# Patient Record
Sex: Male | Born: 1975 | Race: White | Hispanic: No | Marital: Single | Smoking: Never smoker
Health system: Southern US, Community
[De-identification: ages and names within clinical notes are randomized; demographics above are authoritative.]

## PROBLEM LIST (undated history)

## (undated) DIAGNOSIS — F1010 Alcohol abuse, uncomplicated: Secondary | ICD-10-CM

## (undated) DIAGNOSIS — M726 Necrotizing fasciitis: Secondary | ICD-10-CM

## (undated) DIAGNOSIS — K7460 Unspecified cirrhosis of liver: Secondary | ICD-10-CM

---

## 2011-09-23 DEATH — deceased

## 2018-01-05 ENCOUNTER — Inpatient Hospital Stay (HOSPITAL_COMMUNITY): Payer: PRIVATE HEALTH INSURANCE | Admitting: Certified Registered Nurse Anesthetist

## 2018-01-05 ENCOUNTER — Encounter (HOSPITAL_COMMUNITY)
Admission: AD | Disposition: A | Discharge status: Home Health | Payer: Self-pay | Source: Other Acute Inpatient Hospital | Attending: Orthopedic Surgery

## 2018-01-05 ENCOUNTER — Encounter (HOSPITAL_COMMUNITY): Payer: Self-pay | Admitting: Internal Medicine

## 2018-01-05 ENCOUNTER — Inpatient Hospital Stay (HOSPITAL_COMMUNITY)
Admission: AD | Admit: 2018-01-05 | Discharge: 2018-01-25 | DRG: 853 | Disposition: A | Discharge status: Home Health | Payer: PRIVATE HEALTH INSURANCE | Source: Other Acute Inpatient Hospital | Attending: Orthopedic Surgery | Admitting: Orthopedic Surgery

## 2018-01-05 ENCOUNTER — Inpatient Hospital Stay (HOSPITAL_COMMUNITY): Payer: PRIVATE HEALTH INSURANCE

## 2018-01-05 DIAGNOSIS — R1310 Dysphagia, unspecified: Secondary | ICD-10-CM | POA: Diagnosis present

## 2018-01-05 DIAGNOSIS — E43 Unspecified severe protein-calorie malnutrition: Secondary | ICD-10-CM | POA: Diagnosis present

## 2018-01-05 DIAGNOSIS — L97524 Non-pressure chronic ulcer of other part of left foot with necrosis of bone: Secondary | ICD-10-CM | POA: Diagnosis present

## 2018-01-05 DIAGNOSIS — Z833 Family history of diabetes mellitus: Secondary | ICD-10-CM

## 2018-01-05 DIAGNOSIS — Z885 Allergy status to narcotic agent status: Secondary | ICD-10-CM | POA: Diagnosis not present

## 2018-01-05 DIAGNOSIS — A419 Sepsis, unspecified organism: Secondary | ICD-10-CM | POA: Diagnosis present

## 2018-01-05 DIAGNOSIS — D62 Acute posthemorrhagic anemia: Secondary | ICD-10-CM | POA: Diagnosis not present

## 2018-01-05 DIAGNOSIS — F1010 Alcohol abuse, uncomplicated: Secondary | ICD-10-CM | POA: Diagnosis present

## 2018-01-05 DIAGNOSIS — Z881 Allergy status to other antibiotic agents status: Secondary | ICD-10-CM | POA: Diagnosis not present

## 2018-01-05 DIAGNOSIS — M86179 Other acute osteomyelitis, unspecified ankle and foot: Secondary | ICD-10-CM | POA: Diagnosis not present

## 2018-01-05 DIAGNOSIS — M726 Necrotizing fasciitis: Secondary | ICD-10-CM

## 2018-01-05 DIAGNOSIS — Z6826 Body mass index (BMI) 26.0-26.9, adult: Secondary | ICD-10-CM

## 2018-01-05 DIAGNOSIS — L97519 Non-pressure chronic ulcer of other part of right foot with unspecified severity: Secondary | ICD-10-CM | POA: Diagnosis present

## 2018-01-05 DIAGNOSIS — G629 Polyneuropathy, unspecified: Secondary | ICD-10-CM

## 2018-01-05 DIAGNOSIS — M868X7 Other osteomyelitis, ankle and foot: Secondary | ICD-10-CM | POA: Diagnosis present

## 2018-01-05 HISTORY — PX: I & D EXTREMITY: SHX5045

## 2018-01-05 HISTORY — DX: Other acute osteomyelitis, unspecified ankle and foot: M86.179

## 2018-01-05 LAB — COMPREHENSIVE METABOLIC PANEL
ALBUMIN: 2 g/dL — AB (ref 3.5–5.0)
ALK PHOS: 64 U/L (ref 38–126)
ALT: 35 U/L (ref 17–63)
AST: 51 U/L — AB (ref 15–41)
Anion gap: 10 (ref 5–15)
BUN: 17 mg/dL (ref 6–20)
CALCIUM: 7.7 mg/dL — AB (ref 8.9–10.3)
CO2: 26 mmol/L (ref 22–32)
CREATININE: 0.89 mg/dL (ref 0.61–1.24)
Chloride: 98 mmol/L — ABNORMAL LOW (ref 101–111)
GFR calc Af Amer: >60 mL/min (ref >60)
GLUCOSE: 98 mg/dL (ref 65–99)
POTASSIUM: 4 mmol/L (ref 3.5–5.1)
Sodium: 134 mmol/L — ABNORMAL LOW (ref 135–145)
TOTAL PROTEIN: 6.6 g/dL (ref 6.5–8.1)
Total Bilirubin: 1.5 mg/dL — ABNORMAL HIGH (ref 0.3–1.2)

## 2018-01-05 LAB — GLUCOSE, CAPILLARY
GLUCOSE-CAPILLARY: 102 mg/dL — AB (ref 65–99)
Glucose-Capillary: 89 mg/dL (ref 65–99)

## 2018-01-05 LAB — CBC WITH DIFFERENTIAL/PLATELET
BASOS ABS: 0 10*3/uL (ref 0.0–0.1)
Basophils Relative: 0 %
EOS ABS: 0 10*3/uL (ref 0.0–0.7)
Eosinophils Relative: 0 %
HCT: 35 % — ABNORMAL LOW (ref 39.0–52.0)
Hemoglobin: 11.1 g/dL — ABNORMAL LOW (ref 13.0–17.0)
LYMPHS PCT: 6 %
Lymphs Abs: 0.8 10*3/uL (ref 0.7–4.0)
MCH: 32.2 pg (ref 26.0–34.0)
MCHC: 31.7 g/dL (ref 30.0–36.0)
MCV: 101.4 fL — ABNORMAL HIGH (ref 78.0–100.0)
MONO ABS: 0.8 10*3/uL (ref 0.1–1.0)
Monocytes Relative: 6 %
Neutro Abs: 11.5 10*3/uL — ABNORMAL HIGH (ref 1.7–7.7)
Neutrophils Relative %: 88 %
PLATELETS: 149 10*3/uL — AB (ref 150–400)
RBC: 3.45 MIL/uL — AB (ref 4.22–5.81)
RDW: 14 % (ref 11.5–15.5)
WBC: 13.1 10*3/uL — AB (ref 4.0–10.5)

## 2018-01-05 LAB — TYPE AND SCREEN
ABO/RH(D): A POS
Antibody Screen: NEGATIVE

## 2018-01-05 LAB — TSH: TSH: 2.479 u[IU]/mL (ref 0.350–4.500)

## 2018-01-05 LAB — ABO/RH: ABO/RH(D): A POS

## 2018-01-05 LAB — VANCOMYCIN, TROUGH: Vancomycin Tr: 13 ug/mL — ABNORMAL LOW (ref 15–20)

## 2018-01-05 LAB — VITAMIN B12: VITAMIN B 12: 275 pg/mL (ref 180–914)

## 2018-01-05 SURGERY — IRRIGATION AND DEBRIDEMENT EXTREMITY
Anesthesia: General | Laterality: Left

## 2018-01-05 MED ORDER — LIDOCAINE 2% (20 MG/ML) 5 ML SYRINGE
INTRAMUSCULAR | Status: DC | PRN
  Administered 2018-01-05: 100 mg via INTRAVENOUS

## 2018-01-05 MED ORDER — PHENYLEPHRINE HCL 10 MG/ML IJ SOLN
INTRAMUSCULAR | Status: DC | PRN
  Administered 2018-01-05 (×2): 120 ug via INTRAVENOUS
  Administered 2018-01-05 (×4): 80 ug via INTRAVENOUS
  Administered 2018-01-05: 120 ug via INTRAVENOUS
  Administered 2018-01-05 (×2): 80 ug via INTRAVENOUS

## 2018-01-05 MED ORDER — ALBUMIN HUMAN 5 % IV SOLN
INTRAVENOUS | Status: AC
  Administered 2018-01-05: 12.5 g
  Filled 2018-01-05: qty 250

## 2018-01-05 MED ORDER — SODIUM CHLORIDE 0.9 % IR SOLN
Status: DC | PRN
  Administered 2018-01-05: 9000 mL

## 2018-01-05 MED ORDER — MORPHINE SULFATE (PF) 2 MG/ML IV SOLN
2.0000 mg | INTRAVENOUS | Status: DC | PRN
  Administered 2018-01-05 – 2018-01-08 (×11): 2 mg via INTRAVENOUS
  Filled 2018-01-05 (×11): qty 1

## 2018-01-05 MED ORDER — TETANUS-DIPHTH-ACELL PERTUSSIS 5-2.5-18.5 LF-MCG/0.5 IM SUSP
0.5000 mL | Freq: Once | INTRAMUSCULAR | Status: AC
  Administered 2018-01-05: 0.5 mL via INTRAMUSCULAR
  Filled 2018-01-05: qty 0.5

## 2018-01-05 MED ORDER — ACETAMINOPHEN 325 MG PO TABS
650.0000 mg | ORAL_TABLET | Freq: Four times a day (QID) | ORAL | Status: DC | PRN
  Administered 2018-01-05: 650 mg via ORAL
  Filled 2018-01-05: qty 2

## 2018-01-05 MED ORDER — ONDANSETRON HCL 4 MG/2ML IJ SOLN: 4.0000 mg | Freq: Four times a day (QID) | INTRAMUSCULAR | Status: DC | PRN

## 2018-01-05 MED ORDER — SODIUM CHLORIDE 0.9 % IV SOLN
INTRAVENOUS | Status: AC
  Administered 2018-01-05: 07:00:00 via INTRAVENOUS

## 2018-01-05 MED ORDER — OXYCODONE HCL 5 MG PO TABS
5.0000 mg | ORAL_TABLET | ORAL | Status: DC | PRN
  Administered 2018-01-05: 5 mg via ORAL

## 2018-01-05 MED ORDER — PHENYLEPHRINE HCL 10 MG/ML IJ SOLN
INTRAVENOUS | Status: DC | PRN
  Administered 2018-01-05: 25 ug/min via INTRAVENOUS

## 2018-01-05 MED ORDER — ONDANSETRON HCL 4 MG PO TABS: 4.0000 mg | ORAL_TABLET | Freq: Four times a day (QID) | ORAL | Status: DC | PRN

## 2018-01-05 MED ORDER — HYDROCODONE-ACETAMINOPHEN 5-325 MG PO TABS
1.0000 | ORAL_TABLET | ORAL | Status: DC | PRN
  Administered 2018-01-05: 1 via ORAL
  Administered 2018-01-07: 2 via ORAL
  Filled 2018-01-05 (×2): qty 2
  Filled 2018-01-05: qty 1

## 2018-01-05 MED ORDER — ACETAMINOPHEN 325 MG PO TABS
650.0000 mg | ORAL_TABLET | Freq: Four times a day (QID) | ORAL | Status: DC | PRN
  Administered 2018-01-07: 650 mg via ORAL
  Filled 2018-01-05: qty 2

## 2018-01-05 MED ORDER — EPHEDRINE SULFATE 50 MG/ML IJ SOLN
INTRAMUSCULAR | Status: DC | PRN
  Administered 2018-01-05 (×2): 5 mg via INTRAVENOUS

## 2018-01-05 MED ORDER — PROMETHAZINE HCL 25 MG/ML IJ SOLN: 6.2500 mg | INTRAMUSCULAR | Status: DC | PRN

## 2018-01-05 MED ORDER — POVIDONE-IODINE 10 % EX SWAB: 2.0000 "application " | Freq: Once | CUTANEOUS | Status: DC

## 2018-01-05 MED ORDER — FENTANYL CITRATE (PF) 100 MCG/2ML IJ SOLN
INTRAMUSCULAR | Status: AC
  Filled 2018-01-05: qty 2

## 2018-01-05 MED ORDER — ALBUMIN HUMAN 5 % IV SOLN: 12.5000 g | Freq: Once | INTRAVENOUS | Status: DC

## 2018-01-05 MED ORDER — FENTANYL CITRATE (PF) 250 MCG/5ML IJ SOLN
INTRAMUSCULAR | Status: AC
  Filled 2018-01-05: qty 5

## 2018-01-05 MED ORDER — LACTATED RINGERS IV SOLN
INTRAVENOUS | Status: DC | PRN
  Administered 2018-01-05 (×2): via INTRAVENOUS

## 2018-01-05 MED ORDER — OXYCODONE HCL 5 MG PO TABS
ORAL_TABLET | ORAL | Status: AC
  Filled 2018-01-05: qty 1

## 2018-01-05 MED ORDER — PIPERACILLIN-TAZOBACTAM 3.375 G IVPB
3.3750 g | Freq: Three times a day (TID) | INTRAVENOUS | Status: AC
  Administered 2018-01-05 – 2018-01-24 (×60): 3.375 g via INTRAVENOUS
  Filled 2018-01-05 (×61): qty 50

## 2018-01-05 MED ORDER — FENTANYL CITRATE (PF) 250 MCG/5ML IJ SOLN
INTRAMUSCULAR | Status: DC | PRN
  Administered 2018-01-05 (×2): 25 ug via INTRAVENOUS
  Administered 2018-01-05 (×2): 50 ug via INTRAVENOUS
  Administered 2018-01-05: 100 ug via INTRAVENOUS

## 2018-01-05 MED ORDER — DIPHENHYDRAMINE HCL 12.5 MG/5ML PO ELIX
25.0000 mg | ORAL_SOLUTION | Freq: Every day | ORAL | Status: DC | PRN
  Administered 2018-01-05: 25 mg via ORAL
  Filled 2018-01-05: qty 10

## 2018-01-05 MED ORDER — GABAPENTIN 100 MG PO CAPS
100.0000 mg | ORAL_CAPSULE | Freq: Three times a day (TID) | ORAL | Status: DC
  Administered 2018-01-05 – 2018-01-06 (×3): 100 mg via ORAL
  Filled 2018-01-05 (×3): qty 1

## 2018-01-05 MED ORDER — PROPOFOL 10 MG/ML IV BOLUS
INTRAVENOUS | Status: AC
  Filled 2018-01-05: qty 20

## 2018-01-05 MED ORDER — FENTANYL CITRATE (PF) 100 MCG/2ML IJ SOLN
25.0000 ug | INTRAMUSCULAR | Status: DC | PRN
  Administered 2018-01-05 (×2): 50 ug via INTRAVENOUS

## 2018-01-05 MED ORDER — VANCOMYCIN HCL IN DEXTROSE 1-5 GM/200ML-% IV SOLN
1000.0000 mg | Freq: Three times a day (TID) | INTRAVENOUS | Status: DC
  Administered 2018-01-05 (×3): 1000 mg via INTRAVENOUS
  Filled 2018-01-05 (×4): qty 200

## 2018-01-05 MED ORDER — CYANOCOBALAMIN 1000 MCG/ML IJ SOLN
1000.0000 ug | Freq: Every day | INTRAMUSCULAR | Status: AC
  Administered 2018-01-05 – 2018-01-08 (×3): 1000 ug via INTRAMUSCULAR
  Filled 2018-01-05 (×6): qty 1

## 2018-01-05 MED ORDER — PROPOFOL 10 MG/ML IV BOLUS
INTRAVENOUS | Status: DC | PRN
  Administered 2018-01-05: 200 mg via INTRAVENOUS

## 2018-01-05 MED ORDER — MIDAZOLAM HCL 2 MG/2ML IJ SOLN
INTRAMUSCULAR | Status: AC
  Filled 2018-01-05: qty 2

## 2018-01-05 MED ORDER — ACETAMINOPHEN 650 MG RE SUPP
650.0000 mg | RECTAL | Status: DC | PRN
  Filled 2018-01-05: qty 1

## 2018-01-05 MED ORDER — ONDANSETRON HCL 4 MG/2ML IJ SOLN
INTRAMUSCULAR | Status: DC | PRN
  Administered 2018-01-05: 4 mg via INTRAVENOUS

## 2018-01-05 MED ORDER — 0.9 % SODIUM CHLORIDE (POUR BTL) OPTIME
TOPICAL | Status: DC | PRN
  Administered 2018-01-05: 1000 mL

## 2018-01-05 MED ORDER — CHLORHEXIDINE GLUCONATE 4 % EX LIQD: 60.0000 mL | Freq: Once | CUTANEOUS | Status: DC

## 2018-01-05 MED ORDER — ACETAMINOPHEN 650 MG RE SUPP: 650.0000 mg | Freq: Four times a day (QID) | RECTAL | Status: DC | PRN

## 2018-01-05 MED ORDER — MIDAZOLAM HCL 2 MG/2ML IJ SOLN
INTRAMUSCULAR | Status: DC | PRN
  Administered 2018-01-05: 2 mg via INTRAVENOUS

## 2018-01-05 MED ORDER — VANCOMYCIN HCL 10 G IV SOLR
1250.0000 mg | Freq: Three times a day (TID) | INTRAVENOUS | Status: DC
  Administered 2018-01-06 – 2018-01-10 (×13): 1250 mg via INTRAVENOUS
  Filled 2018-01-05 (×16): qty 1250

## 2018-01-05 MED ORDER — IOPAMIDOL (ISOVUE-300) INJECTION 61%
INTRAVENOUS | Status: AC
  Administered 2018-01-05: 75 mL
  Filled 2018-01-05: qty 75

## 2018-01-05 SURGICAL SUPPLY — 47 items
BNDG COHESIVE 4X5 TAN STRL (GAUZE/BANDAGES/DRESSINGS) IMPLANT
BNDG ELASTIC 6X10 VLCR STRL LF (GAUZE/BANDAGES/DRESSINGS) ×3 IMPLANT
BNDG GAUZE ELAST 4 BULKY (GAUZE/BANDAGES/DRESSINGS) IMPLANT
BRUSH SCRUB SURG 4.25 DISP (MISCELLANEOUS) IMPLANT
CANISTER WOUND CARE 500ML ATS (WOUND CARE) ×3 IMPLANT
CHLORAPREP W/TINT 26ML (MISCELLANEOUS) ×6 IMPLANT
COVER MAYO STAND STRL (DRAPES) IMPLANT
COVER SURGICAL LIGHT HANDLE (MISCELLANEOUS) ×3 IMPLANT
DRAPE ORTHO SPLIT 77X108 STRL (DRAPES) ×2
DRAPE SURG 17X23 STRL (DRAPES) ×3 IMPLANT
DRAPE SURG ORHT 6 SPLT 77X108 (DRAPES) ×1 IMPLANT
DRAPE U-SHAPE 47X51 STRL (DRAPES) ×3 IMPLANT
DRSG ADAPTIC 3X8 NADH LF (GAUZE/BANDAGES/DRESSINGS) IMPLANT
DRSG VAC ATS LRG SENSATRAC (GAUZE/BANDAGES/DRESSINGS) ×3 IMPLANT
ELECT REM PT RETURN 9FT ADLT (ELECTROSURGICAL)
ELECTRODE REM PT RTRN 9FT ADLT (ELECTROSURGICAL) IMPLANT
EVACUATOR 1/8 PVC DRAIN (DRAIN) IMPLANT
GAUZE SPONGE 4X4 12PLY STRL (GAUZE/BANDAGES/DRESSINGS) IMPLANT
GLOVE BIO SURGEON STRL SZ7.5 (GLOVE) ×12 IMPLANT
GLOVE BIOGEL PI IND STRL 7.5 (GLOVE) ×1 IMPLANT
GLOVE BIOGEL PI INDICATOR 7.5 (GLOVE) ×2
GOWN STRL REUS W/ TWL LRG LVL3 (GOWN DISPOSABLE) ×2 IMPLANT
GOWN STRL REUS W/TWL LRG LVL3 (GOWN DISPOSABLE) ×4
HANDPIECE INTERPULSE COAX TIP (DISPOSABLE)
KIT BASIN OR (CUSTOM PROCEDURE TRAY) ×3 IMPLANT
KIT ROOM TURNOVER OR (KITS) ×3 IMPLANT
MANIFOLD NEPTUNE II (INSTRUMENTS) ×3 IMPLANT
NS IRRIG 1000ML POUR BTL (IV SOLUTION) ×3 IMPLANT
PACK ORTHO EXTREMITY (CUSTOM PROCEDURE TRAY) ×3 IMPLANT
PAD ARMBOARD 7.5X6 YLW CONV (MISCELLANEOUS) ×6 IMPLANT
PADDING CAST COTTON 6X4 STRL (CAST SUPPLIES) IMPLANT
SET HNDPC FAN SPRY TIP SCT (DISPOSABLE) IMPLANT
SPONGE LAP 18X18 5 PK (GAUZE/BANDAGES/DRESSINGS) ×12 IMPLANT
SPONGE LAP 18X18 X RAY DECT (DISPOSABLE) ×9 IMPLANT
SUT ETHILON 2 0 FS 18 (SUTURE) IMPLANT
SUT ETHILON 2 0 PSLX (SUTURE) ×6 IMPLANT
SUT ETHILON 3 0 PS 1 (SUTURE) IMPLANT
SUT MON AB 2-0 CT1 36 (SUTURE) IMPLANT
SUT PDS AB 0 CT 36 (SUTURE) IMPLANT
SWAB CULTURE ESWAB REG 1ML (MISCELLANEOUS) IMPLANT
TOWEL OR 17X24 6PK STRL BLUE (TOWEL DISPOSABLE) ×3 IMPLANT
TOWEL OR 17X26 10 PK STRL BLUE (TOWEL DISPOSABLE) ×6 IMPLANT
TUBE CONNECTING 12'X1/4 (SUCTIONS) ×1
TUBE CONNECTING 12X1/4 (SUCTIONS) ×2 IMPLANT
UNDERPAD 30X30 (UNDERPADS AND DIAPERS) ×3 IMPLANT
WATER STERILE IRR 1000ML POUR (IV SOLUTION) IMPLANT
YANKAUER SUCT BULB TIP NO VENT (SUCTIONS) ×3 IMPLANT

## 2018-01-05 NOTE — Progress Notes (Signed)
seens examined In addition  42 yr old with habitual ETOH, ?neuropathy nos Dysphagia [facility] Nec Fasciitis Went for surgery today and will be going again probably on Monday 3/18  Awake alert pleasant no distres Wound va cin place s1 s 2no m/r/g abd soft nt nd no rebound no guard   P Neuropathy ? Related to habitual ETOH--will dose with high dose b12, start gabapentin 100 tid and observe He also c/o gagging sensation in the throat--if persists will order barium esophogram and evalauate for stricture-d/c Dr. Barbaraann BarthelHaddix--if stable and primarily Ortho issues, would follow as consult with ortho being primary--appreciate input  Pleas KochJai Reichen Hutzler, MD [facility] [phone]) [phone]

## 2018-01-05 NOTE — Care Management Note (Addendum)
Case Management Note  Patient Details  Name: Aaron Mcconnell MRN: [id number] Date of Birth: 10/13/1976  Subjective/Objective:                 Patient from home, transferred from another facility, lives alone, works full time and independent. Extensive I&D to LLE, with more surgery possible per patient. Has wound VAC. No DME at home. Anticipate DME and HH needs. Patient had appointment with [facility]" to establish PCP. He states they also make house calls.  [place] in [place], [place].  Patient has Rx coverage and [employer] care.   Action/Plan:  CM will continue to follow.   Expected Discharge Date:                  Expected Discharge Plan:  Home w Home Health Services  In-House Referral:     Discharge planning Services  CM Consult  Post Acute Care Choice:    Choice offered to:     DME Arranged:    DME Agency:     HH Arranged:    HH Agency:     Status of Service:  In process, will continue to follow  If discussed at Long Length of Stay Meetings, dates discussed:    Additional Comments:  Aaron SabalDebbie Hansika Leaming, RN 01/05/2018, 3:22 PM

## 2018-01-05 NOTE — H&P (Signed)
History and Physical    Aaron Mcconnell WJX:[id number] DOB: [birth year] DOA: 01/05/2018  PCP: Patient, No Pcp Per  Patient coming from: Transferred from [facility].  Chief Complaint: Bilateral foot wound.  HPI: Aaron Mcconnell is a 42 y.o. male with no significant past medical history he started developing wound on his foot over the last 3-4 weeks.  Had mild discharge but became more acute over the last 24 hours with fever and chills.  Patient started to notice bloody discharge from the foot.  Patient also has been having ulceration on the both feet.  Patient otherwise denies any chest pain shortness of breath productive cough nausea vomiting or diarrhea.  Patient had gone to [facility] ER and over the x-rays done of both feet shows features concerning for osteomyelitis of the left foot second toe and bilateral necrotizing fasciitis.  Labs also show leukocytosis.  Hemoglobin A1c was 5.5.  Blood cultures were obtained.  Patient was started empirically on vancomycin and Zosyn.  Dr. Jena Mcconnell on-call orthopedic surgeon at [facility] was consulted and patient was accepted to the hospital.  On my exam patient looks only redressed up and patient's heart rate is around 90 blood pressure is more than 100 systolic patient is to be mildly febrile.  ED Course: Patient was a direct admit.  Review of Systems: As per HPI, rest all negative.   History reviewed. No pertinent past medical history.  History reviewed. No pertinent surgical history.   reports that  has never smoked. he has never used smokeless tobacco. He reports that he drinks alcohol. He reports that he does not use drugs.  Not on File  Family History  Problem Relation Age of Onset  . Diabetes Mellitus II Mother     Prior to Admission medications   Not on File    Physical Exam: Vitals:   01/05/18 0415  BP: 120/81  Pulse: 91  Resp: 18  Temp: 100.2 F (37.9 C)  TempSrc: Oral  SpO2: 99%      Constitutional:  Moderately built and nourished. Vitals:   01/05/18 0415  BP: 120/81  Pulse: 91  Resp: 18  Temp: 100.2 F (37.9 C)  TempSrc: Oral  SpO2: 99%   Eyes: Anicteric no pallor. ENMT: No discharge from the ears eyes nose or mouth. Neck: No mass felt.  No neck rigidity. Respiratory: No rhonchi or crepitations. Cardiovascular: S1-S2 heard no murmurs appreciated. Abdomen: Soft nontender bowel sounds present. Musculoskeletal: Swelling in the left ankle area with mild erythema.  Both feet are dressed. Skin: Mild erythema of the left ankle area. Neurologic: Alert awake oriented to time place and person.  Moves all extremities. Psychiatric: Appears normal.   Labs on Admission: I have personally reviewed following labs and imaging studies  CBC: No results for input(s): WBC, NEUTROABS, HGB, HCT, MCV, PLT in the last 168 hours. Basic Metabolic Panel: No results for input(s): NA, K, CL, CO2, GLUCOSE, BUN, CREATININE, CALCIUM, MG, PHOS in the last 168 hours. GFR: CrCl cannot be calculated (No order found.). Liver Function Tests: No results for input(s): AST, ALT, ALKPHOS, BILITOT, PROT, ALBUMIN in the last 168 hours. No results for input(s): LIPASE, AMYLASE in the last 168 hours. No results for input(s): AMMONIA in the last 168 hours. Coagulation Profile: No results for input(s): INR, PROTIME in the last 168 hours. Cardiac Enzymes: No results for input(s): CKTOTAL, CKMB, CKMBINDEX, TROPONINI in the last 168 hours. BNP (last 3 results) No results for input(s): PROBNP in the  last 8760 hours. HbA1C: No results for input(s): HGBA1C in the last 72 hours. CBG: No results for input(s): GLUCAP in the last 168 hours. Lipid Profile: No results for input(s): CHOL, HDL, LDLCALC, TRIG, CHOLHDL, LDLDIRECT in the last 72 hours. Thyroid Function Tests: No results for input(s): TSH, T4TOTAL, FREET4, T3FREE, THYROIDAB in the last 72 hours. Anemia Panel: No results for input(s): VITAMINB12, FOLATE,  FERRITIN, TIBC, IRON, RETICCTPCT in the last 72 hours. Urine analysis: No results found for: COLORURINE, APPEARANCEUR, LABSPEC, PHURINE, GLUCOSEU, HGBUR, BILIRUBINUR, KETONESUR, PROTEINUR, UROBILINOGEN, NITRITE, LEUKOCYTESUR Sepsis Labs: @LABRCNTIP (procalcitonin:4,lacticidven:4) )No results found for this or any previous visit (from the past 240 hour(s)).   Radiological Exams on Admission: No results found.    Assessment/Plan Principal Problem:   Sepsis (HCC) Active Problems:   Osteomyelitis of ankle or foot, acute, unspecified laterality (HCC)    1. Sepsis with osteomyelitis of the feet with possible necrotizing fasciitis -discussed with Dr. Jena GaussHaddix on call orthopedic surgeon, who will be seeing patient in consult.  Patient is kept n.p.o. in anticipation of possible procedure.  Patient is continued on vancomycin and Zosyn IV fluids Tylenol rectally,morphine for pain relief.  [facility] and will need to get results from there.  Labs done over here are still pending.  Patient does not recall his last shot which has been ordered now.  I have extensively reviewed [facility]'s [facility].  Discussed with on-call orthopedic surgeon.   DVT prophylaxis: For now patient has wounds on both legs unable to place SCDs and no Lovenox in anticipation of surgery. Code Status: Full code. Family Communication: Patient's mother. Disposition Plan: Home. Consults called: Orthopedics. Admission status: Inpatient.   Aaron ClosArshad N Gavan Nordby MD [facility] Pager [phone].  If 7PM-7AM, please contact night-coverage [email or web address] Password [email or web address]  01/05/2018, 7:21 AM

## 2018-01-05 NOTE — Progress Notes (Signed)
[facility] has [facility] along with his mother at 0410 this morning.  Patient's mother is asking for packet that she claim the transporters had during the travel.  Packet was not provided.  Particia Nearingontacted Jill, RN at [facility] [place] - [phone].  Information will be faxed to [facility]N this morning.  Patient and his mother is concerned regarding insurance not paying for any duplicate services.  Patient had blood work, chest x ray, etc done [facility].  They do not want to duplicate these test [facility] at [facility].  Contacting on-MD.

## 2018-01-05 NOTE — H&P (View-Only) (Signed)
Orthopaedic Trauma Service (OTS) Consult   Patient ID: Aaron Mcconnell MRN: [id number] DOB/AGE: [birth year] 42 y.o.  Reason for Consult:Left leg infection Referring Physician: Dr. Midge Minium, MD [facility]  HPI: Aaron Mcconnell is an 42 y.o. male who is being seen in consultation at the request of Dr. Toniann Fail for evaluation of left leg infection.  This is a otherwise healthy male with no medical history but he has had wounds and ulcers on both of his legs for multiple weeks.  Over the last few days he has noted increased bloody discharge from the lateral side of the foot.  He developed a fever and chills.  He was brought to [facility] rocking him and x-rays show possible osteomyelitis and bilateral necrotizing fasciitis with gas.  I was consulted and requested admission to hospitalist service.  He only arrived a few hours ago.  The patient is mildly febrile.  He is not hypotensive and he is not tachycardic currently.  He is on right side has an ulcer over his third metatarsal head but without any infection findings.  History reviewed. No pertinent past medical history.  History reviewed. No pertinent surgical history.  Family History  Problem Relation Age of Onset  . Diabetes Mellitus II Mother     Social History:  reports that  has never smoked. he has never used smokeless tobacco. He reports that he drinks alcohol. He reports that he does not use drugs.  Allergies: Not on File  Medications:  No current facility-administered medications on file prior to encounter.    No current outpatient medications on file prior to encounter.    ROS: Constitutional: +Fever Vision: No changes in vision ENT: No difficulty swallowing CV: No chest pain Pulm: No SOB or wheezing GI: No nausea or vomiting GU: No urgency or inability to hold urine Skin: No poor wound healing Neurologic: No numbness or tingling Psychiatric: No depression or anxiety Heme: No bruising Allergic: No  reaction to medications or food   Exam: Blood pressure 120/81, pulse 91, temperature 100.2 F (37.9 C), temperature source Oral, resp. rate 18, SpO2 99 %. General: No acute distress Orientation: Awake alert and oriented x3 Mood and Affect: Cooperative and pleasant Gait: Not able to be assessed due to his infection Coordination and balance: Within normal limits  Left lower extremity: Reveals a full-thickness ulcer on his medial side of his calcaneus.  It tracks to bone.  Laterally there is a significant blistering and bullae that was debrided at bedside.  There is foul-smelling purulent drainage with necrotic tissue that tracks all the way to bone.  There is palpable crepitus consistent with soft tissue gas in his lower extremity that extends up to about mid calf.  He has some swelling without significant erythema on his lower leg.  He endorses sensation but it is diminished.  He has an ulcer on his second toe on the left side.  He has 2+ DP pulses.  No instability of the leg  Right lower extremity: Reveals healthy-appearing ulcer on the base of his third metatarsal head.  There does not track to bone from my view.  He has 2+ DP pulses.  Sensation is diminished.  No signs of cellulitis or infection.     Medical Decision Making: Imaging: I am unable to pull up his films from outside.  However I ordered a stat CT scan of his leg to evaluate the involvement of the soft tissue and bone.  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  He is clinical exam is concerning regarding the involvement around his calcaneus.  I will plan to perform an urgent irrigation and debridement with wound VAC placement.  I discussed with him the possibility needing a below-knee amputation in the near future as the soft tissue involvement is extensive.  He currently is a leukocytosis and febrile but he has stable heart rate and blood pressure.  Continue broad-spectrum antibiotics I will take cultures in the operating room and further evaluate care after that moment.  Risks and benefits were discussed with the patient and his mother.  We will plan to proceed with surgery this morning.   Roby LoftsKevin P. Satomi Buda, MD [facility] [phone] (phone)

## 2018-01-05 NOTE — Op Note (Signed)
OrthopaedicSurgeryOperativeNote (ZOX:[id number]:[id number]) Date of Surgery: 01/05/2018  Admit Date: 01/05/2018   Diagnoses: Pre-Op Diagnoses: Left leg necrotizing fascitis  Post-Op Diagnosis: Same  Procedures: 1. CPT 27894-Decompression and extensive debridement of left lower leg 2. CPT 97605-Wound vac placement  Surgeons: Primary: Haddix, Gillie MannersKevin P, MD   Location:[facility] OR ROOM 03   AnesthesiaGeneral   Antibiotics:Vanc and Zosyn on floor  Tourniquettime:None  EstimatedBloodLoss:26750mL   Complications:* No complications entered in OR log *  Specimens: ID Type Source Tests Collected by Time Destination  A : necrotizing Fascitis Tissue Leg, Left GRAM STAIN, AEROBIC/ANAEROBIC CULTURE (SURGICAL/DEEP WOUND) Haddix, Gillie MannersKevin P, MD 01/05/2018 1100     Implants: * No implants in log *  IndicationsforSurgery: This is a 42 year old male with no known medical history who has had chronic ulcers on bilateral lower extremities for the last few months.  Over the last 2 days he has had worsening pain and swelling on his left leg.  He developed a blister yesterday morning and is progressed significantly.  He has had drainage and a foul-smelling odor.  He went to the emergency room where they evaluated him and x-rays showed gas in the subcutaneous tissue tracking up into his lower leg he was transferred overnight for evaluation.  I evaluated and felt that he had developing necrotizing fasciitis.  I discussed with him the risks and benefits of proceeding with urgent irrigation and debridement and possible amputation.  He was adamant about refusing an amputation right away.  I discussed with him the risks and benefits of surgery and the dire situation of his infection.  The patient agreed to proceed with surgery and consent was obtained.  Operative Findings: Foul-smelling necrotic wound over the lateral heel that was debrided down to expose calcaneus.  There was dishwater pus that tracked through his  fascial planes between the deep posterior and superficial posterior as well as his lateral and anterior compartments all the way up to his knee.  There was notable necrotic muscle in his deep posterior and his lateral compartment.  This was debrided thoroughly.  Wound VAC placement.  Procedure: The patient was identified in the preoperative holding area. Consent was confirmed with the patient and their family and all questions were answered. The operative extremity was marked after confirmation with the patient. he was then brought back to the operating room by our anesthesia colleagues.  He was carefully transferred over to a regular OR table.  Here a bump was placed under his operative hip.  He was placed under general anesthetic. The operative extremity was then prepped and draped in usual sterile fashion. A preoperative timeout was performed to verify the patient, the procedure, and the extremity.  I first started out with the necrotic area around his lateral heel.  Using a scalpel excisionally debrided the skin and subcutaneous tissue debriding the entirety of the necrotic tissue and I sent this for culture.  This tracked down to the calcaneus.  There was a tract from his lateral wound to a chronic ulcer on the medial side.  I used a curette, ronguer and scissors to excisionally debride the necrotic tissue down to healthy bleeding base.  I excised the unhealthy skin around the the wound.  I then incised in a posterior lateral incision all the way up to his fibular head.  Here I encountered the soft tissue and fascial planes were disrupted by the infection.  I encountered dishwater pus in between the fascial planes.  The skin lifted off from the underlying fascia very  easily.  There was a necrotic muscle visible in the posterior deep compartment as well as the lateral compartment.  I debrided some of the peroneal musculature as well as some deep flexor musculature.  I was able to get back to a decent  muscular base with no more necrotic tissue.  I did use cautery to identify that the remaining muscle was contractile.  I cauterized all the bleeders.  I then thoroughly irrigated the wound with 9 L of normal saline.  I then changed instruments and gloves and then proceeded to place a wound VAC with retention sutures on the skin.  I connected this to 125 mmHg and wrapped his leg with an Ace wrap.  I got a good seal.  The patient was then awoken from anesthesia and taken to the PACU in stable condition.  Post Op Plan/Instructions: The patient will be nonweightbearing to the left lower extremity.  We will keep his wound VAC in place.  He will be on broad-spectrum antibiotics.  Will return to the operating room on Monday for repeat washout and debridement.  He will be on aspirin for DVT prophylaxis.  I was present and performed the entire surgery.  Truitt Merle, MD [facility]

## 2018-01-05 NOTE — Progress Notes (Signed)
Pt wound vac is full and is no longer suctioning, RN called OR and tried to get a canister but the OR stated they only had two left and could not give us one. [facility] she said she will call us back. RN called and notified MD

## 2018-01-05 NOTE — Anesthesia Preprocedure Evaluation (Addendum)
Anesthesia Evaluation  Patient identified by MRN, date of birth, ID band Patient awake  General Assessment Comment:Allergies: Erythromycin  Reviewed: Allergy & Precautions, NPO status , Patient's Chart, lab work & pertinent test results  Airway Mallampati: II  TM Distance: >3 FB Neck ROM: Full    Dental  (+) Teeth Intact, Dental Advisory Given   Pulmonary neg pulmonary ROS,    Pulmonary exam normal breath sounds clear to auscultation       Cardiovascular Exercise Tolerance: Good negative cardio ROS Normal cardiovascular exam Rhythm:Regular Rate:Normal     Neuro/Psych negative neurological ROS  negative psych ROS   GI/Hepatic negative GI ROS, Neg liver ROS,   Endo/Other  negative endocrine ROS  Renal/GU negative Renal ROS     Musculoskeletal negative musculoskeletal ROS (+) Left leg infection    Abdominal   Peds  Hematology  (+) Blood dyscrasia (Thrombocytopenia), anemia ,   Anesthesia Other Findings Day of surgery medications reviewed with the patient.  Reproductive/Obstetrics                            Anesthesia Physical Anesthesia Plan  ASA: I  Anesthesia Plan: General   Post-op Pain Management:    Induction: Intravenous  PONV Risk Score and Plan: 2 and Dexamethasone and Ondansetron  Airway Management Planned: LMA  Additional Equipment:   Intra-op Plan:   Post-operative Plan: Extubation in OR  Informed Consent: I have reviewed the patients History and Physical, chart, labs and discussed the procedure including the risks, benefits and alternatives for the proposed anesthesia with the patient or authorized representative who has indicated his/her understanding and acceptance.   Dental advisory given  Plan Discussed with: CRNA  Anesthesia Plan Comments:         Anesthesia Quick Evaluation

## 2018-01-05 NOTE — Consult Note (Signed)
Orthopaedic Trauma Service (OTS) Consult   Patient ID: Aaron Mcconnell MRN: [id number] DOB/AGE: 12/25/1975 42 y.o.  Reason for Consult:Left leg infection Referring Physician: Dr. Arshad Kakrakandy, MD [facility]  HPI: Aaron Mcconnell is an 42 y.o. male who is being seen in consultation at the request of Dr. Kakrakandy for evaluation of left leg infection.  This is a otherwise healthy male with no medical history but he has had wounds and ulcers on both of his legs for multiple weeks.  Over the last few days he has noted increased bloody discharge from the lateral side of the foot.  He developed a fever and chills.  He was brought to [facility] rocking him and x-rays show possible osteomyelitis and bilateral necrotizing fasciitis with gas.  I was consulted and requested admission to hospitalist service.  He only arrived a few hours ago.  The patient is mildly febrile.  He is not hypotensive and he is not tachycardic currently.  He is on right side has an ulcer over his third metatarsal head but without any infection findings.  History reviewed. No pertinent past medical history.  History reviewed. No pertinent surgical history.  Family History  Problem Relation Age of Onset  . Diabetes Mellitus II Mother     Social History:  reports that  has never smoked. he has never used smokeless tobacco. He reports that he drinks alcohol. He reports that he does not use drugs.  Allergies: Not on File  Medications:  No current facility-administered medications on file prior to encounter.    No current outpatient medications on file prior to encounter.    ROS: Constitutional: +Fever Vision: No changes in vision ENT: No difficulty swallowing CV: No chest pain Pulm: No SOB or wheezing GI: No nausea or vomiting GU: No urgency or inability to hold urine Skin: No poor wound healing Neurologic: No numbness or tingling Psychiatric: No depression or anxiety Heme: No bruising Allergic: No  reaction to medications or food   Exam: Blood pressure 120/81, pulse 91, temperature 100.2 F (37.9 C), temperature source Oral, resp. rate 18, SpO2 99 %. General: No acute distress Orientation: Awake alert and oriented x3 Mood and Affect: Cooperative and pleasant Gait: Not able to be assessed due to his infection Coordination and balance: Within normal limits  Left lower extremity: Reveals a full-thickness ulcer on his medial side of his calcaneus.  It tracks to bone.  Laterally there is a significant blistering and bullae that was debrided at bedside.  There is foul-smelling purulent drainage with necrotic tissue that tracks all the way to bone.  There is palpable crepitus consistent with soft tissue gas in his lower extremity that extends up to about mid calf.  He has some swelling without significant erythema on his lower leg.  He endorses sensation but it is diminished.  He has an ulcer on his second toe on the left side.  He has 2+ DP pulses.  No instability of the leg  Right lower extremity: Reveals healthy-appearing ulcer on the base of his third metatarsal head.  There does not track to bone from my view.  He has 2+ DP pulses.  Sensation is diminished.  No signs of cellulitis or infection.     Medical Decision Making: Imaging: I am unable to pull up his films from outside.  However I ordered a stat CT scan of his leg to evaluate the involvement of the soft tissue and bone.  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  He is clinical exam is concerning regarding the involvement around his calcaneus.  I will plan to perform an urgent irrigation and debridement with wound VAC placement.  I discussed with him the possibility needing a below-knee amputation in the near future as the soft tissue involvement is extensive.  He currently is a leukocytosis and febrile but he has stable heart rate and blood pressure.  Continue broad-spectrum antibiotics I will take cultures in the operating room and further evaluate care after that moment.  Risks and benefits were discussed with the patient and his mother.  We will plan to proceed with surgery this morning.   Averyana Pillars P. Zymiere Trostle, MD [facility] [phone] (phone)   

## 2018-01-05 NOTE — Progress Notes (Signed)
Pharmacy Antibiotic Note  Aaron Mcconnell is a 42 y.o. male admitted on 01/05/2018 with sepsis.  Pharmacy has been consulted for Vancomycin/Zosyn dosing. [facility] Vancomycin 1000 mg IV x 1, Zosyn 3.375g IV x 1, and Clindamycin 900 mg IV x 1. Selected labs below.   3/16: pt is now s/p OR for I&D of extensive lower extremity necrotizing fascitis, vancomycin trough is sub-therapeutic at 13 this PM, would like higher end of range in setting of nec fasc.   Plan: Inc vancomycin to 1250 mg IV q8h Re-check trough prior to 4th dose of this regimen    Weight: ~99 kg (per pt)  Temp (24hrs), Avg:98.4 F (36.9 C), Min:97.4 F (36.3 C), Max:100.2 F (37.9 C)  Recent Labs  Lab 01/05/18 0628 01/05/18 2145  WBC 13.1*  --   CREATININE 0.89  --   VANCOTROUGH  --  13*    CrCl cannot be calculated (Unknown ideal weight.).    Allergies  Allergen Reactions  . Erythromycin Itching  . Oxycodone Itching    Aaron Mcconnell, Aaron Mcconnell 01/05/2018 10:27 PM

## 2018-01-05 NOTE — Progress Notes (Signed)
MD told RN to order Hydrocodone 5-10mg  Q4 Prn

## 2018-01-05 NOTE — Anesthesia Procedure Notes (Signed)
Procedure Name: LMA Insertion Date/Time: 01/05/2018 10:32 AM Performed by: Dairl PonderJiang, Katty Fretwell, CRNA Pre-anesthesia Checklist: Patient identified, Emergency Drugs available, Suction available, Patient being monitored and Timeout performed Patient Re-evaluated:Patient Re-evaluated prior to induction Oxygen Delivery Method: Circle system utilized Preoxygenation: Pre-oxygenation with 100% oxygen Induction Type: IV induction LMA: LMA inserted LMA Size: 4.0 Number of attempts: 1 Placement Confirmation: positive ETCO2 and breath sounds checked- equal and bilateral Tube secured with: Tape Dental Injury: Teeth and Oropharynx as per pre-operative assessment

## 2018-01-05 NOTE — Progress Notes (Signed)
RN called MD about pt increased drainage in wound vac MD stated that he is not worried about it we will continue to monitor. Secretary called OR and they only have 1 wound vac left they will call [place] to try and get more. AC notified

## 2018-01-05 NOTE — Anesthesia Postprocedure Evaluation (Signed)
Anesthesia Post Note  Patient: Aaron Mcconnell  Procedure(s) Performed: IRRIGATION AND DEBRIDEMENT EXTREMITY (Left )     Patient location during evaluation: PACU Anesthesia Type: General Level of consciousness: awake and alert Pain management: pain level controlled Vital Signs Assessment: post-procedure vital signs reviewed and stable Respiratory status: spontaneous breathing, nonlabored ventilation and respiratory function stable Cardiovascular status: blood pressure returned to baseline and stable Postop Assessment: no apparent nausea or vomiting Anesthetic complications: no    Last Vitals:  Vitals:   01/05/18 1331 01/05/18 1345  BP: (!) 105/59 105/64  Pulse: 73 82  Resp: (!) 21 18  Temp: (!) 36.3 C (!) 36.3 C  SpO2: 100% 98%    Last Pain:  Vitals:   01/05/18 1345  TempSrc: Oral  PainSc:                  Cecile HearingStephen Edward Turk

## 2018-01-05 NOTE — Progress Notes (Signed)
Patient arrived to PACU on Neo to maintain blood pressures. Will continue to monitor and wean off Neo drip as able.

## 2018-01-05 NOTE — Progress Notes (Signed)
Pharmacy Antibiotic Note  Aaron Mcconnell is a 42 y.o. male admitted on 01/05/2018 with sepsis.  Pharmacy has been consulted for Vancomycin/Zosyn dosing. [facility] Vancomycin 1000 mg IV x 1, Zosyn 3.375g IV x 1, and Clindamycin 900 mg IV x 1. Selected labs below.   Plan: Vancomycin 1000 mg IV q8h starting now Zosyn 3.375G IV q8h to be infused over 4 hours starting now Trend WBC, temp, renal function  F/U infectious work-up Drug levels as indicated  [facility] Scr 0.91 WBC 18.4  Pt wt: ~99 kg (per pt)  Aaron Mcconnell, Aaron Mcconnell 01/05/2018 6:06 AM

## 2018-01-05 NOTE — Transfer of Care (Signed)
Immediate Anesthesia Transfer of Care Note  Patient: Aaron Mcconnell  Procedure(s) Performed: IRRIGATION AND DEBRIDEMENT EXTREMITY (Left )  Patient Location: PACU  Anesthesia Type:General  Level of Consciousness: awake, alert  and oriented  Airway & Oxygen Therapy: Patient Spontanous Breathing  Post-op Assessment: Report given to RN and Post -op Vital signs reviewed and stable  Post vital signs: Reviewed and stable  Last Vitals:  Vitals:   01/05/18 1152 01/05/18 1200  BP: 106/72 97/68  Pulse: 83 73  Resp: 20 18  Temp: 36.6 C   SpO2: 98% 99%    Last Pain:  Vitals:   01/05/18 1200  TempSrc:   PainSc: 8       Patients Stated Pain Goal: 0 (01/05/18 0751)  Complications: No apparent anesthesia complications

## 2018-01-05 NOTE — Progress Notes (Signed)
Pt stated that Oxycodone makes you itch does not want to take it. Will ask doctor for something else for pain.

## 2018-01-06 ENCOUNTER — Encounter (HOSPITAL_COMMUNITY): Payer: Self-pay | Admitting: Student

## 2018-01-06 LAB — BASIC METABOLIC PANEL
Anion gap: 8 (ref 5–15)
BUN: 13 mg/dL (ref 6–20)
CHLORIDE: 100 mmol/L — AB (ref 101–111)
CO2: 27 mmol/L (ref 22–32)
CREATININE: 0.77 mg/dL (ref 0.61–1.24)
Calcium: 7.4 mg/dL — ABNORMAL LOW (ref 8.9–10.3)
GFR calc non Af Amer: >60 mL/min (ref >60)
Glucose, Bld: 84 mg/dL (ref 65–99)
Potassium: 3.6 mmol/L (ref 3.5–5.1)
Sodium: 135 mmol/L (ref 135–145)

## 2018-01-06 LAB — RETICULOCYTES
RBC.: 2.66 MIL/uL — AB (ref 4.22–5.81)
RETIC CT PCT: 0.8 % (ref 0.4–3.1)
Retic Count, Absolute: 21.3 10*3/uL (ref 19.0–186.0)

## 2018-01-06 LAB — RAPID URINE DRUG SCREEN, HOSP PERFORMED
Amphetamines: NOT DETECTED
BARBITURATES: NOT DETECTED
Benzodiazepines: POSITIVE — AB
COCAINE: NOT DETECTED
OPIATES: POSITIVE — AB
TETRAHYDROCANNABINOL: NOT DETECTED

## 2018-01-06 LAB — VITAMIN B12

## 2018-01-06 LAB — GLUCOSE, CAPILLARY
GLUCOSE-CAPILLARY: 93 mg/dL (ref 65–99)
GLUCOSE-CAPILLARY: 75 mg/dL (ref 65–99)
Glucose-Capillary: 92 mg/dL (ref 65–99)

## 2018-01-06 LAB — LIPASE, BLOOD: Lipase: 30 U/L (ref 11–51)

## 2018-01-06 LAB — HIV ANTIBODY (ROUTINE TESTING W REFLEX): HIV Screen 4th Generation wRfx: NONREACTIVE

## 2018-01-06 LAB — CBC
HEMATOCRIT: 25.2 % — AB (ref 39.0–52.0)
Hemoglobin: 8 g/dL — ABNORMAL LOW (ref 13.0–17.0)
MCH: 31.9 pg (ref 26.0–34.0)
MCHC: 31.7 g/dL (ref 30.0–36.0)
MCV: 100.4 fL — AB (ref 78.0–100.0)
PLATELETS: 148 10*3/uL — AB (ref 150–400)
RBC: 2.51 MIL/uL — AB (ref 4.22–5.81)
RDW: 13.7 % (ref 11.5–15.5)
WBC: 8.3 10*3/uL (ref 4.0–10.5)

## 2018-01-06 LAB — HEPATIC FUNCTION PANEL
ALK PHOS: 42 U/L (ref 38–126)
ALT: 36 U/L (ref 17–63)
AST: 78 U/L — ABNORMAL HIGH (ref 15–41)
Albumin: 1.9 g/dL — ABNORMAL LOW (ref 3.5–5.0)
BILIRUBIN TOTAL: 1.1 mg/dL (ref 0.3–1.2)
Bilirubin, Direct: 0.6 mg/dL — ABNORMAL HIGH (ref 0.1–0.5)
Indirect Bilirubin: 0.5 mg/dL (ref 0.3–0.9)
Total Protein: 6 g/dL — ABNORMAL LOW (ref 6.5–8.1)

## 2018-01-06 LAB — FOLATE: Folate: 5.8 ng/mL — ABNORMAL LOW (ref >5.9)

## 2018-01-06 LAB — AMYLASE: Amylase: 27 U/L — ABNORMAL LOW (ref 28–100)

## 2018-01-06 LAB — IRON AND TIBC: Iron: 18 ug/dL — ABNORMAL LOW (ref 45–182)

## 2018-01-06 LAB — FERRITIN: FERRITIN: 999 ng/mL — AB (ref 24–336)

## 2018-01-06 LAB — GAMMA GT: GGT: 66 U/L — ABNORMAL HIGH (ref 7–50)

## 2018-01-06 MED ORDER — SODIUM CHLORIDE 0.9 % IV SOLN
INTRAVENOUS | Status: AC
  Administered 2018-01-06: 11:00:00 via INTRAVENOUS

## 2018-01-06 MED ORDER — GABAPENTIN 300 MG PO CAPS
300.0000 mg | ORAL_CAPSULE | Freq: Three times a day (TID) | ORAL | Status: DC
  Administered 2018-01-06 – 2018-01-08 (×4): 300 mg via ORAL
  Filled 2018-01-06 (×4): qty 1

## 2018-01-06 NOTE — Progress Notes (Signed)
Physical Therapy Note  Patient suffers from Necrotizing fasciitis with extensive debridement and wound VAC placement which impairs their ability to perform daily activities like walking, transfers, bathing, and overall functional mobility  in the home.  A walker alone will not resolve the issues with performing activities of daily living. A wheelchair will allow patient to safely perform daily activities.  The patient can self propel in the home or has a caregiver who can provide assistance.     Aaron Mcconnell, [place]  Acute Rehabilitation Services Pager [phone] Office [phone]

## 2018-01-06 NOTE — Evaluation (Signed)
Physical Therapy Evaluation Patient Details Name: Aaron Mcconnell MRN: [id number] DOB: 02/05/1976 Today's Date: 01/06/2018   History of Present Illness  Admitted with LLE extensive infection, necrotizing fasciitis; Now s/p decompression with extensive debridement and VAC placement;  has no past medical history on file.  Clinical Impression   Patient is s/p above surgery resulting in functional limitations due to the deficits listed below (see PT Problem List). Presents with generalized weakness, painful LLE with weight bearing restrictions, which effect functional mobility; Noting upper body weakness and difficulty supporting body wiehgt on RW for R stepping; He hopes to be able to sue crutches, and I am in favor of trying them -- I'm not sure how successful he will be without putting too much weight through axillae; We briefly discussed the possibility of needing wheelchair;  Patient will benefit from skilled PT to increase their independence and safety with mobility to allow discharge to the venue listed below.       Follow Up Recommendations Home health PT;Supervision - Intermittent    Equipment Recommendations  Rolling walker with 5" wheels;3in1 (PT);Wheelchair (measurements PT);Other (comment)(hospital bed)    Recommendations for Other Services OT consult(will order per protocol)     Precautions / Restrictions Precautions Precautions: Fall Restrictions Weight Bearing Restrictions: Yes LLE Weight Bearing: Non weight bearing      Mobility  Bed Mobility Overal bed mobility: Needs Assistance Bed Mobility: Supine to Sit;Sit to Supine     Supine to sit: Min guard Sit to supine: Min guard   General bed mobility comments: Minguard mostly for lines  Transfers Overall transfer level: Needs assistance Equipment used: Rolling walker (2 wheeled) Transfers: Sit to/from Stand Sit to Stand: Min assist         General transfer comment: Took extra time to problem-solve through  hand placement; MUCH better when he pushes up from the bed with both UEs  Ambulation/Gait             General Gait Details: Pre-gait with RW: noting decr upper body strength, and unable to support self completely on RW to pick up RLE to simulate stepping; when he attempts this, his L foot touches down; not sure that he has the upper body strength for crutches  Stairs            Wheelchair Mobility    Modified Rankin (Stroke Patients Only)       Balance                                             Pertinent Vitals/Pain Pain Assessment: Faces Faces Pain Scale: Hurts a little bit Pain Location: LLE/calf Pain Descriptors / Indicators: Discomfort;Grimacing;Guarding Pain Intervention(s): Monitored during session    Home Living Family/patient expects to be discharged to:: Private residence Living Arrangements: Alone(with his dog) Available Help at Discharge: Family;Friend(s);Available PRN/intermittently(Reports lots of assist will be available) Type of Home: House Home Access: Level entry(to basement level)     Home Layout: Multi-level Home Equipment: Grab bars - tub/shower;None Additional Comments: Will likely stay in his basement den where there is also a bathroom and a small fridge    Prior Function Level of Independence: Independent               Hand Dominance        Extremity/Trunk Assessment   Upper Extremity Assessment Upper Extremity Assessment: Generalized weakness;Defer to  OT evaluation    Lower Extremity Assessment Lower Extremity Assessment: RLE deficits/detail;LLE deficits/detail RLE Deficits / Details: VAC dressing in place, decr ankle dorsiflexion/platnarflexion; painful with movement LLE Deficits / Details: Noted some toe deformities; Kerlix dressing around foot and ankle       Communication   Communication: No difficulties  Cognition Arousal/Alertness: Awake/alert Behavior During Therapy: WFL for tasks  assessed/performed Overall Cognitive Status: Within Functional Limits for tasks assessed                                        General Comments      Exercises     Assessment/Plan    PT Assessment Patient needs continued PT services  PT Problem List Decreased strength;Decreased range of motion;Decreased activity tolerance;Decreased balance;Decreased mobility;Decreased knowledge of use of DME;Decreased knowledge of precautions;Pain       PT Treatment Interventions DME instruction;Gait training;Stair training;Functional mobility training;Therapeutic activities;Therapeutic exercise;Patient/family education    PT Goals (Current goals can be found in the Care Plan section)  Acute Rehab PT Goals Patient Stated Goal: hopes to be able to use crutches; wants to get back to work PT Goal Formulation: With patient Time For Goal Achievement: 01/20/18 Potential to Achieve Goals: Good    Frequency Min 5X/week   Barriers to discharge Decreased caregiver support Considering getting a hospital bed for basement; will have lots of daytime help, alone at night; flight of steps to access full kitchen    Co-evaluation               AM-PAC PT "6 Clicks" Daily Activity  Outcome Measure Difficulty turning over in bed (including adjusting bedclothes, sheets and blankets)?: None Difficulty moving from lying on back to sitting on the side of the bed? : None Difficulty sitting down on and standing up from a chair with arms (e.g., wheelchair, bedside commode, etc,.)?: A Little Help needed moving to and from a bed to chair (including a wheelchair)?: A Lot Help needed walking in hospital room?: A Lot Help needed climbing 3-5 steps with a railing? : Total 6 Click Score: 16    End of Session   Activity Tolerance: Patient tolerated treatment well Patient left: in bed;with call bell/phone within reach Nurse Communication: Mobility status PT Visit Diagnosis: Unsteadiness on feet  (R26.81);Other abnormalities of gait and mobility (R26.89);Pain;Muscle weakness (generalized) (M62.81) Pain - Right/Left: Left Pain - part of body: Leg    Time: 1425-1448 PT Time Calculation (min) (ACUTE ONLY): 23 min   Charges:   PT Evaluation $PT Eval Moderate Complexity: 1 Mod PT Treatments $Therapeutic Activity: 8-22 mins   PT G Codes:        Van Clines, PT  Acute Rehabilitation Services Pager [phone] Office [phone]   Aaron Mcconnell 01/06/2018, 5:14 PM

## 2018-01-06 NOTE — Progress Notes (Signed)
Hospitalist progress note   Aaron Mcconnell  NWG:[id number] DOB: October 07, 1976 DOA: 01/05/2018 PCP: Patient, No Pcp Per   Specialists:    Brief Narrative:  53 male Habitual eTOH, neuropathy Admit from [facility] with nec fascitis and is now s/p surgery   Assessment & Plan:   Assessment:  The encounter diagnosis was Sepsis (HCC).  Nec fasc-might need further work-up and surgery-defer to Dr. Jena Gauss  Able-likely blood loss anemia-Would recheck in am-iron studies ordered by ortho-suspect there is a dilutional compoenet as well [given fluids peri-op as hypotensive, is +2.5 liters fluid]  Neuropathy-not a diabetic-probably related to ETOH- B12 levels are wnl-will get methylamalonic acid as could still have b12 def--he has macrocytosis and some mild TCP [likely 2/2 sepsis]  We will continue 1000 mcg for 5 days to see how his neuropathy does.  Will continue gabepentinc started 100 tid and titrate to 300 tid to see if helps  Dysphagia-no c/o today-will monitor    DVT prophylaxis: lovenox  Code Status:   full   Family Communication:    None  Disposition Plan: ip   Consultants:   ortho  Procedures:   Surgery 3/16  Antimicrobials:   vanc and zosyn started 3/15   Subjective:  Awake alert in nad Eating some Pain controlled-no real difference to numbness with gabapentin Seems to be going to surgery again tomorrow  Objective: Vitals:   01/05/18 1345 01/05/18 2002 01/05/18 2334 01/06/18 0522  BP: 105/64 (!) 97/50  98/60  Pulse: 82 99 99 76  Resp: 18 16 16 16   Temp: (!) 97.4 F (36.3 C) 99.1 F (37.3 C) 98.5 F (36.9 C) 98.9 F (37.2 C)  TempSrc: Oral Oral Oral Oral  SpO2: 98% 95% 99% 92%    Intake/Output Summary (Last 24 hours) at 01/06/2018 1001 Last data filed at 01/06/2018 0900 Gross per 24 hour  Intake 3830 ml  Output 1400 ml  Net 2430 ml   There were no vitals filed for this visit.  Examination:  eomi mild pallor no ict No lan no thryoid swelling cta b s1 s 2no  m Wound not examined Neuro deferred  Data Reviewed: I have personally reviewed following labs and imaging studies  CBC: Recent Labs  Lab 01/05/18 0628 01/06/18 0518  WBC 13.1* 8.3  NEUTROABS 11.5*  --   HGB 11.1* 8.0*  HCT 35.0* 25.2*  MCV 101.4* 100.4*  PLT 149* 148*   Basic Metabolic Panel: Recent Labs  Lab 01/05/18 0628 01/06/18 0518  NA 134* 135  K 4.0 3.6  CL 98* 100*  CO2 26 27  GLUCOSE 98 84  BUN 17 13  CREATININE 0.89 0.77  CALCIUM 7.7* 7.4*   GFR: CrCl cannot be calculated (Unknown ideal weight.). Liver Function Tests: Recent Labs  Lab 01/05/18 0628  AST 51*  ALT 35  ALKPHOS 64  BILITOT 1.5*  PROT 6.6  ALBUMIN 2.0*   No results for input(s): LIPASE, AMYLASE in the last 168 hours. No results for input(s): AMMONIA in the last 168 hours. Coagulation Profile: No results for input(s): INR, PROTIME in the last 168 hours. Cardiac Enzymes: No results for input(s): CKTOTAL, CKMB, CKMBINDEX, TROPONINI in the last 168 hours. CBG: Recent Labs  Lab 01/05/18 1654 01/05/18 2044  GLUCAP 102* 89   Urine analysis: No results found for: COLORURINE, APPEARANCEUR, LABSPEC, PHURINE, GLUCOSEU, HGBUR, BILIRUBINUR, KETONESUR, PROTEINUR, UROBILINOGEN, NITRITE, LEUKOCYTESUR   Radiology Studies: Reviewed images personally in health database    Scheduled Meds: . cyanocobalamin  1,000 mcg Intramuscular  Daily  . gabapentin  100 mg Oral TID   Continuous Infusions: . piperacillin-tazobactam (ZOSYN)  IV 3.375 g (01/06/18 0538)  . vancomycin Stopped (01/06/18 0733)     LOS: 1 day    Time spent: 3925    Pleas KochJai Othar Curto, MD [facility](P) [phone]   If 7PM-7AM, please contact night-coverage [email or web address] Password [email or web address] 01/06/2018, 10:01 AM

## 2018-01-06 NOTE — Progress Notes (Signed)
Orthopaedic Trauma Service (OTS)  1 Day Post-Op Procedure(s) (LRB): IRRIGATION AND DEBRIDEMENT EXTREMITY (Left)  Subjective: Patient reports pain as mild and that wounds have only been present for relatively short period of time. Mother appears amazed at level of denial, reports left great toe wound present for a year with intermittent bleeding. Uncle privately reports suspicion that patient is a regular and heavy drinker of liquor. Patient does not make his own.  Objective: Current Vitals Blood pressure 98/60, pulse 76, temperature 98.9 F (37.2 C), temperature source Oral, resp. rate 16, SpO2 92 %. Vital signs in last 24 hours: Temp:  [97.4 F (36.3 C)-99.1 F (37.3 C)] 98.9 F (37.2 C) (03/17 0522) Pulse Rate:  [73-99] 76 (03/17 0522) Resp:  [14-25] 16 (03/17 0522) BP: (86-123)/(50-102) 98/60 (03/17 0522) SpO2:  [92 %-100 %] 92 % (03/17 0522)  Intake/Output from previous day: 03/16 0701 - 03/17 0700 In: 3580 [I.V.:1930; IV Piggyback:750] Out: 1150 [Drains:1100; Blood:50]  LABS Recent Labs    01/05/18 0628 01/06/18 0518  HGB 11.1* 8.0*   Recent Labs    01/05/18 0628 01/06/18 0518 01/06/18 1013  WBC 13.1* 8.3  --   RBC 3.45* 2.51* 2.66*  HCT 35.0* 25.2*  --   PLT 149* 148*  --    Recent Labs    01/05/18 0628 01/06/18 0518  NA 134* 135  K 4.0 3.6  CL 98* 100*  CO2 26 27  BUN 17 13  CREATININE 0.89 0.77  GLUCOSE 98 84  CALCIUM 7.7* 7.4*   No results for input(s): LABPT, INR in the last 72 hours.   Physical Exam R&LUE   Sens  Ax/R/M/U intact  Mot   Ax/ R/ PIN/ M/ AIN/ U intact  Brisk CR LE's Baseline claw toe deformities bilaterally RLE 3 x 3cm wound directly under 3rd MTT head post debridement with no remaining eschar, no    visible bone, no drainage  2+ DP pulse  New drsg applied  LLE Hematoma accumulation under vac dressing but still working to pull off fluid  No tenderness or erythema extending proximally from zone of surgery  Eschar over  great toe medial plantar ulcer; hammertoe ulceration tip of 2nd toe  Edema/ swelling moderate  2+ DP pulse  Brisk cap refill, warm to touch  Assessment/Plan: 1 Day Post-Op Procedure(s) (LRB): IRRIGATION AND DEBRIDEMENT EXTREMITY (Left) for Nec Fasc 1. Continue abx; return to OR tomorrow with Dr. Jena GaussHaddix 2. DVT proph Other (comment) ECASA   Myrene GalasMichael Essynce Munsch, MD [facility] [phone] (p)

## 2018-01-07 ENCOUNTER — Encounter (HOSPITAL_COMMUNITY): Payer: Self-pay | Admitting: Certified Registered Nurse Anesthetist

## 2018-01-07 ENCOUNTER — Encounter (HOSPITAL_COMMUNITY)
Admission: AD | Disposition: A | Discharge status: Home Health | Payer: Self-pay | Source: Other Acute Inpatient Hospital | Attending: Orthopedic Surgery

## 2018-01-07 ENCOUNTER — Inpatient Hospital Stay (HOSPITAL_COMMUNITY): Payer: PRIVATE HEALTH INSURANCE | Admitting: Certified Registered Nurse Anesthetist

## 2018-01-07 HISTORY — PX: APPLICATION OF WOUND VAC: SHX5189

## 2018-01-07 HISTORY — PX: I & D EXTREMITY: SHX5045

## 2018-01-07 LAB — CBC WITH DIFFERENTIAL/PLATELET
BASOS PCT: 1 %
Basophils Absolute: 0 10*3/uL (ref 0.0–0.1)
Eosinophils Absolute: 0.1 10*3/uL (ref 0.0–0.7)
Eosinophils Relative: 1 %
HEMATOCRIT: 23.4 % — AB (ref 39.0–52.0)
Hemoglobin: 7.7 g/dL — ABNORMAL LOW (ref 13.0–17.0)
LYMPHS ABS: 1.1 10*3/uL (ref 0.7–4.0)
Lymphocytes Relative: 18 %
MCH: 33 pg (ref 26.0–34.0)
MCHC: 32.9 g/dL (ref 30.0–36.0)
MCV: 100.4 fL — ABNORMAL HIGH (ref 78.0–100.0)
MONOS PCT: 6 %
Monocytes Absolute: 0.4 10*3/uL (ref 0.1–1.0)
NEUTROS ABS: 4.6 10*3/uL (ref 1.7–7.7)
Neutrophils Relative %: 74 %
Platelets: 175 10*3/uL (ref 150–400)
RBC: 2.33 MIL/uL — ABNORMAL LOW (ref 4.22–5.81)
RDW: 14.1 % (ref 11.5–15.5)
WBC: 6.2 10*3/uL (ref 4.0–10.5)

## 2018-01-07 LAB — SURGICAL PCR SCREEN
MRSA, PCR: NEGATIVE
Staphylococcus aureus: POSITIVE — AB

## 2018-01-07 LAB — GLUCOSE, CAPILLARY
GLUCOSE-CAPILLARY: 81 mg/dL (ref 65–99)
GLUCOSE-CAPILLARY: 82 mg/dL (ref 65–99)
GLUCOSE-CAPILLARY: 187 mg/dL — AB (ref 65–99)
Glucose-Capillary: 76 mg/dL (ref 65–99)
Glucose-Capillary: 155 mg/dL — ABNORMAL HIGH (ref 65–99)

## 2018-01-07 LAB — VITAMIN B6

## 2018-01-07 LAB — VANCOMYCIN, TROUGH
Vancomycin Tr: 52 ug/mL (ref 15–20)
Vancomycin Tr: 17 ug/mL (ref 15–20)

## 2018-01-07 SURGERY — IRRIGATION AND DEBRIDEMENT EXTREMITY
Anesthesia: General | Site: Leg Lower | Laterality: Left

## 2018-01-07 MED ORDER — LACTATED RINGERS IV SOLN
INTRAVENOUS | Status: DC
  Administered 2018-01-07 – 2018-01-11 (×2): via INTRAVENOUS

## 2018-01-07 MED ORDER — FENTANYL CITRATE (PF) 250 MCG/5ML IJ SOLN
INTRAMUSCULAR | Status: AC
  Filled 2018-01-07: qty 5

## 2018-01-07 MED ORDER — ONDANSETRON HCL 4 MG/2ML IJ SOLN
INTRAMUSCULAR | Status: DC | PRN
  Administered 2018-01-07: 4 mg via INTRAVENOUS

## 2018-01-07 MED ORDER — EPHEDRINE SULFATE-NACL 50-0.9 MG/10ML-% IV SOSY
PREFILLED_SYRINGE | INTRAVENOUS | Status: DC | PRN
  Administered 2018-01-07: 5 mg via INTRAVENOUS

## 2018-01-07 MED ORDER — MUPIROCIN 2 % EX OINT
TOPICAL_OINTMENT | CUTANEOUS | Status: AC
  Administered 2018-01-07: 10:00:00
  Filled 2018-01-07: qty 22

## 2018-01-07 MED ORDER — LACTATED RINGERS IV SOLN
INTRAVENOUS | Status: DC | PRN
  Administered 2018-01-07: 12:00:00 via INTRAVENOUS

## 2018-01-07 MED ORDER — KETOROLAC TROMETHAMINE 30 MG/ML IJ SOLN: 30.0000 mg | Freq: Once | INTRAMUSCULAR | Status: DC | PRN

## 2018-01-07 MED ORDER — MEPERIDINE HCL 50 MG/ML IJ SOLN: 6.2500 mg | INTRAMUSCULAR | Status: DC | PRN

## 2018-01-07 MED ORDER — PROMETHAZINE HCL 25 MG/ML IJ SOLN: 6.2500 mg | INTRAMUSCULAR | Status: DC | PRN

## 2018-01-07 MED ORDER — ASPIRIN 325 MG PO TABS: 325.0000 mg | ORAL_TABLET | Freq: Every day | ORAL | Status: DC

## 2018-01-07 MED ORDER — LIDOCAINE 2% (20 MG/ML) 5 ML SYRINGE
INTRAMUSCULAR | Status: DC | PRN
  Administered 2018-01-07: 60 mg via INTRAVENOUS

## 2018-01-07 MED ORDER — PROPOFOL 10 MG/ML IV BOLUS
INTRAVENOUS | Status: DC | PRN
  Administered 2018-01-07: 200 mg via INTRAVENOUS

## 2018-01-07 MED ORDER — MIDAZOLAM HCL 2 MG/2ML IJ SOLN
INTRAMUSCULAR | Status: DC | PRN
  Administered 2018-01-07: 2 mg via INTRAVENOUS

## 2018-01-07 MED ORDER — LIDOCAINE HCL (CARDIAC) 20 MG/ML IV SOLN
INTRAVENOUS | Status: AC
  Filled 2018-01-07: qty 10

## 2018-01-07 MED ORDER — MIDAZOLAM HCL 2 MG/2ML IJ SOLN
INTRAMUSCULAR | Status: AC
  Filled 2018-01-07: qty 2

## 2018-01-07 MED ORDER — 0.9 % SODIUM CHLORIDE (POUR BTL) OPTIME
TOPICAL | Status: DC | PRN
  Administered 2018-01-07: 6000 mL

## 2018-01-07 MED ORDER — FENTANYL CITRATE (PF) 250 MCG/5ML IJ SOLN
INTRAMUSCULAR | Status: DC | PRN
  Administered 2018-01-07: 50 ug via INTRAVENOUS
  Administered 2018-01-07: 25 ug via INTRAVENOUS
  Administered 2018-01-07: 50 ug via INTRAVENOUS
  Administered 2018-01-07 (×5): 25 ug via INTRAVENOUS

## 2018-01-07 MED ORDER — ONDANSETRON HCL 4 MG/2ML IJ SOLN
INTRAMUSCULAR | Status: AC
  Filled 2018-01-07: qty 2

## 2018-01-07 MED ORDER — DEXAMETHASONE SODIUM PHOSPHATE 10 MG/ML IJ SOLN
INTRAMUSCULAR | Status: AC
  Filled 2018-01-07: qty 1

## 2018-01-07 MED ORDER — EPHEDRINE 5 MG/ML INJ
INTRAVENOUS | Status: AC
  Filled 2018-01-07: qty 10

## 2018-01-07 MED ORDER — DEXAMETHASONE SODIUM PHOSPHATE 10 MG/ML IJ SOLN
INTRAMUSCULAR | Status: DC | PRN
Start: 2018-01-07 — End: 2018-01-07
  Administered 2018-01-07: 10 mg via INTRAVENOUS

## 2018-01-07 MED ORDER — HYDROMORPHONE HCL 1 MG/ML IJ SOLN: 0.2500 mg | INTRAMUSCULAR | Status: DC | PRN

## 2018-01-07 MED ORDER — PROPOFOL 10 MG/ML IV BOLUS
INTRAVENOUS | Status: AC
  Filled 2018-01-07: qty 20

## 2018-01-07 SURGICAL SUPPLY — 52 items
BANDAGE ACE 4X5 VEL STRL LF (GAUZE/BANDAGES/DRESSINGS) IMPLANT
BNDG COHESIVE 4X5 TAN STRL (GAUZE/BANDAGES/DRESSINGS) ×3 IMPLANT
BNDG GAUZE ELAST 4 BULKY (GAUZE/BANDAGES/DRESSINGS) ×6 IMPLANT
BRUSH SCRUB SURG 4.25 DISP (MISCELLANEOUS) ×6 IMPLANT
CANISTER SUCT 3000ML PPV (MISCELLANEOUS) ×3 IMPLANT
CANISTER WOUND CARE 500ML ATS (WOUND CARE) ×3 IMPLANT
CHLORAPREP W/TINT 26ML (MISCELLANEOUS) ×3 IMPLANT
COVER MAYO STAND STRL (DRAPES) ×3 IMPLANT
COVER SURGICAL LIGHT HANDLE (MISCELLANEOUS) ×6 IMPLANT
DRAPE HALF SHEET 40X57 (DRAPES) IMPLANT
DRAPE INCISE IOBAN 66X45 STRL (DRAPES) IMPLANT
DRAPE ORTHO SPLIT 77X108 STRL (DRAPES) ×2
DRAPE SURG 17X23 STRL (DRAPES) ×3 IMPLANT
DRAPE SURG ORHT 6 SPLT 77X108 (DRAPES) ×1 IMPLANT
DRAPE U-SHAPE 47X51 STRL (DRAPES) ×3 IMPLANT
DRSG ADAPTIC 3X8 NADH LF (GAUZE/BANDAGES/DRESSINGS) ×3 IMPLANT
DRSG PAD ABDOMINAL 8X10 ST (GAUZE/BANDAGES/DRESSINGS) IMPLANT
DRSG VAC ATS LRG SENSATRAC (GAUZE/BANDAGES/DRESSINGS) IMPLANT
DRSG VAC ATS MED SENSATRAC (GAUZE/BANDAGES/DRESSINGS) IMPLANT
DRSG VAC ATS SM SENSATRAC (GAUZE/BANDAGES/DRESSINGS) IMPLANT
ELECT REM PT RETURN 9FT ADLT (ELECTROSURGICAL) ×3
ELECTRODE REM PT RTRN 9FT ADLT (ELECTROSURGICAL) ×1 IMPLANT
EVACUATOR 1/8 PVC DRAIN (DRAIN) IMPLANT
GAUZE SPONGE 4X4 12PLY STRL (GAUZE/BANDAGES/DRESSINGS) ×3 IMPLANT
GAUZE XEROFORM 5X9 LF (GAUZE/BANDAGES/DRESSINGS) ×3 IMPLANT
GLOVE BIO SURGEON STRL SZ7.5 (GLOVE) ×12 IMPLANT
GLOVE BIOGEL PI IND STRL 7.5 (GLOVE) ×1 IMPLANT
GLOVE BIOGEL PI INDICATOR 7.5 (GLOVE) ×2
GOWN STRL REUS W/ TWL LRG LVL3 (GOWN DISPOSABLE) ×2 IMPLANT
GOWN STRL REUS W/TWL LRG LVL3 (GOWN DISPOSABLE) ×4
HANDPIECE INTERPULSE COAX TIP (DISPOSABLE)
KIT BASIN OR (CUSTOM PROCEDURE TRAY) ×3 IMPLANT
KIT ROOM TURNOVER OR (KITS) ×3 IMPLANT
MANIFOLD NEPTUNE II (INSTRUMENTS) ×3 IMPLANT
NS IRRIG 1000ML POUR BTL (IV SOLUTION) ×3 IMPLANT
PACK ORTHO EXTREMITY (CUSTOM PROCEDURE TRAY) ×3 IMPLANT
PAD ARMBOARD 7.5X6 YLW CONV (MISCELLANEOUS) ×6 IMPLANT
PADDING CAST COTTON 6X4 STRL (CAST SUPPLIES) ×3 IMPLANT
SET HNDPC FAN SPRY TIP SCT (DISPOSABLE) IMPLANT
SPONGE LAP 18X18 X RAY DECT (DISPOSABLE) ×3 IMPLANT
SUT ETHILON 2 0 FS 18 (SUTURE) ×6 IMPLANT
SUT ETHILON 3 0 PS 1 (SUTURE) ×6 IMPLANT
SUT MON AB 2-0 CT1 36 (SUTURE) ×3 IMPLANT
SUT PDS AB 0 CT 36 (SUTURE) IMPLANT
SWAB CULTURE ESWAB REG 1ML (MISCELLANEOUS) IMPLANT
TOWEL OR 17X24 6PK STRL BLUE (TOWEL DISPOSABLE) ×3 IMPLANT
TOWEL OR 17X26 10 PK STRL BLUE (TOWEL DISPOSABLE) ×6 IMPLANT
TUBE CONNECTING 12'X1/4 (SUCTIONS) ×1
TUBE CONNECTING 12X1/4 (SUCTIONS) ×2 IMPLANT
UNDERPAD 30X30 (UNDERPADS AND DIAPERS) ×3 IMPLANT
WATER STERILE IRR 1000ML POUR (IV SOLUTION) ×3 IMPLANT
YANKAUER SUCT BULB TIP NO VENT (SUCTIONS) ×3 IMPLANT

## 2018-01-07 NOTE — Progress Notes (Signed)
Pharmacy Antibiotic Note  Aaron Mcconnell is a 42 y.o. male admitted on 01/05/2018 with sepsis from wound infection and possible osteo.  Pharmacy has been consulted for Vancomycin/Zosyn dosing. [facility]. WBC wnl. Afebrile. An 8.5 hr trough drawn this afternoon was therapeutic at 17. Patient is currently in the OR for I&D.   Plan: Continue vancomycin 1250 mg IV Q 8 hours Monitor CBC, renal fx, cultures and clinical progress   Weight: ~99 kg (per pt)  Temp (24hrs), Avg:98.5 F (36.9 C), Min:97.8 F (36.6 C), Max:99.8 F (37.7 C)  Recent Labs  Lab 01/05/18 0628  01/06/18 0518 01/07/18 0542 01/07/18 1338  WBC 13.1*  --  8.3 6.2  --   CREATININE 0.89  --  0.77  --   --   VANCOTROUGH  --    < >  --  52* 17   < > = values in this interval not displayed.    CrCl cannot be calculated (Unknown ideal weight.).    Allergies  Allergen Reactions  . Erythromycin Itching  . Oxycodone Itching    [name], PharmD., BCPS Clinical Pharmacist Clinical phone for 01/07/18 until 3:30pm: [phone] If after 3:30pm, please call main pharmacy at: [phone]

## 2018-01-07 NOTE — Anesthesia Postprocedure Evaluation (Signed)
Anesthesia Post Note  Patient: Aaron Mcconnell  Procedure(s) Performed: IRRIGATION AND DEBRIDEMENT EXTREMITY (Left Leg Lower) APPLICATION OF WOUND VAC (Left Leg Lower)     Patient location during evaluation: PACU Anesthesia Type: General Level of consciousness: awake and alert Pain management: pain level controlled Vital Signs Assessment: post-procedure vital signs reviewed and stable Respiratory status: spontaneous breathing, nonlabored ventilation, respiratory function stable and patient connected to nasal cannula oxygen Cardiovascular status: blood pressure returned to baseline and stable Postop Assessment: no apparent nausea or vomiting Anesthetic complications: no    Last Vitals:  Vitals:   01/07/18 1343 01/07/18 1355  BP: (!) 130/91   Pulse: (!) 56   Resp: 13   Temp:  36.6 C  SpO2: 99%                  Shelton SilvasKevin D Hollis

## 2018-01-07 NOTE — Interval H&P Note (Signed)
History and Physical Interval Note:  01/07/2018 11:49 AM  Aaron Mcconnell  has presented today for surgery, with the diagnosis of Left leg necrotizing fascitis  The various methods of treatment have been discussed with the patient and family. After consideration of risks, benefits and other options for treatment, the patient has consented to  Procedure(s): IRRIGATION AND DEBRIDEMENT EXTREMITY (Left) APPLICATION OF WOUND VAC (Left) as a surgical intervention .  The patient's history has been reviewed, patient examined, no change in status, stable for surgery.  I have reviewed the patient's chart and labs.  Questions were answered to the patient's satisfaction.     [name]

## 2018-01-07 NOTE — Transfer of Care (Signed)
Immediate Anesthesia Transfer of Care Note  Patient: Aaron Mcconnell  Procedure(s) Performed: IRRIGATION AND DEBRIDEMENT EXTREMITY (Left Leg Lower) APPLICATION OF WOUND VAC (Left Leg Lower)  Patient Location: PACU  Anesthesia Type:General  Level of Consciousness: awake, alert  and oriented  Airway & Oxygen Therapy: Patient Spontanous Breathing  Post-op Assessment: Report given to RN, Post -op Vital signs reviewed and stable and Patient moving all extremities X 4  Post vital signs: Reviewed and stable  Last Vitals:  Vitals:   01/06/18 2039 01/07/18 0356  BP: 120/67 120/81  Pulse: 78 74  Resp: 16 16  Temp: 37.7 C 36.7 C  SpO2: 94% 94%    Last Pain:  Vitals:   01/07/18 0509  TempSrc:   PainSc: 0-No pain      Patients Stated Pain Goal: 3 (01/06/18 1345)  Complications: No apparent anesthesia complications

## 2018-01-07 NOTE — Op Note (Signed)
OrthopaedicSurgeryOperativeNote (ONG:[id number](CSN:[id number]) Date of Surgery: 01/07/2018  Admit Date: 01/05/2018   Diagnoses: Pre-Op Diagnoses: Left left necrotizing fascitis Left heel full thickness ulcer   Post-Op Diagnosis: Same  Procedures: 1. CPT 11043-Irrigation and debridment lower leg wound <20 cm 2. CPT 11046-Additional muscle/fascia debridement 3. CPT 97606-Wound vac placement  Surgeons: Primary: Haddix, Gillie MannersKevin P, MD   Assistant: Montez MoritaKeith Paul, PA-C   Location:[facility] OR ROOM 03   AnesthesiaGeneral   Antibiotics:None given   Tourniquettime:None  EstimatedBloodLoss:30 mL   Complications:None  Specimens:None  Implants: None  IndicationsforSurgery: This is a 42 year old male who had necrotizing fasciitis that I performed a thorough irrigation and debridement with wound VAC placement 2 days prior.  Due to the contamination in the wound felt that return to the operating room would be most appropriate.  I discussed risks and benefits with the patient and he agreed to proceed with surgery and consent was obtained.   Operative Findings: Significant muscle necrosis most notably of the lateral compartment, deep posterior compartment, and soleus no obvious purulence in the wound.  Procedure: The patient was identified in the preoperative holding area. Consent was confirmed with the patient and their family and all questions were answered. The operative extremity was marked after confirmation with the patient. he was then brought back to the operating room by our anesthesia colleagues.  He was carefully transferred over to regular OR table.  He was placed under general anesthetic.  His wound VAC was taken down.  His lower extremity was then prepped and draped in usual sterile fashion.  We first explored the wound.  Some of the muscle had become nonviable.  It was gray and fibrinous.  This was debrided with a rondure and a Cobb elevator.  The majority of the lateral compartment  the soleus and the deep posterior compartment was debrided.  The anterior compartment looked relatively healthy.  After a thorough debridement was performed the whole wound was then irrigated with normal saline.  Of 6 L was irrigated through.  I then used a black granular foam sponge to cover the entirety of the wound.  A stapler was used to keep the sponge in place.  The wound VAC that was connected to 125 mmHg.  The patient was awoken from anesthesia and taken to the PACU in stable condition.  Post Op Plan/Instructions: Patient will continue nonweightbearing.  Continue wound VAC.  I have asked Dr. Lajoyce Cornersuda for evaluation and assistance with his wound he will plan to return to the operating room on Wednesday. Aspirin for DVT prophylaxis.  I was present and performed the entire surgery.  Truitt MerleKevin Haddix, MD [facility]

## 2018-01-07 NOTE — Progress Notes (Signed)
Hospitalist progress note   Harriet PhoJay Randall Soderquist  ZOX:[id number] DOB: 06/14/1976 DOA: 01/05/2018 PCP: Patient, No Pcp Per   Specialists:    Brief Narrative:  [age] male Habitual eTOH, neuropathy Admit from [facility] with nec fascitis and is now s/p surgery   Assessment & Plan:   Assessment:  The encounter diagnosis was Sepsis (HCC).  Nec fasc-going for and surgery-defer to Dr. 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  Will continue gabepentinc started 100 tid and titrate to 300 tid to see if helps Although work-up can be further done with labs--think this is all 2/2 ETOH--AST/ALT suggestive, GGT elevated and he is seriously thinking abou stopping drinking altogether  Dysphagia-no c/o today-will monitor    DVT prophylaxis: lovenox  Code Status:   full   Family Communication:    None  Disposition Plan: ip   Consultants:   ortho  Procedures:   Surgery 3/16  Antimicrobials:   vanc and zosyn started 3/15   Subjective:  Numbness improved-on d2 of high dose b12 methylmalonic pending Gabapentin helping 'I can really feel feet now" No  Cp about to go for further surgery  Objective: Vitals:   01/06/18 0522 01/06/18 1257 01/06/18 2039 01/07/18 0356  BP: 98/60 108/61 120/67 120/81  Pulse: 76 83 78 74  Resp: 16 18 16 16   Temp: 98.9 F (37.2 C) 98.8 F (37.1 C) 99.8 F (37.7 C) 98.1 F (36.7 C)  TempSrc: Oral Oral Oral Oral  SpO2: 92% 98% 94% 94%     Intake/Output Summary (Last 24 hours) at 01/07/2018 1013 Last data filed at 01/07/2018 0900 Gross per 24 hour  Intake 506.67 ml  Output 1875 ml  Net -1368.33 ml   There were no vitals filed for this visit.  Examination:  eomi mild pallor no ict No lan no thryoid swelling cta b s1 s2 no m No HSM no liver swelling Wound not examined Numbness improved in LE's  Data Reviewed: I have personally reviewed following labs and imaging studies  CBC: Recent Labs  Lab 01/05/18 0628 01/06/18 0518 01/07/18 0542  WBC 13.1* 8.3 6.2  NEUTROABS 11.5*  --  4.6  HGB 11.1* 8.0* 7.7*  HCT 35.0* 25.2* 23.4*  MCV 101.4* 100.4* 100.4*  PLT 149* 148* 175   Basic Metabolic Panel: Recent Labs  Lab 01/05/18 0628 01/06/18 0518  NA 134* 135  K 4.0 3.6  CL 98* 100*  CO2 26 27  GLUCOSE 98 84  BUN 17 13  CREATININE 0.89 0.77  CALCIUM 7.7* 7.4*   GFR: CrCl cannot be calculated (Unknown ideal weight.). Liver Function Tests: Recent Labs  Lab 01/05/18 0628 01/06/18 1013  AST 51* 78*  ALT 35 36  ALKPHOS 64 42  BILITOT 1.5* 1.1  PROT 6.6 6.0*  ALBUMIN 2.0* 1.9*   Recent Labs  Lab 01/06/18 1013  LIPASE 30  AMYLASE 27*   No results for input(s): AMMONIA in the last 168 hours. Coagulation Profile: No results for input(s): INR, PROTIME in the last 168 hours.  Cardiac Enzymes: No results for input(s): CKTOTAL, CKMB, CKMBINDEX, TROPONINI in the last 168 hours. CBG: Recent Labs  Lab 01/06/18 0620 01/06/18 1143 01/06/18 1634 01/06/18 2244 01/07/18 0400  GLUCAP 81 92 93 75 82   Urine analysis: No results found for: COLORURINE, APPEARANCEUR, LABSPEC, PHURINE, GLUCOSEU, HGBUR, BILIRUBINUR, KETONESUR, PROTEINUR, UROBILINOGEN, NITRITE, LEUKOCYTESUR   Radiology Studies: Reviewed images personally in health database    Scheduled Meds: . cyanocobalamin  1,000 mcg Intramuscular Daily  . gabapentin  300 mg Oral TID   Continuous Infusions: . sodium chloride 50 mL/hr at 01/06/18  1052  . lactated ringers    . piperacillin-tazobactam (ZOSYN)  IV 3.375 g (01/07/18 0509)  . vancomycin Stopped (01/07/18 1610)     LOS: 2 days    Time spent: 55    Pleas Koch, MD [facility]   If 7PM-7AM, please contact night-coverage [email or web address] Password [facility] 01/07/2018, 10:13 AM

## 2018-01-07 NOTE — Anesthesia Procedure Notes (Signed)
Procedure Name: LMA Insertion Date/Time: 01/07/2018 12:11 PM Performed by: Nils PyleBell, Camala Talwar T, CRNA Pre-anesthesia Checklist: Patient identified, Emergency Drugs available, Suction available and Patient being monitored Patient Re-evaluated:Patient Re-evaluated prior to induction Oxygen Delivery Method: Circle System Utilized Preoxygenation: Pre-oxygenation with 100% oxygen Induction Type: IV induction Ventilation: Mask ventilation without difficulty LMA: LMA inserted LMA Size: 4.0 Number of attempts: 1 Airway Equipment and Method: Bite block Placement Confirmation: positive ETCO2 Tube secured with: Tape Dental Injury: Teeth and Oropharynx as per pre-operative assessment

## 2018-01-07 NOTE — Progress Notes (Signed)
Pharmacy Antibiotic Note  Aaron Mcconnell is a 42 y.o. male admitted on 01/05/2018 with sepsis.  Pharmacy has been consulted for Vancomycin/Zosyn dosing. [facility] Van[facility]ycin 1000 mg IV x 1, Zosyn 3.375g IV x 1, and Clindamycin 900 mg IV x 1. Selected labs below.   3/18 AM: vancomycin trough of 52 was drawn while vancomycin was infusing, will re-time trough  Plan: Re-time vancomycin trough for 1330 today   Weight: ~99 kg (per pt)  Temp (24hrs), Avg:98.9 F (37.2 C), Min:98.1 F (36.7 C), Max:99.8 F (37.7 C)  Recent Labs  Lab 01/05/18 0628 01/05/18 2145 01/06/18 0518 01/07/18 0542  WBC 13.1*  --  8.3 6.2  CREATININE 0.89  --  0.77  --   VANCOTROUGH  --  13*  --  52*    CrCl cannot be calculated (Unknown ideal weight.).    Allergies  Allergen Reactions  . Erythromycin Itching  . Oxycodone Itching    Abran DukeLedford, Angus Amini 01/07/2018 6:54 AM

## 2018-01-07 NOTE — Anesthesia Preprocedure Evaluation (Signed)
Anesthesia Evaluation  Patient identified by MRN, date of birth, ID band Patient awake  General Assessment Comment:Allergies: Erythromycin  Reviewed: Allergy & Precautions, NPO status , Patient's Chart, lab work & pertinent test results  Airway Mallampati: II  TM Distance: >3 FB Neck ROM: Full    Dental  (+) Teeth Intact, Dental Advisory Given   Pulmonary neg pulmonary ROS,    Pulmonary exam normal breath sounds clear to auscultation       Cardiovascular Exercise Tolerance: Good negative cardio ROS Normal cardiovascular exam Rhythm:Regular Rate:Normal     Neuro/Psych negative neurological ROS  negative psych ROS   GI/Hepatic negative GI ROS, Neg liver ROS,   Endo/Other  negative endocrine ROS  Renal/GU negative Renal ROS  negative genitourinary   Musculoskeletal negative musculoskeletal ROS (+) Left leg infection    Abdominal Normal abdominal exam  (+)   Peds  Hematology  (+) Blood dyscrasia (Thrombocytopenia), anemia ,   Anesthesia Other Findings Day of surgery medications reviewed with the patient.  Reproductive/Obstetrics                             Anesthesia Physical  Anesthesia Plan  ASA: I  Anesthesia Plan: General   Post-op Pain Management:    Induction: Intravenous  PONV Risk Score and Plan: 2 and Dexamethasone and Ondansetron  Airway Management Planned: LMA  Additional Equipment:   Intra-op Plan:   Post-operative Plan: Extubation in OR  Informed Consent: I have reviewed the patients History and Physical, chart, labs and discussed the procedure including the risks, benefits and alternatives for the proposed anesthesia with the patient or authorized representative who has indicated his/her understanding and acceptance.   Dental advisory given  Plan Discussed with: CRNA and Surgeon  Anesthesia Plan Comments:         Anesthesia Quick Evaluation

## 2018-01-07 NOTE — Progress Notes (Signed)
CRITICAL VALUE ALERT  Critical Value:  Vancomycin trough 52  Date & Time Notied:  01/07/2018 @ [id number]  Provider Notified: Bruna PotterBlount, NP with [facility]  Orders Received/Actions taken: Pending

## 2018-01-07 NOTE — Consult Note (Signed)
WOC Nurse wound consult note Reason for Consult: Patient has just arrived from surgical debridement and placement of VAC therapy  For necrotizing fasciitis.  WIll defer to surgery until after first post operative dressing  Will not follow at this time.  Please re-consult if needed.  Maple HudsonKaren Karishma Unrein RN BSN [facility] Pager [phone]

## 2018-01-08 ENCOUNTER — Other Ambulatory Visit (INDEPENDENT_AMBULATORY_CARE_PROVIDER_SITE_OTHER): Payer: Self-pay | Admitting: Orthopedic Surgery

## 2018-01-08 ENCOUNTER — Encounter (HOSPITAL_COMMUNITY): Payer: Self-pay | Admitting: Student

## 2018-01-08 DIAGNOSIS — M726 Necrotizing fasciitis: Secondary | ICD-10-CM

## 2018-01-08 LAB — HEPATITIS PANEL, ACUTE
HEP A IGM: NEGATIVE
HEP B S AG: NEGATIVE
Hep B C IgM: NEGATIVE

## 2018-01-08 LAB — CBC
HEMATOCRIT: 25.1 % — AB (ref 39.0–52.0)
HEMATOCRIT: 25.8 % — AB (ref 39.0–52.0)
HEMOGLOBIN: 8.2 g/dL — AB (ref 13.0–17.0)
HEMOGLOBIN: 8.1 g/dL — AB (ref 13.0–17.0)
MCH: 32.9 pg (ref 26.0–34.0)
MCH: 31.3 pg (ref 26.0–34.0)
MCHC: 32.7 g/dL (ref 30.0–36.0)
MCHC: 31.4 g/dL (ref 30.0–36.0)
MCV: 100.8 fL — AB (ref 78.0–100.0)
MCV: 99.6 fL (ref 78.0–100.0)
Platelets: 240 10*3/uL (ref 150–400)
Platelets: 326 10*3/uL (ref 150–400)
RBC: 2.49 MIL/uL — AB (ref 4.22–5.81)
RBC: 2.59 MIL/uL — AB (ref 4.22–5.81)
RDW: 13.9 % (ref 11.5–15.5)
RDW: 13.5 % (ref 11.5–15.5)
WBC: 6.9 10*3/uL (ref 4.0–10.5)
WBC: 10 10*3/uL (ref 4.0–10.5)

## 2018-01-08 LAB — GLUCOSE, CAPILLARY
GLUCOSE-CAPILLARY: 131 mg/dL — AB (ref 65–99)
Glucose-Capillary: 158 mg/dL — ABNORMAL HIGH (ref 65–99)

## 2018-01-08 MED ORDER — ENOXAPARIN SODIUM 60 MG/0.6ML ~~LOC~~ SOLN
50.0000 mg | Freq: Every day | SUBCUTANEOUS | Status: DC
  Administered 2018-01-12 – 2018-01-25 (×10): 50 mg via SUBCUTANEOUS
  Filled 2018-01-08 (×13): qty 0.6

## 2018-01-08 MED ORDER — GABAPENTIN 400 MG PO CAPS
400.0000 mg | ORAL_CAPSULE | Freq: Three times a day (TID) | ORAL | Status: DC
  Administered 2018-01-08 – 2018-01-23 (×42): 400 mg via ORAL
  Filled 2018-01-08 (×44): qty 1

## 2018-01-08 NOTE — Consult Note (Signed)
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  Present on admission.  Patient has been treating at home with [employer] for some time.   Wound type: neuropathic ulcer.  Callous to periwound Pressure Injury POA: Yes pressure and decreased sensation to feet.  Left leg with necrotizing fasciitis.  Back to surgery tomorrow for additional debridement or transmetatarsal amputation.  Discussed need for studious management of right plantar foot wound.  Measurement: 3 cm x 2.2 cm x 0.3 cm with calloused periwound present circumferentially.   Wound bed: ruddy red Drainage (amount, consistency, odor) minimal serosanguinous Periwound: callous Dressing procedure/placement/frequency:Discussed need for podiatry and paring of periwound callous for healing.  Verbalized understanding and agrees to see podiatrist  He has a friend that is a podiatrist and will aid him, he states.  While inpatient, begin the following:  Cleanse right foot with NS and pat dry.  Apply Aquacel Ag to wound bed.  Cover with 4x4 gauze and kerlix/tape.  Change daily.  Will not follow at this time.  Please re-consult if needed.  Maple HudsonKaren Lilyanne Mcquown RN BSN [facility] Pager [phone]

## 2018-01-08 NOTE — Anesthesia Preprocedure Evaluation (Addendum)
Anesthesia Evaluation  Patient identified by MRN, date of birth, ID band Patient awake    Reviewed: Allergy & Precautions, H&P , NPO status , Patient's Chart, lab work & pertinent test results  Airway Mallampati: II  TM Distance: >3 FB Neck ROM: Full    Dental no notable dental hx. (+) Teeth Intact, Dental Advisory Given   Pulmonary neg pulmonary ROS,    Pulmonary exam normal breath sounds clear to auscultation       Cardiovascular Exercise Tolerance: Good negative cardio ROS   Rhythm:Regular Rate:Normal     Neuro/Psych negative neurological ROS  negative psych ROS   GI/Hepatic negative GI ROS, Neg liver ROS,   Endo/Other  negative endocrine ROS  Renal/GU negative Renal ROS  negative genitourinary   Musculoskeletal   Abdominal   Peds  Hematology negative hematology ROS (+)   Anesthesia Other Findings   Reproductive/Obstetrics negative OB ROS                            Anesthesia Physical Anesthesia Plan  ASA: I  Anesthesia Plan: General   Post-op Pain Management:    Induction: Intravenous  PONV Risk Score and Plan: 3 and Ondansetron, Dexamethasone and Midazolam  Airway Management Planned: LMA  Additional Equipment:   Intra-op Plan:   Post-operative Plan: Extubation in OR  Informed Consent: I have reviewed the patients History and Physical, chart, labs and discussed the procedure including the risks, benefits and alternatives for the proposed anesthesia with the patient or authorized representative who has indicated his/her understanding and acceptance.     Dental advisory given  Plan Discussed with: CRNA  Anesthesia Plan Comments:       Anesthesia Quick Evaluation  

## 2018-01-08 NOTE — Progress Notes (Signed)
Pharmacy Communication Note:  Pharmacy consulted to start Lovenox for VTE prophylaxis. Pt weighs 100.5kg per RN. Will start Lovenox 50 mg (0.5 mg/kg) SQ daily  Vinnie LevelBenjamin Caden Fatica, PharmD., BCPS Clinical Pharmacist Clinical phone for 01/08/18 until 3:30pm: [phone] If after 3:30pm, please call main pharmacy at: [phone]

## 2018-01-08 NOTE — Progress Notes (Signed)
[facility] Progress Note   Patient ID: Aaron Mcconnell MRN: [id number] DOB/AGE: 40/05/1976 40 y.o.  Subjective:  Doing ok  Has been evaluated by Dr. Lajoyce Cornersuda  Return to OR tomorrow    ROS As above Objective:   VITALS:   Vitals:   01/07/18 1355 01/07/18 2142 01/08/18 0021 01/08/18 0613  BP:  119/73 131/88 114/67  Pulse:  (!) 59 (!) 57 (!) 42  Resp:  18 18 17   Temp: 97.8 F (36.6 C) 98 F (36.7 C) 98.1 F (36.7 C) 98 F (36.7 C)  TempSrc:  Oral Oral Oral  SpO2:  95% 100% 100%    There is no height or weight on file to calculate BMI.   Intake/Output      03/18 0701 - 03/19 0700 03/19 0701 - 03/20 0700   P.O. 240    I.V. 754.3    IV Piggyback 950    Total Intake 1944.3    Urine 1025    Drains 450    Blood 30    Total Output 1505    Net +439.3         Urine Occurrence 1 x      LABS  Results for orders placed or performed during the hospital encounter of 01/05/18 (from the past 24 hour(s))  Glucose, capillary     Status: None   Collection Time: 01/07/18 10:20 AM  Result Value Ref Range   Glucose-Capillary 76 65 - 99 mg/dL  Vancomycin, trough     Status: None   Collection Time: 01/07/18  1:38 PM  Result Value Ref Range   Vancomycin Tr 17 15 - 20 ug/mL  Glucose, capillary     Status: Abnormal   Collection Time: 01/07/18  6:25 PM  Result Value Ref Range   Glucose-Capillary 155 (H) 65 - 99 mg/dL   Comment 1 Notify RN    Comment 2 Document in Chart   Glucose, capillary     Status: Abnormal   Collection Time: 01/07/18  9:40 PM  Result Value Ref Range   Glucose-Capillary 187 (H) 65 - 99 mg/dL  Glucose, capillary     Status: Abnormal   Collection Time: 01/08/18  6:12 AM  Result Value Ref Range   Glucose-Capillary 158 (H) 65 - 99 mg/dL  CBC     Status: Abnormal   Collection Time: 01/08/18  6:28 AM  Result Value Ref Range   WBC 6.9 4.0 - 10.5 K/uL   RBC 2.49 (L) 4.22 - 5.81 MIL/uL   Hemoglobin 8.2 (L) 13.0 - 17.0  g/dL   HCT 40.925.1 (L) 81.139.0 - 91.452.0 %   MCV 100.8 (H) 78.0 - 100.0 fL   MCH 32.9 26.0 - 34.0 pg   MCHC 32.7 30.0 - 36.0 g/dL   RDW 78.213.9 95.611.5 - 21.315.5 %   Platelets 240 150 - 400 K/uL     PHYSICAL EXAM:   Gen: NAD, mom at bedside  Ext:       Left Lower Extremity   VAC stable  Dressing intact   Assessment/Plan: 1 Day Post-Op   Principal Problem:   Sepsis (HCC) Active Problems:   Osteomyelitis of ankle or foot, acute, unspecified laterality (HCC)   Anti-infectives (From admission, onward)   Start     Dose/Rate Route Frequency Ordered Stop   01/06/18 0600  vancomycin (VANCOCIN) 1,250 mg in sodium chloride 0.9 % 250 mL IVPB     1,250 mg 166.7 mL/hr over 90 Minutes Intravenous Every 8 hours 01/05/18 2240  01/05/18 0615  vancomycin (VANCOCIN) IVPB 1000 mg/200 mL premix  Status:  Discontinued     1,000 mg 200 mL/hr over 60 Minutes Intravenous Every 8 hours 01/05/18 0613 01/05/18 2240   01/05/18 0615  piperacillin-tazobactam (ZOSYN) IVPB 3.375 g     3.375 g 12.5 mL/hr over 240 Minutes Intravenous Every 8 hours 01/05/18 0613      .  POD/HD#: 1  42y/o male with necrotizing fascitis L leg   -Nec Fasc L leg s/p serial debridements  Return to OR tomorrow with Dr. Lajoyce Corners  Limb salvage vs transtibial amp   Will change attending tomorrow   - neuropathic ulcer R foot  Continue per wound care recs   - Pain management:  Continue with current management   - ABL anemia/Hemodynamics  Will check CBC this afternoon  May need blood before OR tomorrow  - Medical issues   Neuropathy    Per medicine  - DVT/PE prophylaxis:    lovenox    Hold this pm   - ID:   On scheduled abx    - FEN/GI prophylaxis/Foley/Lines:  NPO after MN   IVF  - Dispo:  OR tomorrow with Dr. Margrett Rud, PA-C [facility] [phone] ([phone] Traci Sermon (C) 01/08/2018, 9:43 AM

## 2018-01-08 NOTE — Plan of Care (Signed)
  Progressing Education: Knowledge of General Education information will improve 01/08/2018 1635 - Progressing by Darreld Mcleanox, Cammi Consalvo, RN Health Behavior/Discharge Planning: Ability to manage health-related needs will improve 01/08/2018 1635 - Progressing by Darreld Mcleanox, Ceirra Belli, RN Clinical Measurements: Ability to maintain clinical measurements within normal limits will improve 01/08/2018 1635 - Progressing by Darreld Mcleanox, Marny Smethers, RN Will remain free from infection 01/08/2018 1635 - Progressing by Darreld Mcleanox, Candy Ziegler, RN Diagnostic test results will improve 01/08/2018 1635 - Progressing by Darreld Mcleanox, Huxley Shurley, RN Respiratory complications will improve 01/08/2018 1635 - Progressing by Darreld Mcleanox, Caroline Longie, RN Cardiovascular complication will be avoided 01/08/2018 1635 - Progressing by Darreld Mcleanox, Maalle Starrett, RN Activity: Risk for activity intolerance will decrease 01/08/2018 1635 - Progressing by Darreld Mcleanox, Layloni Fahrner, RN Nutrition: Adequate nutrition will be maintained 01/08/2018 1635 - Progressing by Darreld Mcleanox, Nemesio Castrillon, RN Coping: Level of anxiety will decrease 01/08/2018 1635 - Progressing by Darreld Mcleanox, Zakaree Mcclenahan, RN Elimination: Will not experience complications related to bowel motility 01/08/2018 1635 - Progressing by Darreld Mcleanox, Abigal Choung, RN Will not experience complications related to urinary retention 01/08/2018 1635 - Progressing by Darreld Mcleanox, Barnard Sharps, RN Pain Managment: General experience of comfort will improve 01/08/2018 1635 - Progressing by Darreld Mcleanox, Zannie Locastro, RN Safety: Ability to remain free from injury will improve 01/08/2018 1635 - Progressing by Darreld Mcleanox, Laketa Sandoz, RN Skin Integrity: Risk for impaired skin integrity will decrease 01/08/2018 1635 - Progressing by Darreld Mcleanox, Hendy Brindle, RN

## 2018-01-08 NOTE — Progress Notes (Addendum)
Seen and examined  He is aware of need for surgery and has been seen by Dr. Lajoyce Corners who has explained the POC His neuropathy seems improved on gabapentin--will increase and titrate dose to 400 mg tid as tolerating well Regarding his b12, will stop IM dose 1000 mcg 01/10/18--there after give oral b12 500--1000 mcg daily orally subsequently  O/e his numbness seems improved in LLE although slight difference to pin prick with it being felt more on R>L side  We will sign off at this time and defer further care to Ortho regarding further operative management--I would suggest OP neurology input once d/c to further ascertain cause for neuropathy if his methylmalonic acid comes back normal--would expect it to be low   Pleas Koch, MD [facility] [phone]

## 2018-01-08 NOTE — H&P (View-Only) (Signed)
  ORTHOPAEDIC CONSULTATION  REQUESTING PHYSICIAN: Haddix, Kevin P, MD  Chief Complaint: Necrotizing fasciitis left leg status post multiple debridements.  HPI: Aaron Mcconnell is a 42 y.o. male who presents with 2 debridements for the left lower extremity for necrotizing fasciitis.  Patient does have neuropathy he denies diabetes but states he has a strong family history of diabetes.  History reviewed. No pertinent past medical history. Past Surgical History:  Procedure Laterality Date  . APPLICATION OF WOUND VAC Left 01/07/2018   Procedure: APPLICATION OF WOUND VAC;  Surgeon: Haddix, Kevin P, MD;  Location: [facility] OR;  Service: Orthopedics;  Laterality: Left;  . I&D EXTREMITY Left 01/05/2018   Procedure: IRRIGATION AND DEBRIDEMENT EXTREMITY;  Surgeon: Haddix, Kevin P, MD;  Location: [facility] OR;  Service: Orthopedics;  Laterality: Left;  . I&D EXTREMITY Left 01/07/2018   Procedure: IRRIGATION AND DEBRIDEMENT EXTREMITY;  Surgeon: Haddix, Kevin P, MD;  Location: [facility] OR;  Service: Orthopedics;  Laterality: Left;   Social History   Socioeconomic History  . Marital status: Single    Spouse name: None  . Number of children: None  . Years of education: None  . Highest education level: None  Social Needs  . Financial resource strain: None  . Food insecurity - worry: None  . Food insecurity - inability: None  . Transportation needs - medical: None  . Transportation needs - non-medical: None  Occupational History  . None  Tobacco Use  . Smoking status: Never Smoker  . Smokeless tobacco: Never Used  Substance and Sexual Activity  . Alcohol use: Yes  . Drug use: No  . Sexual activity: None  Other Topics Concern  . None  Social History Narrative  . None   Family History  Problem Relation Age of Onset  . Diabetes Mellitus II Mother    - negative except otherwise stated in the family history section Allergies  Allergen Reactions  . Erythromycin Itching  . Oxycodone Itching   Prior  to Admission medications   Not on File   No results found. - pertinent xrays, CT, MRI studies were reviewed and independently interpreted  Positive ROS: All other systems have been reviewed and were otherwise negative with the exception of those mentioned in the HPI and as above.  Physical Exam: General: Alert, no acute distress Psychiatric: Patient is competent for consent with normal mood and affect Lymphatic: No axillary or cervical lymphadenopathy Cardiovascular: No pedal edema Respiratory: No cyanosis, no use of accessory musculature GI: No organomegaly, abdomen is soft and non-tender  Skin: Left lower extremity, extensile excision involving the left lower extremity from the calcaneus up to the tibial tubercle.  Examination of the right foot he has clawing of the toes with ulceration beneath the metatarsal heads consistent with diabetic insensate neuropathy.  Images:  @ENCIMAGES@   Neurologic: Patient does not have protective sensation bilateral lower extremities.   MUSCULOSKELETAL:  Examination patient has good hair growth on both lower extremities he has a strong dorsalis pedis and posterior tibial pulse bilaterally.  There is no ascending cellulitis.  The wound VAC is functioning well.  Despite the compartment loss from the necrotizing fasciitis patient does have active plantar flexion and dorsiflexion of the ankle and has active flexion and extension of his left knee.  Hemoglobin 8.2, albumin 1.9.  Assessment: Assessment: Resolving necrotizing fasciitis left lower extremity with large soft tissue defect involving the calcaneus and left leg.  Severe protein caloric malnutrition.  Plan: Plan: Will plan on surgery Wednesday   morning for evaluation of limb salvage versus transtibial amputation.  Discussed with the patient we do not have sufficient soft tissue for coverage of the calcaneus we would proceed with a transtibial amputation otherwise with proceed with installation  wound VAC for continued improvement in granulation tissue with ultimate placement of split-thickness skin graft.  Thank you for the consult and the opportunity to see Aaron Mcconnell  Aaron Ballinas, MD [facility] [phone] 7:19 AM     

## 2018-01-08 NOTE — Consult Note (Signed)
ORTHOPAEDIC CONSULTATION  REQUESTING PHYSICIAN: Haddix, Gillie Manners, MD  Chief Complaint: Necrotizing fasciitis left leg status post multiple debridements.  HPI: [name] is a 42 y.o. male who presents with 2 debridements for the left lower extremity for necrotizing fasciitis.  Patient does have neuropathy he denies diabetes but states he has a strong family history of diabetes.  History reviewed. No pertinent past medical history. Past Surgical History:  Procedure Laterality Date  . APPLICATION OF WOUND VAC Left 01/07/2018   Procedure: APPLICATION OF WOUND VAC;  Surgeon: Roby Lofts, MD;  Location: [facility];  Service: Orthopedics;  Laterality: Left;  . I&D EXTREMITY Left 01/05/2018   Procedure: IRRIGATION AND DEBRIDEMENT EXTREMITY;  Surgeon: Roby Lofts, MD;  Location: [facility] OR;  Service: Orthopedics;  Laterality: Left;  . I&D EXTREMITY Left 01/07/2018   Procedure: IRRIGATION AND DEBRIDEMENT EXTREMITY;  Surgeon: Roby Lofts, MD;  Location: [facility] OR;  Service: Orthopedics;  Laterality: Left;   Social History   Socioeconomic History  . Marital status: Single    Spouse name: None  . Number of children: None  . Years of education: None  . Highest education level: None  Social Needs  . Financial resource strain: None  . Food insecurity - worry: None  . Food insecurity - inability: None  . Transportation needs - medical: None  . Transportation needs - non-medical: None  Occupational History  . None  Tobacco Use  . Smoking status: Never Smoker  . Smokeless tobacco: Never Used  Substance and Sexual Activity  . Alcohol use: Yes  . Drug use: No  . Sexual activity: None  Other Topics Concern  . None  Social History Narrative  . None   Family History  Problem Relation Age of Onset  . Diabetes Mellitus II Mother    - negative except otherwise stated in the family history section Allergies  Allergen Reactions  . Erythromycin Itching  . Oxycodone Itching   Prior  to Admission medications   Not on File   No results found. - pertinent xrays, CT, MRI studies were reviewed and independently interpreted  Positive ROS: All other systems have been reviewed and were otherwise negative with the exception of those mentioned in the HPI and as above.  Physical Exam: General: Alert, no acute distress Psychiatric: Patient is competent for consent with normal mood and affect Lymphatic: No axillary or cervical lymphadenopathy Cardiovascular: No pedal edema Respiratory: No cyanosis, no use of accessory musculature GI: No organomegaly, abdomen is soft and non-tender  Skin: Left lower extremity, extensile excision involving the left lower extremity from the calcaneus up to the tibial tubercle.  Examination of the right foot he has clawing of the toes with ulceration beneath the metatarsal heads consistent with diabetic insensate neuropathy.  Images:  @ENCIMAGES @   Neurologic: Patient does not have protective sensation bilateral lower extremities.   MUSCULOSKELETAL:  Examination patient has good hair growth on both lower extremities he has a strong dorsalis pedis and posterior tibial pulse bilaterally.  There is no ascending cellulitis.  The wound VAC is functioning well.  Despite the compartment loss from the necrotizing fasciitis patient does have active plantar flexion and dorsiflexion of the ankle and has active flexion and extension of his left knee.  Hemoglobin 8.2, albumin 1.9.  Assessment: Assessment: Resolving necrotizing fasciitis left lower extremity with large soft tissue defect involving the calcaneus and left leg.  Severe protein caloric malnutrition.  Plan: Plan: Will plan on surgery Wednesday  morning for evaluation of limb salvage versus transtibial amputation.  Discussed with the patient we do not have sufficient soft tissue for coverage of the calcaneus we would proceed with a transtibial amputation otherwise with proceed with installation  wound VAC for continued improvement in granulation tissue with ultimate placement of split-thickness skin graft.  Thank you for the consult and the opportunity to see [name]  Tyann Niehaus, MD [facility] [phone] 7:19 AM

## 2018-01-08 NOTE — Progress Notes (Signed)
Physical Therapy Treatment Patient Details Name: Aaron Mcconnell MRN: [id number] DOB: 06/14/1976 Today's Date: 01/08/2018    History of Present Illness Admitted with LLE extensive infection, necrotizing fasciitis; Now s/p decompression with extensive debridement and VAC placement; to OR for Possible reconstruction versus BKA 3/19 ;  has no past medical history on file.    PT Comments    Continuing work on functional mobility and activity tolerance;  Aaron Mcconnell is very interested in using cruthces at dc; practiced with both crutches and RW, and he had difficulty keeping NWB LLE with both; at this point, I am recommending RW over crutches because it gives more stability than crutches; Even then, Aaron Mcconnell may dc at a wheelchair transfer level if he is unable to maintain NWB LLE  Follow Up Recommendations  Home health PT;Supervision/Assistance - 24 hour     Equipment Recommendations  Rolling walker with 5" wheels;3in1 (PT);Wheelchair (measurements PT);Other (comment)(hospital bed)    Recommendations for Other Services OT consult(will order per protocol)     Precautions / Restrictions Precautions Precautions: Fall Restrictions LLE Weight Bearing: Non weight bearing Other Position/Activity Restrictions: Difficulty maintianing NWB with RW and crutches    Mobility  Bed Mobility Overal bed mobility: Needs Assistance Bed Mobility: Supine to Sit;Sit to Supine     Supine to sit: Supervision Sit to supine: Supervision   General bed mobility comments: Supervision mostly for lines  Transfers Overall transfer level: Needs assistance Equipment used: Rolling walker (2 wheeled);Crutches Transfers: Sit to/from Stand Sit to Stand: Min assist         General transfer comment: Took extra time to problem-solve through hand placement; MUCH better when he pushes up from the bed with both UEs; Noted unsteady and heavy dependence on UEs to stabilize; Decr control of descent with stand to sit; Demo and  verbal cues for crutch management  Ambulation/Gait Ambulation/Gait assistance: Mod assist Ambulation Distance (Feet): 2 Feet(fwd and backwd; 2 x, with RW, then with crutches) Assistive device: Rolling walker (2 wheeled);Crutches Gait Pattern/deviations: (Hop-to)     General Gait Details: Unable to support full body weight on UEs with RW or cruthces; tends to touchdown LLE with attempts at stepping RLE; very unsteady, requiring moderate assist for balance (especially with crutches); with crutches, steps uncontrolled, and unctrolled descent to sitting on the bed   Stairs            Wheelchair Mobility    Modified Rankin (Stroke Patients Only)       Balance                                            Cognition Arousal/Alertness: Awake/alert Behavior During Therapy: WFL for tasks assessed/performed Overall Cognitive Status: Within Functional Limits for tasks assessed                                        Exercises      General Comments General comments (skin integrity, edema, etc.): Lengthy discussion re: upcoming surgery, and recommendations and possibilities for discharge      Pertinent Vitals/Pain Pain Assessment: Faces Faces Pain Scale: Hurts a little bit Pain Location: LLE/calf Pain Descriptors / Indicators: Discomfort;Grimacing;Guarding Pain Intervention(s): Monitored during session    Home Living  Prior Function            PT Goals (current goals can now be found in the care plan section) Acute Rehab PT Goals Patient Stated Goal: hopes to be able to use crutches; wants to get back to work PT Goal Formulation: With patient Time For Goal Achievement: 01/20/18 Potential to Achieve Goals: Fair Progress towards PT goals: Progressing toward goals    Frequency    Min 5X/week      PT Plan Current plan remains appropriate    Co-evaluation              AM-PAC PT "6 Clicks" Daily  Activity  Outcome Measure  Difficulty turning over in bed (including adjusting bedclothes, sheets and blankets)?: None Difficulty moving from lying on back to sitting on the side of the bed? : None Difficulty sitting down on and standing up from a chair with arms (e.g., wheelchair, bedside commode, etc,.)?: A Lot Help needed moving to and from a bed to chair (including a wheelchair)?: A Lot Help needed walking in hospital room?: A Lot Help needed climbing 3-5 steps with a railing? : Total 6 Click Score: 15    End of Session Equipment Utilized During Treatment: Gait belt Activity Tolerance: Patient tolerated treatment well Patient left: in bed;with call bell/phone within reach Nurse Communication: Mobility status PT Visit Diagnosis: Unsteadiness on feet (R26.81);Other abnormalities of gait and mobility (R26.89);Pain;Muscle weakness (generalized) (M62.81) Pain - Right/Left: Left Pain - part of body: Leg     Time: 1610-9604 PT Time Calculation (min) (ACUTE ONLY): 29 min  Charges:  $Gait Training: 8-22 mins $Therapeutic Activity: 8-22 mins                    G Codes:       Van Clines, PT  Acute Rehabilitation Services Pager [phone] Office [phone]    Aaron Mcconnell 01/08/2018, 4:32 PM

## 2018-01-09 ENCOUNTER — Encounter (HOSPITAL_COMMUNITY)
Admission: AD | Disposition: A | Discharge status: Home Health | Payer: Self-pay | Source: Other Acute Inpatient Hospital | Attending: Orthopedic Surgery

## 2018-01-09 ENCOUNTER — Other Ambulatory Visit (INDEPENDENT_AMBULATORY_CARE_PROVIDER_SITE_OTHER): Payer: Self-pay | Admitting: Orthopedic Surgery

## 2018-01-09 ENCOUNTER — Inpatient Hospital Stay (HOSPITAL_COMMUNITY): Payer: PRIVATE HEALTH INSURANCE | Admitting: Anesthesiology

## 2018-01-09 DIAGNOSIS — A419 Sepsis, unspecified organism: Secondary | ICD-10-CM

## 2018-01-09 DIAGNOSIS — M726 Necrotizing fasciitis: Secondary | ICD-10-CM

## 2018-01-09 DIAGNOSIS — M86179 Other acute osteomyelitis, unspecified ankle and foot: Secondary | ICD-10-CM

## 2018-01-09 HISTORY — PX: I & D EXTREMITY: SHX5045

## 2018-01-09 HISTORY — PX: APPLICATION OF WOUND VAC: SHX5189

## 2018-01-09 LAB — METHYLMALONIC ACID, SERUM: Methylmalonic Acid, Quantitative: 239 nmol/L (ref 0–378)

## 2018-01-09 LAB — GLUCOSE, CAPILLARY
GLUCOSE-CAPILLARY: 68 mg/dL (ref 65–99)
GLUCOSE-CAPILLARY: 64 mg/dL — AB (ref 65–99)
GLUCOSE-CAPILLARY: 97 mg/dL (ref 65–99)
Glucose-Capillary: 100 mg/dL — ABNORMAL HIGH (ref 65–99)
Glucose-Capillary: 146 mg/dL — ABNORMAL HIGH (ref 65–99)

## 2018-01-09 LAB — VITAMIN B1: VITAMIN B1 (THIAMINE): 56.5 nmol/L — AB (ref 66.5–200.0)

## 2018-01-09 LAB — VITAMIN B12: VITAMIN B 12: 2767 pg/mL — AB (ref 180–914)

## 2018-01-09 SURGERY — IRRIGATION AND DEBRIDEMENT EXTREMITY
Anesthesia: General | Site: Leg Lower | Laterality: Left

## 2018-01-09 MED ORDER — BISACODYL 10 MG RE SUPP: 10.0000 mg | Freq: Every day | RECTAL | Status: DC | PRN

## 2018-01-09 MED ORDER — MAGNESIUM CITRATE PO SOLN: 1.0000 | Freq: Once | ORAL | Status: DC | PRN

## 2018-01-09 MED ORDER — MORPHINE SULFATE (PF) 2 MG/ML IV SOLN
0.5000 mg | INTRAVENOUS | Status: DC | PRN
  Administered 2018-01-09 – 2018-01-24 (×19): 1 mg via INTRAVENOUS
  Filled 2018-01-09 (×22): qty 1

## 2018-01-09 MED ORDER — PROPOFOL 10 MG/ML IV BOLUS
INTRAVENOUS | Status: DC | PRN
  Administered 2018-01-09: 200 mg via INTRAVENOUS

## 2018-01-09 MED ORDER — DOCUSATE SODIUM 100 MG PO CAPS
100.0000 mg | ORAL_CAPSULE | Freq: Two times a day (BID) | ORAL | Status: DC
  Administered 2018-01-09 – 2018-01-17 (×15): 100 mg via ORAL
  Filled 2018-01-09 (×15): qty 1

## 2018-01-09 MED ORDER — FENTANYL CITRATE (PF) 250 MCG/5ML IJ SOLN
INTRAMUSCULAR | Status: AC
  Filled 2018-01-09: qty 5

## 2018-01-09 MED ORDER — EPHEDRINE SULFATE 50 MG/ML IJ SOLN
INTRAMUSCULAR | Status: DC | PRN
  Administered 2018-01-09: 10 mg via INTRAVENOUS

## 2018-01-09 MED ORDER — DEXAMETHASONE SODIUM PHOSPHATE 10 MG/ML IJ SOLN
INTRAMUSCULAR | Status: DC | PRN
  Administered 2018-01-09: 10 mg via INTRAVENOUS

## 2018-01-09 MED ORDER — LIDOCAINE HCL (CARDIAC) 20 MG/ML IV SOLN
INTRAVENOUS | Status: DC | PRN
  Administered 2018-01-09: 50 mg via INTRAVENOUS

## 2018-01-09 MED ORDER — HYDROCODONE-ACETAMINOPHEN 7.5-325 MG PO TABS
1.0000 | ORAL_TABLET | ORAL | Status: DC | PRN
  Administered 2018-01-11 – 2018-01-14 (×7): 2 via ORAL
  Administered 2018-01-15: 1 via ORAL
  Administered 2018-01-15 (×2): 2 via ORAL
  Filled 2018-01-09 (×10): qty 2

## 2018-01-09 MED ORDER — ONDANSETRON HCL 4 MG/2ML IJ SOLN: 4.0000 mg | Freq: Four times a day (QID) | INTRAMUSCULAR | Status: DC | PRN

## 2018-01-09 MED ORDER — GLYCOPYRROLATE 0.2 MG/ML IJ SOLN
INTRAMUSCULAR | Status: DC | PRN
  Administered 2018-01-09: 0.2 mg via INTRAVENOUS

## 2018-01-09 MED ORDER — 0.9 % SODIUM CHLORIDE (POUR BTL) OPTIME
TOPICAL | Status: DC | PRN
  Administered 2018-01-09: 1000 mL

## 2018-01-09 MED ORDER — LACTATED RINGERS IV SOLN
INTRAVENOUS | Status: DC | PRN
  Administered 2018-01-09: 10:00:00 via INTRAVENOUS

## 2018-01-09 MED ORDER — SODIUM CHLORIDE 0.9 % IR SOLN
Status: DC | PRN
  Administered 2018-01-09: 3000 mL

## 2018-01-09 MED ORDER — METOCLOPRAMIDE HCL 5 MG PO TABS: 5.0000 mg | ORAL_TABLET | Freq: Three times a day (TID) | ORAL | Status: DC | PRN

## 2018-01-09 MED ORDER — POLYETHYLENE GLYCOL 3350 17 G PO PACK
17.0000 g | PACK | Freq: Every day | ORAL | Status: DC | PRN
  Filled 2018-01-09: qty 1

## 2018-01-09 MED ORDER — METHOCARBAMOL 1000 MG/10ML IJ SOLN
500.0000 mg | Freq: Four times a day (QID) | INTRAVENOUS | Status: DC | PRN
  Filled 2018-01-09: qty 5

## 2018-01-09 MED ORDER — METOCLOPRAMIDE HCL 5 MG/ML IJ SOLN: 5.0000 mg | Freq: Three times a day (TID) | INTRAMUSCULAR | Status: DC | PRN

## 2018-01-09 MED ORDER — HYDROCODONE-ACETAMINOPHEN 5-325 MG PO TABS
1.0000 | ORAL_TABLET | ORAL | Status: DC | PRN
  Administered 2018-01-12 – 2018-01-14 (×3): 2 via ORAL
  Filled 2018-01-09 (×3): qty 2

## 2018-01-09 MED ORDER — ACETAMINOPHEN 325 MG PO TABS: 325.0000 mg | ORAL_TABLET | Freq: Four times a day (QID) | ORAL | Status: DC | PRN

## 2018-01-09 MED ORDER — HYDROMORPHONE HCL 1 MG/ML IJ SOLN
0.2500 mg | INTRAMUSCULAR | Status: DC | PRN
  Administered 2018-01-09 (×2): 0.5 mg via INTRAVENOUS

## 2018-01-09 MED ORDER — HYDROMORPHONE HCL 1 MG/ML IJ SOLN
INTRAMUSCULAR | Status: AC
  Filled 2018-01-09: qty 1

## 2018-01-09 MED ORDER — MIDAZOLAM HCL 2 MG/2ML IJ SOLN
INTRAMUSCULAR | Status: AC
  Filled 2018-01-09: qty 2

## 2018-01-09 MED ORDER — CEFAZOLIN SODIUM-DEXTROSE 2-4 GM/100ML-% IV SOLN
2.0000 g | INTRAVENOUS | Status: AC
  Administered 2018-01-09: 2 g via INTRAVENOUS
  Filled 2018-01-09: qty 100

## 2018-01-09 MED ORDER — MIDAZOLAM HCL 5 MG/5ML IJ SOLN
INTRAMUSCULAR | Status: DC | PRN
  Administered 2018-01-09: 2 mg via INTRAVENOUS

## 2018-01-09 MED ORDER — FENTANYL CITRATE (PF) 250 MCG/5ML IJ SOLN
INTRAMUSCULAR | Status: DC | PRN
  Administered 2018-01-09 (×5): 50 ug via INTRAVENOUS

## 2018-01-09 MED ORDER — METHOCARBAMOL 500 MG PO TABS
500.0000 mg | ORAL_TABLET | Freq: Four times a day (QID) | ORAL | Status: DC | PRN
  Administered 2018-01-09 – 2018-01-10 (×4): 500 mg via ORAL
  Filled 2018-01-09 (×4): qty 1

## 2018-01-09 MED ORDER — PHENYLEPHRINE HCL 10 MG/ML IJ SOLN
INTRAMUSCULAR | Status: DC | PRN
  Administered 2018-01-09 (×2): 40 ug via INTRAVENOUS

## 2018-01-09 MED ORDER — ONDANSETRON HCL 4 MG/2ML IJ SOLN
INTRAMUSCULAR | Status: DC | PRN
  Administered 2018-01-09: 4 mg via INTRAVENOUS

## 2018-01-09 MED ORDER — PROPOFOL 10 MG/ML IV BOLUS
INTRAVENOUS | Status: AC
  Filled 2018-01-09: qty 20

## 2018-01-09 MED ORDER — CHLORHEXIDINE GLUCONATE 4 % EX LIQD
60.0000 mL | Freq: Once | CUTANEOUS | Status: AC
  Administered 2018-01-09: 4 via TOPICAL

## 2018-01-09 MED ORDER — SODIUM CHLORIDE 0.9 % IV SOLN
INTRAVENOUS | Status: DC
  Administered 2018-01-09: 14:00:00 via INTRAVENOUS

## 2018-01-09 MED ORDER — ONDANSETRON HCL 4 MG PO TABS: 4.0000 mg | ORAL_TABLET | Freq: Four times a day (QID) | ORAL | Status: DC | PRN

## 2018-01-09 SURGICAL SUPPLY — 39 items
BENZOIN TINCTURE PRP APPL 2/3 (GAUZE/BANDAGES/DRESSINGS) ×28 IMPLANT
BLADE SAW RECIP 87.9 MT (BLADE) ×4 IMPLANT
BLADE SAW SGTL 18.5X63.X.64 HD (BLADE) ×4 IMPLANT
BLADE SURG 21 STRL SS (BLADE) ×4 IMPLANT
CANISTER WOUND CARE 500ML ATS (WOUND CARE) ×4 IMPLANT
CASSETTE VERAFLO VERALINK (MISCELLANEOUS) ×4 IMPLANT
COVER SURGICAL LIGHT HANDLE (MISCELLANEOUS) ×4 IMPLANT
DRAPE U-SHAPE 47X51 STRL (DRAPES) ×4 IMPLANT
DRESSING PREVENA PLUS CUSTOM (GAUZE/BANDAGES/DRESSINGS) ×2 IMPLANT
DRESSING VERAFLO CLEANSE CC (GAUZE/BANDAGES/DRESSINGS) ×4 IMPLANT
DRSG PREVENA PLUS CUSTOM (GAUZE/BANDAGES/DRESSINGS) ×4
DRSG VERAFLO CLEANSE CC (GAUZE/BANDAGES/DRESSINGS) ×8
ELECT REM PT RETURN 9FT ADLT (ELECTROSURGICAL) ×4
ELECTRODE REM PT RTRN 9FT ADLT (ELECTROSURGICAL) ×2 IMPLANT
GLOVE BIO SURGEON STRL SZ 6.5 (GLOVE) ×3 IMPLANT
GLOVE BIO SURGEONS STRL SZ 6.5 (GLOVE) ×1
GLOVE BIOGEL PI IND STRL 6.5 (GLOVE) ×2 IMPLANT
GLOVE BIOGEL PI IND STRL 7.5 (GLOVE) ×2 IMPLANT
GLOVE BIOGEL PI IND STRL 9 (GLOVE) ×2 IMPLANT
GLOVE BIOGEL PI INDICATOR 6.5 (GLOVE) ×2
GLOVE BIOGEL PI INDICATOR 7.5 (GLOVE) ×2
GLOVE BIOGEL PI INDICATOR 9 (GLOVE) ×2
GLOVE SURG ORTHO 9.0 STRL STRW (GLOVE) ×4 IMPLANT
GLOVE SURG SS PI 6.5 STRL IVOR (GLOVE) ×4 IMPLANT
GOWN STRL REUS W/ TWL XL LVL3 (GOWN DISPOSABLE) ×4 IMPLANT
GOWN STRL REUS W/TWL XL LVL3 (GOWN DISPOSABLE) ×4
HANDPIECE INTERPULSE COAX TIP (DISPOSABLE) ×2
KIT BASIN OR (CUSTOM PROCEDURE TRAY) ×4 IMPLANT
KIT TURNOVER KIT B (KITS) ×4 IMPLANT
MANIFOLD NEPTUNE II (INSTRUMENTS) ×4 IMPLANT
NS IRRIG 1000ML POUR BTL (IV SOLUTION) ×4 IMPLANT
PACK ORTHO EXTREMITY (CUSTOM PROCEDURE TRAY) ×4 IMPLANT
PAD ARMBOARD 7.5X6 YLW CONV (MISCELLANEOUS) ×4 IMPLANT
SET HNDPC FAN SPRY TIP SCT (DISPOSABLE) ×2 IMPLANT
SOLUTION BETADINE 4OZ (MISCELLANEOUS) ×4 IMPLANT
SUT ETHILON 2 0 PSLX (SUTURE) ×16 IMPLANT
TUBE CONNECTING 12'X1/4 (SUCTIONS) ×1
TUBE CONNECTING 12X1/4 (SUCTIONS) ×3 IMPLANT
YANKAUER SUCT BULB TIP NO VENT (SUCTIONS) ×4 IMPLANT

## 2018-01-09 NOTE — Progress Notes (Signed)
OT Cancellation Note  Patient Details Name: Aaron Mcconnell MRN: [id number] DOB: 11/20/1975   Cancelled Treatment:    Reason Eval/Treat Not Completed: Patient at procedure or test/ unavailable(at surgery)   Aaron Mcconnell, Aaron Mcconnell 01/09/2018, 11:08 AM   Aaron Mcconnell, Aaron Mcconnell   OTR/L Pager: [phone] Office: [phone] .

## 2018-01-09 NOTE — Progress Notes (Signed)
Pt transported to short stay. Mother is with him to hold glasses when ready.

## 2018-01-09 NOTE — Progress Notes (Signed)
Orthopedic Tech Progress Note Patient Details:  Aaron Mcconnell 02/10/1976 [id number]  Ortho Devices Type of Ortho Device: Prafo boot/shoe Ortho Device/Splint Location: lle Ortho Device/Splint Interventions: Application   Post Interventions Patient Tolerated: Well Instructions Provided: Care of device   [name], [name] 01/09/2018, 3:22 PM

## 2018-01-09 NOTE — Anesthesia Postprocedure Evaluation (Signed)
Anesthesia Post Note  Patient: Aaron Mcconnell  Procedure(s) Performed: IRRIGATION AND DEBRIDEMENT LEFT LEG (Left Leg Lower) APPLICATION OF WOUND VAC (Left Leg Lower)     Patient location during evaluation: PACU Anesthesia Type: General Level of consciousness: awake and alert Pain management: pain level controlled Vital Signs Assessment: post-procedure vital signs reviewed and stable Respiratory status: spontaneous breathing, nonlabored ventilation and respiratory function stable Cardiovascular status: blood pressure returned to baseline and stable Postop Assessment: no apparent nausea or vomiting Anesthetic complications: no    Last Vitals:  Vitals:   01/09/18 1155 01/09/18 1300  BP: 117/80 130/86  Pulse: 78 71  Resp: 18 18  Temp: 36.7 C 36.7 C  SpO2: 100% 97%    Last Pain:  Vitals:   01/09/18 1300  TempSrc: Oral  PainSc:                  Aaron Mcconnell,W. EDMOND

## 2018-01-09 NOTE — Progress Notes (Signed)
Hypoglycemic Event  CBG: 64  Treatment: NA  Symptoms: Pt asymptomatic  Follow-up CBG: Time: NA pt gone to surgery  CBG Result: NA  Possible Reasons for Event: Pt going to surgery and has been NPO since MN  Comments/MD notified: Dr. Lajoyce Cornersuda made aware. Okay for surgery this AM    Phil DoppMichelle Velda Wendt

## 2018-01-09 NOTE — Transfer of Care (Signed)
Immediate Anesthesia Transfer of Care Note  Patient: Aaron Mcconnell  Procedure(s) Performed: IRRIGATION AND DEBRIDEMENT LEFT LEG (Left Leg Lower) APPLICATION OF WOUND VAC (Left Leg Lower)  Patient Location: PACU  Anesthesia Type:General  Level of Consciousness: awake, alert  and oriented  Airway & Oxygen Therapy: Patient Spontanous Breathing  Post-op Assessment: Report given to RN and Post -op Vital signs reviewed and stable  Post vital signs: Reviewed and stable  Last Vitals:  Vitals:   01/08/18 2022 01/09/18 0558  BP: 111/67 131/64  Pulse: (!) 59 (!) 52  Resp: 17 17  Temp: 37 C 36.9 C  SpO2: 100% 100%    Last Pain:  Vitals:   01/09/18 0558  TempSrc: Oral  PainSc:       Patients Stated Pain Goal: 3 (01/06/18 1345)  Complications: No apparent anesthesia complications

## 2018-01-09 NOTE — Op Note (Signed)
01/09/2018  12:51 PM  PATIENT:  Aaron Mcconnell    PRE-OPERATIVE DIAGNOSIS:  Necrotizing Fascitis Left Leg  POST-OPERATIVE DIAGNOSIS:  Same  PROCEDURE:  IRRIGATION AND DEBRIDEMENT LEFT LEG, APPLICATION OF WOUND VAC, partial calcaneal excision, excision muscle bone skin and fascia sharply with a rondure and a 21 blade knife.,  Application of an installation wound VAC greater than 50 cm.  SURGEON:  Nadara MustardMarcus V Solace Wendorff, MD  PHYSICIAN ASSISTANT:None ANESTHESIA:   General  PREOPERATIVE INDICATIONS:  Aaron PhoJay Randall Kleve is a  42 y.o. male with a diagnosis of Necrotizing Fascitis Left Leg who failed conservative measures and elected for surgical management.    The risks benefits and alternatives were discussed with the patient preoperatively including but not limited to the risks of infection, bleeding, nerve injury, cardiopulmonary complications, the need for revision surgery, among others, and the patient was willing to proceed.  OPERATIVE IMPLANTS: Installation wound VAC with cleanse choice dressing.  @ENCIMAGES @  OPERATIVE FINDINGS: More necrotic muscle and fascia was excised exposed calcaneus was excised.  Patient had an ulcer on the plantar aspect of his heel that appeared to be the source of the infection that started in the calcaneus.  OPERATIVE PROCEDURE: Patient was brought to the operating room and underwent a general anesthetic.  After adequate levels of anesthesia were obtained patient's left lower extremity was prepped using Betadine paint and draped into a sterile field.  The previous wound VAC was removed prior to prepping and draping.  A timeout was called.  Patient still had nonviable muscle in the posterior and lateral compartments.  A 21 blade knife and rondure were used to further debride the necrotic muscle.  The wound was irrigated with pulsatile lavage.  The wound extended up proximal to the knee joint.  There is no signs of any purulence.  There is a large portion of the  calcaneus that was infected and nonviable.  Patient underwent a partial calcaneal excision with an oscillating saw.  The plantar ulcer that was associated with the calcaneal infection was excised and the tissue was reapproximated using 2-0 nylon.  The reticulated foam was placed against the wound.  The wound was then partially closed over the reticulated foam and the solid foam was applied over this.  A installation wound VAC was applied with 16 cc of irrigant.  This had a good suction fit and irrigated well.  Patient was extubated taken the PACU in stable condition.   DISCHARGE PLANNING:  Antibiotic duration: Continue IV antibiotics  Weightbearing: Nonweightbearing on the left  Pain medication: As prescribed  Dressing care/ Wound VAC: Installation wound VAC  Ambulatory devices: As needed  Discharge to: Plan for repeat surgery on Friday.  If the heel pad is nonviable will need to consider a transtibial amputation if the heel pad is viable and we will continue with limb salvage.  The heel pad was dusky at time of surgery.  Follow-up: In the office 1 week post operative.

## 2018-01-09 NOTE — Anesthesia Procedure Notes (Signed)
Procedure Name: LMA Insertion Date/Time: 01/09/2018 9:51 AM Performed by: Izola Priceockfield, Ludell Zacarias Walton Jr., CRNA Pre-anesthesia Checklist: Patient identified, Emergency Drugs available, Suction available, Patient being monitored and Timeout performed Patient Re-evaluated:Patient Re-evaluated prior to induction Oxygen Delivery Method: Circle system utilized Preoxygenation: Pre-oxygenation with 100% oxygen Induction Type: IV induction Ventilation: Mask ventilation without difficulty LMA: LMA inserted LMA Size: 5.0 Number of attempts: 1 Placement Confirmation: breath sounds checked- equal and bilateral and positive ETCO2 Tube secured with: Tape Dental Injury: Teeth and Oropharynx as per pre-operative assessment

## 2018-01-09 NOTE — Interval H&P Note (Signed)
History and Physical Interval Note:  01/09/2018 6:24 AM  Aaron Mcconnell  has presented today for surgery, with the diagnosis of Necrotizing Fascitis Left Leg  The various methods of treatment have been discussed with the patient and family. After consideration of risks, benefits and other options for treatment, the patient has consented to  Procedure(s): IRRIGATION AND DEBRIDEMENT LEFT LEG (Left) POSSIBLE AMPUTATION BELOW KNEE (Left) as a surgical intervention .  The patient's history has been reviewed, patient examined, no change in status, stable for surgery.  I have reviewed the patient's chart and labs.  Questions were answered to the patient's satisfaction.     Nadara MustardMarcus V Anahis Furgeson

## 2018-01-10 ENCOUNTER — Telehealth (INDEPENDENT_AMBULATORY_CARE_PROVIDER_SITE_OTHER): Payer: Self-pay | Admitting: Radiology

## 2018-01-10 ENCOUNTER — Encounter (HOSPITAL_COMMUNITY): Payer: Self-pay | Admitting: Orthopedic Surgery

## 2018-01-10 LAB — BASIC METABOLIC PANEL
Anion gap: 8 (ref 5–15)
BUN: 8 mg/dL (ref 6–20)
CHLORIDE: 102 mmol/L (ref 101–111)
CO2: 27 mmol/L (ref 22–32)
CREATININE: 0.69 mg/dL (ref 0.61–1.24)
Calcium: 8.1 mg/dL — ABNORMAL LOW (ref 8.9–10.3)
Glucose, Bld: 123 mg/dL — ABNORMAL HIGH (ref 65–99)
POTASSIUM: 3.9 mmol/L (ref 3.5–5.1)
SODIUM: 137 mmol/L (ref 135–145)

## 2018-01-10 LAB — GLUCOSE, CAPILLARY
GLUCOSE-CAPILLARY: 100 mg/dL — AB (ref 65–99)
GLUCOSE-CAPILLARY: 104 mg/dL — AB (ref 65–99)
GLUCOSE-CAPILLARY: 136 mg/dL — AB (ref 65–99)
Glucose-Capillary: 117 mg/dL — ABNORMAL HIGH (ref 65–99)
Glucose-Capillary: 99 mg/dL (ref 65–99)

## 2018-01-10 LAB — AEROBIC/ANAEROBIC CULTURE (SURGICAL/DEEP WOUND)

## 2018-01-10 LAB — VANCOMYCIN, TROUGH: Vancomycin Tr: 26 ug/mL (ref 15–20)

## 2018-01-10 LAB — AEROBIC/ANAEROBIC CULTURE W GRAM STAIN (SURGICAL/DEEP WOUND)

## 2018-01-10 MED ORDER — MUPIROCIN 2 % EX OINT
1.0000 "application " | TOPICAL_OINTMENT | Freq: Two times a day (BID) | CUTANEOUS | Status: AC
  Administered 2018-01-10 – 2018-01-14 (×9): 1 via NASAL
  Filled 2018-01-10: qty 22

## 2018-01-10 MED ORDER — CHLORHEXIDINE GLUCONATE CLOTH 2 % EX PADS
6.0000 | MEDICATED_PAD | Freq: Every day | CUTANEOUS | Status: AC
  Administered 2018-01-10 – 2018-01-14 (×4): 6 via TOPICAL

## 2018-01-10 MED ORDER — CHLORHEXIDINE GLUCONATE 4 % EX LIQD
60.0000 mL | Freq: Once | CUTANEOUS | Status: AC
  Administered 2018-01-11: 4 via TOPICAL

## 2018-01-10 NOTE — Evaluation (Signed)
Occupational Therapy Evaluation Patient Details Name: Aaron Mcconnell MRN: [id number] DOB: 22-May-1976 Today's Date: 01/10/2018    History of Present Illness Admitted with LLE extensive infection, necrotizing fasciitis; Now s/p decompression with extensive debridement and VAC placement;  has no past medical history on file.   Clinical Impression   This 42 y/o Pt presents with the above. At baseline pt is independent with ADLs and functional mobility, lives alone. Bed level/EOB eval completed this session as pt preparing for additional procedure on 3/22 and does not want to risk putting wt through LLE with mobility attempts prior. Pt completing bed mobility with overall supervision, demonstrates some generalized weakness in bil UEs; currently requires setup assist for UB ADLs and ModA for LB ADLs at bed level. Will continue to follow pt while in acute setting to assess functional transfers and to maximize his safety and independence with ADLs. Pt will benefit from additional HHOT services after discharge home to progress pt towards PLOF.     Follow Up Recommendations  Home health OT;Supervision/Assistance - 24 hour(24 hr initially )    Equipment Recommendations  3 in 1 bedside commode           Precautions / Restrictions Precautions Precautions: Fall Restrictions Weight Bearing Restrictions: Yes LLE Weight Bearing: Non weight bearing      Mobility Bed Mobility Overal bed mobility: Needs Assistance Bed Mobility: Supine to Sit;Sit to Supine     Supine to sit: Supervision Sit to supine: Supervision   General bed mobility comments: Supervision for safety and lines                                                                   ADL either performed or assessed with clinical judgement   ADL Overall ADL's : Needs assistance/impaired Eating/Feeding: Modified independent;Sitting   Grooming: Set up;Sitting   Upper Body Bathing: Min  guard;Sitting   Lower Body Bathing: Minimal assistance;Sitting/lateral leans   Upper Body Dressing : Min guard;Sitting   Lower Body Dressing: Moderate assistance;Sitting/lateral leans                 General ADL Comments: pt demonstrating bed mobility during session; bed level/EOB eval completed as pt reports MD does not want him to attempt standing until after planned procedure tomorrow (3/22) in order to protect LLE and ensure NWB through LE; POC discussed and theraband issued with education provided on Bil UE HEP to complete for increased UB strengthening in preparation for ADL/functional mobility completion      Vision Baseline Vision/History: Wears glasses                  Pertinent Vitals/Pain Pain Assessment: Faces Faces Pain Scale: Hurts a little bit Pain Location: LLE/calf Pain Descriptors / Indicators: Sore Pain Intervention(s): Monitored during session          Extremity/Trunk Assessment Upper Extremity Assessment Upper Extremity Assessment: Generalized weakness   Lower Extremity Assessment Lower Extremity Assessment: Defer to PT evaluation RLE Deficits / Details: VAC dressing in place LLE Deficits / Details: Noted some toe deformities; PRAFO donned, able to flex/extend toes       Communication Communication Communication: No difficulties   Cognition Arousal/Alertness: Awake/alert Behavior During Therapy: WFL for tasks assessed/performed Overall Cognitive Status: Within Functional  Limits for tasks assessed                                           Exercises  Other Exercises: issued level 2 theraband and instructed pt in bil UE HEP with min verbal/demonstrational cues, pt completing across various planes including shoulder flexion, abduction, horizontal abduction          Home Living Family/patient expects to be discharged to:: Private residence Living Arrangements: Alone Available Help at Discharge:  Family;Friend(s);Available PRN/intermittently(has someone who will stay the first week, parents live close by ) Type of Home: House Home Access: Level entry(to basement level )     Home Layout: Multi-level;Able to live on main level with bedroom/bathroom Alternate Level Stairs-Number of Steps: 12   Bathroom Shower/Tub: Walk-in shower         Home Equipment: Grab bars - tub/shower;None;Shower seat - built in   Additional Comments: Staying in his basement den where there is also a bathroom and kitchen      Prior Functioning/Environment Level of Independence: Independent                 OT Problem List: Decreased strength;Decreased activity tolerance;Decreased knowledge of use of DME or AE;Decreased range of motion;Impaired balance (sitting and/or standing)      OT Treatment/Interventions: Self-care/ADL training;DME and/or AE instruction;Therapeutic activities;Balance training;Therapeutic exercise;Energy conservation;Patient/family education    OT Goals(Current goals can be found in the care plan section) Acute Rehab OT Goals Patient Stated Goal: hopes to be able to use crutches; wants to get back to work OT Goal Formulation: With patient Time For Goal Achievement: 01/24/18 Potential to Achieve Goals: Good  OT Frequency: Min 2X/week                             AM-PAC PT "6 Clicks" Daily Activity     Outcome Measure Help from another person eating meals?: None Help from another person taking care of personal grooming?: None Help from another person toileting, which includes using toliet, bedpan, or urinal?: A Little Help from another person bathing (including washing, rinsing, drying)?: A Lot Help from another person to put on and taking off regular upper body clothing?: None Help from another person to put on and taking off regular lower body clothing?: A Lot 6 Click Score: 19   End of Session Nurse Communication: Mobility status  Activity Tolerance:  Patient tolerated treatment well Patient left: in bed;with call bell/phone within reach  OT Visit Diagnosis: Other abnormalities of gait and mobility (R26.89)                Time: 9562-13081023-1045 OT Time Calculation (min): 22 min Charges:  OT General Charges $OT Visit: 1 Visit OT Evaluation $OT Eval Moderate Complexity: 1 Mod G-Codes:     Marcy SirenBreanna Clair [place], OT Pager [phone] 01/10/2018   Orlando PennerBreanna L Kyreese Chio 01/10/2018, 12:59 PM

## 2018-01-10 NOTE — Progress Notes (Signed)
Pharmacy Antibiotic Note  Aaron HiltJay Randall Davisis a42 y.o.maleadmittedon 3/16/2019with sepsis from wound infection and left leg nec fasc. Pharmacy has been consulted for Vancomycin/Zosyndosing. [facility] WBC wnl. Afebrile. An 8 hr trough drawn this afternoon was supratherapeutic at 26. Dose was hung after level.   S/p I&D of left leg and wound VAC application on 3/20.  Plan: Hold vancomycin. Obtain a random vancomycin level in AM Continue Zosyn 3.375 gm IV Q 8 hours (EI infusion)  Monitor CBC, renal fx, cultures and clinical progress Plans to return to surgery on Friday   Weight: ~99 kg (per pt)  Temp (24hrs), Avg:98.2 F (36.8 C), Min:98 F (36.7 C), Max:98.5 F (36.9 C)  Recent Labs  Lab 01/05/18 0628  01/06/18 0518 01/07/18 0542 01/07/18 1338 01/08/18 0628 01/08/18 1218 01/10/18 0646 01/10/18 1323  WBC 13.1*  --  8.3 6.2  --  6.9 10.0  --   --   CREATININE 0.89  --  0.77  --   --   --   --  0.69  --   VANCOTROUGH  --    < >  --  52* 17  --   --   --  26*   < > = values in this interval not displayed.    CrCl cannot be calculated (Unknown ideal weight.).    Allergies  Allergen Reactions  . Erythromycin Itching  . Oxycodone Itching    Vinnie LevelBenjamin Dalphine Mcconnell, PharmD., BCPS Clinical Pharmacist Clinical phone for 01/10/18 until 3:30pm: [phone] If after 3:30pm, please call main pharmacy at: [phone]

## 2018-01-10 NOTE — Plan of Care (Signed)
  Problem: Education: Goal: Knowledge of General Education information will improve Outcome: Progressing   Problem: Health Behavior/Discharge Planning: Goal: Ability to manage health-related needs will improve Outcome: Progressing   Problem: Clinical Measurements: Goal: Ability to maintain clinical measurements within normal limits will improve Outcome: Progressing Goal: Will remain free from infection Outcome: Progressing Goal: Diagnostic test results will improve Outcome: Progressing Goal: Respiratory complications will improve Outcome: Progressing Goal: Cardiovascular complication will be avoided Outcome: Progressing   Problem: Activity: Goal: Risk for activity intolerance will decrease Outcome: Progressing   Problem: Nutrition: Goal: Adequate nutrition will be maintained Outcome: Progressing   Problem: Coping: Goal: Level of anxiety will decrease Outcome: Progressing   Problem: Pain Managment: Goal: General experience of comfort will improve Outcome: Progressing   Problem: Safety: Goal: Ability to remain free from injury will improve Outcome: Progressing   Problem: Skin Integrity: Goal: Risk for impaired skin integrity will decrease Outcome: Progressing   

## 2018-01-10 NOTE — H&P (View-Only) (Signed)
Patient ID: Aaron Mcconnell, male   DOB: 10/26/1975, 42 y.o.   MRN: [id number] Patient is postoperative day 1 for left lower extremity debridement necrotizing fasciitis with limb salvage intervention.  Patient was placed in a PRAFO to unload pressure from the heel pad which is at risk of ischemic changes with pressure.  Patient was not wearing the PRAFO this morning.  Again discussed the importance of wearing this to protect the heel pad.  Discussed that if the heel pad does not survive limb salvage is not an option.  Plan to return to surgery on Friday.  Discussed proper diet for patients possible prediabetes with greens beans nuts and fruit.  Discussed the avoidance of sugars sodas and carbohydrates.  Discussed the importance of alcohol cessation. 

## 2018-01-10 NOTE — Progress Notes (Signed)
Patient ID: Aaron Mcconnell, male   DOB: 03/02/1976, 42 y.o.   MRN: [id number] Patient is postoperative day 1 for left lower extremity debridement necrotizing fasciitis with limb salvage intervention.  Patient was placed in a PRAFO to unload pressure from the heel pad which is at risk of ischemic changes with pressure.  Patient was not wearing the PRAFO this morning.  Again discussed the importance of wearing this to protect the heel pad.  Discussed that if the heel pad does not survive limb salvage is not an option.  Plan to return to surgery on Friday.  Discussed proper diet for patients possible prediabetes with greens beans nuts and fruit.  Discussed the avoidance of sugars sodas and carbohydrates.  Discussed the importance of alcohol cessation.

## 2018-01-10 NOTE — Telephone Encounter (Signed)
Annabelle Harmanana, [facility] called to notify Dr. Lajoyce Cornersuda that patients vancomycin trough is critically high at 26. I informed Dr. Lajoyce Cornersuda of results, he thanked nurse for notification however he is not managing patients vancomycin that is with the Hospitalist.

## 2018-01-10 NOTE — Plan of Care (Signed)
  Problem: Pain Managment: Goal: General experience of comfort will improve Outcome: Progressing   Problem: Safety: Goal: Ability to remain free from injury will improve Outcome: Progressing   

## 2018-01-10 NOTE — Consult Note (Signed)
WOC Nurse wound follow up:  Called to evaluate leaking VAC dressing.  Had leaked over night and this Am and was patched with additional drape.  Is leaking now and needs to be assessed.  Spoke with Dr Lajoyce Cornersuda who agreed that I could remove drape and assess leak.  Wound type: Surgical wound Measurement:Sponge intact.  Wound bed: not assessed Drainage (amount, consistency, odor) maceration noted around heel. Blood tinged liquid in canister.  Removed excess drape, Trac Pad and cleansed and dried heel.   Periwound:Intact Dressing procedure/placement/frequency:Replaced fresh layer of drape at distal end of dressing (malleolus and heel) and replaced TRAC pad.   Will not follow at this time.  Please re-consult if needed.  Aaron HudsonKaren Eeshan Verbrugge RN BSN [facility] Pager [phone]

## 2018-01-10 NOTE — Progress Notes (Signed)
Lab reports critical vanc trough of 26.  MD's office contacted and message left regarding this information

## 2018-01-10 NOTE — CV Procedure (Signed)
WOC Nurse wound follow up Taken back to OR yesterday for debridement left leg.  Treatment ongoing.   Right plantar foot with neuropathic ulcer. Continue Aquacel Ag to this daily.  Will not follow at this time.  Please re-consult if needed.  Maple HudsonKaren Alquan Morrish RN BSN [facility] Pager [phone]

## 2018-01-10 NOTE — Evaluation (Addendum)
Physical Therapy Evaluation Patient Details Name: Aaron Mcconnell MRN: [id number] DOB: 12/17/1975 Today's Date: 01/10/2018   History of Present Illness  Admitted with LLE extensive infection, necrotizing fasciitis; Now s/p decompression with extensive debridement and VAC placement;  has no past medical history on file.  Clinical Impression  Patient is s/p above surgery resulting in functional limitations due to the deficits listed below (see PT Problem List). At the time of PT evaluation, patient refusing OOB mobility secondary to fear of falling and placing weight through LLE prior to surgery. Session focused on lower extremity strengthening. Patient scheduled for calcaneal incision repeat surgery tomorrow.  Patient will benefit from skilled PT to increase their independence and safety with mobility to allow discharge to the venue listed below.       Follow Up Recommendations Home health PT;Supervision - Intermittent    Equipment Recommendations  Rolling walker with 5" wheels;3in1 (PT);Wheelchair (measurements PT);Other (comment)(hospital bed)    Recommendations for Other Services (will order per protocol)     Precautions / Restrictions Precautions Precautions: Fall Restrictions Weight Bearing Restrictions: Yes LLE Weight Bearing: Non weight bearing      Mobility  Bed Mobility Overal bed mobility: Needs Assistance Bed Mobility: Supine to Sit;Sit to Supine     Supine to sit: Supervision Sit to supine: Supervision   General bed mobility comments: Supervision for safety and lines  Transfers                    Ambulation/Gait                Stairs            Wheelchair Mobility    Modified Rankin (Stroke Patients Only)       Balance                                             Pertinent Vitals/Pain Pain Assessment: Faces Faces Pain Scale: Hurts a little bit Pain Location: LLE/calf Pain Descriptors / Indicators:  Sore Pain Intervention(s): Monitored during session    Home Living Family/patient expects to be discharged to:: Private residence Living Arrangements: Alone Available Help at Discharge: Family;Friend(s);Available PRN/intermittently(has someone who will stay the first week, parents live close by ) Type of Home: House Home Access: Level entry(to basement level )     Home Layout: Multi-level;Able to live on main level with bedroom/bathroom Home Equipment: Grab bars - tub/shower;None;Shower seat - built in Additional Comments: Staying in his basement den where there is also a bathroom and kitchen    Prior Function Level of Independence: Independent               Hand Dominance        Extremity/Trunk Assessment   Upper Extremity Assessment Upper Extremity Assessment: Generalized weakness    Lower Extremity Assessment Lower Extremity Assessment: Defer to PT evaluation RLE Deficits / Details: VAC dressing in place LLE Deficits / Details: Noted some toe deformities; PRAFO donned, able to flex/extend toes       Communication   Communication: No difficulties  Cognition Arousal/Alertness: Awake/alert Behavior During Therapy: WFL for tasks assessed/performed Overall Cognitive Status: Within Functional Limits for tasks assessed  General Comments      Exercises General Exercises - Lower Extremity Quad Sets: 15 reps;Both Gluteal Sets: 15 reps;Supine Heel Slides: 10 reps;Left Hip ABduction/ADduction: 10 reps;Left Straight Leg Raises: 10 reps;Left Other Exercises Other Exercises: issued level 2 theraband and instructed pt in bil UE HEP with min verbal/demonstrational cues, pt completing across various planes including shoulder flexion, abduction, horizontal abduction    Assessment/Plan    PT Assessment Patient needs continued PT services  PT Problem List Decreased strength;Decreased range of motion;Decreased  activity tolerance;Decreased balance;Decreased mobility;Decreased knowledge of use of DME;Decreased knowledge of precautions;Pain       PT Treatment Interventions DME instruction;Gait training;Stair training;Functional mobility training;Therapeutic activities;Therapeutic exercise;Patient/family education    PT Goals (Current goals can be found in the Care Plan section)  Acute Rehab PT Goals Patient Stated Goal: hopes to be able to use crutches; wants to get back to work PT Goal Formulation: With patient Time For Goal Achievement: 01/20/18 Potential to Achieve Goals: Good    Frequency Min 5X/week   Barriers to discharge Decreased caregiver support      Co-evaluation               AM-PAC PT "6 Clicks" Daily Activity  Outcome Measure Difficulty turning over in bed (including adjusting bedclothes, sheets and blankets)?: None Difficulty moving from lying on back to sitting on the side of the bed? : None Difficulty sitting down on and standing up from a chair with arms (e.g., wheelchair, bedside commode, etc,.)?: A Little Help needed moving to and from a bed to chair (including a wheelchair)?: A Lot Help needed walking in hospital room?: A Lot Help needed climbing 3-5 steps with a railing? : Total 6 Click Score: 16    End of Session   Activity Tolerance: Patient tolerated treatment well Patient left: in bed;with call bell/phone within reach Nurse Communication: Mobility status PT Visit Diagnosis: Other abnormalities of gait and mobility (R26.89);Pain;Muscle weakness (generalized) (M62.81) Pain - Right/Left: Left Pain - part of body: Leg    Time: 1610-9604 PT Time Calculation (min) (ACUTE ONLY): 17 min   Charges:   PT Evaluation $PT Eval Moderate Complexity: 1 Mod     PT G Codes:       Laurina Bustle, PT, DPT Acute Rehabilitation Services  Pager: [phone]   Vanetta Mulders 01/10/2018, 1:37 PM

## 2018-01-10 NOTE — Progress Notes (Signed)
Patient HR drops and sustains 39-40 patient is asymptomatic. Orthopedics paged and informed. [facility] also paged. Will continue to monitor. Ilean SkillVeronica Jerni Selmer LPN

## 2018-01-11 ENCOUNTER — Encounter (HOSPITAL_COMMUNITY)
Admission: AD | Disposition: A | Discharge status: Home Health | Payer: Self-pay | Source: Other Acute Inpatient Hospital | Attending: Orthopedic Surgery

## 2018-01-11 ENCOUNTER — Inpatient Hospital Stay (HOSPITAL_COMMUNITY): Payer: PRIVATE HEALTH INSURANCE | Admitting: Anesthesiology

## 2018-01-11 ENCOUNTER — Encounter (HOSPITAL_COMMUNITY): Payer: Self-pay | Admitting: Anesthesiology

## 2018-01-11 DIAGNOSIS — M726 Necrotizing fasciitis: Secondary | ICD-10-CM

## 2018-01-11 HISTORY — PX: I & D EXTREMITY: SHX5045

## 2018-01-11 LAB — BASIC METABOLIC PANEL
Anion gap: 10 (ref 5–15)
BUN: 8 mg/dL (ref 6–20)
CHLORIDE: 102 mmol/L (ref 101–111)
CO2: 28 mmol/L (ref 22–32)
CREATININE: 0.87 mg/dL (ref 0.61–1.24)
Calcium: 8.3 mg/dL — ABNORMAL LOW (ref 8.9–10.3)
GFR calc Af Amer: >60 mL/min (ref >60)
GFR calc non Af Amer: >60 mL/min (ref >60)
GLUCOSE: 98 mg/dL (ref 65–99)
Potassium: 3.5 mmol/L (ref 3.5–5.1)
Sodium: 140 mmol/L (ref 135–145)

## 2018-01-11 LAB — GLUCOSE, CAPILLARY
GLUCOSE-CAPILLARY: 181 mg/dL — AB (ref 65–99)
Glucose-Capillary: 91 mg/dL (ref 65–99)
Glucose-Capillary: 98 mg/dL (ref 65–99)

## 2018-01-11 LAB — VANCOMYCIN, RANDOM: Vancomycin Rm: 11

## 2018-01-11 SURGERY — IRRIGATION AND DEBRIDEMENT EXTREMITY
Anesthesia: General | Site: Leg Lower | Laterality: Left

## 2018-01-11 MED ORDER — METOCLOPRAMIDE HCL 5 MG PO TABS: 5.0000 mg | ORAL_TABLET | Freq: Three times a day (TID) | ORAL | Status: DC | PRN

## 2018-01-11 MED ORDER — EPHEDRINE SULFATE 50 MG/ML IJ SOLN
INTRAMUSCULAR | Status: DC | PRN
  Administered 2018-01-11: 10 mg via INTRAVENOUS

## 2018-01-11 MED ORDER — METHOCARBAMOL 500 MG PO TABS
500.0000 mg | ORAL_TABLET | Freq: Four times a day (QID) | ORAL | Status: DC | PRN
  Administered 2018-01-11 – 2018-01-15 (×13): 500 mg via ORAL
  Filled 2018-01-11 (×13): qty 1

## 2018-01-11 MED ORDER — MAGNESIUM CITRATE PO SOLN: 1.0000 | Freq: Once | ORAL | Status: DC | PRN

## 2018-01-11 MED ORDER — MIDAZOLAM HCL 5 MG/5ML IJ SOLN
INTRAMUSCULAR | Status: DC | PRN
  Administered 2018-01-11: 2 mg via INTRAVENOUS

## 2018-01-11 MED ORDER — ONDANSETRON HCL 4 MG PO TABS: 4.0000 mg | ORAL_TABLET | Freq: Four times a day (QID) | ORAL | Status: DC | PRN

## 2018-01-11 MED ORDER — ONDANSETRON HCL 4 MG/2ML IJ SOLN: 4.0000 mg | Freq: Four times a day (QID) | INTRAMUSCULAR | Status: DC | PRN

## 2018-01-11 MED ORDER — KETOROLAC TROMETHAMINE 30 MG/ML IJ SOLN: 30.0000 mg | Freq: Once | INTRAMUSCULAR | Status: DC | PRN

## 2018-01-11 MED ORDER — BISACODYL 10 MG RE SUPP: 10.0000 mg | Freq: Every day | RECTAL | Status: DC | PRN

## 2018-01-11 MED ORDER — POLYETHYLENE GLYCOL 3350 17 G PO PACK: 17.0000 g | PACK | Freq: Every day | ORAL | Status: DC | PRN

## 2018-01-11 MED ORDER — FENTANYL CITRATE (PF) 100 MCG/2ML IJ SOLN
INTRAMUSCULAR | Status: DC | PRN
  Administered 2018-01-11 (×2): 25 ug via INTRAVENOUS
  Administered 2018-01-11: 50 ug via INTRAVENOUS
  Administered 2018-01-11: 25 ug via INTRAVENOUS
  Administered 2018-01-11: 75 ug via INTRAVENOUS

## 2018-01-11 MED ORDER — ACETAMINOPHEN 160 MG/5ML PO SOLN: 325.0000 mg | ORAL | Status: DC | PRN

## 2018-01-11 MED ORDER — SODIUM CHLORIDE 0.9 % IV SOLN
INTRAVENOUS | Status: DC
  Administered 2018-01-11: 13:00:00 via INTRAVENOUS

## 2018-01-11 MED ORDER — OXYCODONE HCL 5 MG PO TABS: 5.0000 mg | ORAL_TABLET | Freq: Once | ORAL | Status: DC | PRN

## 2018-01-11 MED ORDER — VANCOMYCIN HCL 10 G IV SOLR
1250.0000 mg | Freq: Two times a day (BID) | INTRAVENOUS | Status: DC
  Administered 2018-01-11: 1250 mg via INTRAVENOUS
  Filled 2018-01-11: qty 1250

## 2018-01-11 MED ORDER — FENTANYL CITRATE (PF) 100 MCG/2ML IJ SOLN
25.0000 ug | INTRAMUSCULAR | Status: DC | PRN
  Administered 2018-01-11 (×2): 50 ug via INTRAVENOUS

## 2018-01-11 MED ORDER — ONDANSETRON HCL 4 MG/2ML IJ SOLN
INTRAMUSCULAR | Status: DC | PRN
  Administered 2018-01-11: 4 mg via INTRAVENOUS

## 2018-01-11 MED ORDER — METOCLOPRAMIDE HCL 5 MG/ML IJ SOLN: 5.0000 mg | Freq: Three times a day (TID) | INTRAMUSCULAR | Status: DC | PRN

## 2018-01-11 MED ORDER — MIDAZOLAM HCL 2 MG/2ML IJ SOLN
INTRAMUSCULAR | Status: AC
  Filled 2018-01-11: qty 2

## 2018-01-11 MED ORDER — FENTANYL CITRATE (PF) 250 MCG/5ML IJ SOLN
INTRAMUSCULAR | Status: AC
  Filled 2018-01-11: qty 5

## 2018-01-11 MED ORDER — 0.9 % SODIUM CHLORIDE (POUR BTL) OPTIME
TOPICAL | Status: DC | PRN
  Administered 2018-01-11: 1000 mL

## 2018-01-11 MED ORDER — DEXAMETHASONE SODIUM PHOSPHATE 10 MG/ML IJ SOLN
INTRAMUSCULAR | Status: AC
  Filled 2018-01-11: qty 2

## 2018-01-11 MED ORDER — ONDANSETRON HCL 4 MG/2ML IJ SOLN
INTRAMUSCULAR | Status: AC
  Filled 2018-01-11: qty 4

## 2018-01-11 MED ORDER — OXYCODONE HCL 5 MG/5ML PO SOLN: 5.0000 mg | Freq: Once | ORAL | Status: DC | PRN

## 2018-01-11 MED ORDER — DEXAMETHASONE SODIUM PHOSPHATE 10 MG/ML IJ SOLN
INTRAMUSCULAR | Status: DC | PRN
  Administered 2018-01-11: 10 mg via INTRAVENOUS

## 2018-01-11 MED ORDER — MEPERIDINE HCL 50 MG/ML IJ SOLN: 6.2500 mg | INTRAMUSCULAR | Status: DC | PRN

## 2018-01-11 MED ORDER — PROPOFOL 10 MG/ML IV BOLUS
INTRAVENOUS | Status: DC | PRN
  Administered 2018-01-11: 200 mg via INTRAVENOUS

## 2018-01-11 MED ORDER — ONDANSETRON HCL 4 MG/2ML IJ SOLN: 4.0000 mg | Freq: Once | INTRAMUSCULAR | Status: DC | PRN

## 2018-01-11 MED ORDER — SODIUM CHLORIDE 0.9 % IR SOLN
Status: DC | PRN
  Administered 2018-01-11: 3000 mL

## 2018-01-11 MED ORDER — FENTANYL CITRATE (PF) 100 MCG/2ML IJ SOLN
INTRAMUSCULAR | Status: AC
  Administered 2018-01-11: 11:00:00
  Filled 2018-01-11: qty 2

## 2018-01-11 MED ORDER — ACETAMINOPHEN 325 MG PO TABS: 325.0000 mg | ORAL_TABLET | ORAL | Status: DC | PRN

## 2018-01-11 MED ORDER — LIDOCAINE HCL (CARDIAC) 20 MG/ML IV SOLN
INTRAVENOUS | Status: DC | PRN
  Administered 2018-01-11: 100 mg via INTRAVENOUS

## 2018-01-11 MED ORDER — DOCUSATE SODIUM 100 MG PO CAPS: 100.0000 mg | ORAL_CAPSULE | Freq: Two times a day (BID) | ORAL | Status: DC

## 2018-01-11 MED ORDER — METHOCARBAMOL 1000 MG/10ML IJ SOLN
500.0000 mg | Freq: Four times a day (QID) | INTRAVENOUS | Status: DC | PRN
  Filled 2018-01-11: qty 5

## 2018-01-11 SURGICAL SUPPLY — 37 items
BLADE SURG 21 STRL SS (BLADE) ×3 IMPLANT
BNDG COHESIVE 6X5 TAN STRL LF (GAUZE/BANDAGES/DRESSINGS) IMPLANT
BNDG GAUZE ELAST 4 BULKY (GAUZE/BANDAGES/DRESSINGS) ×6 IMPLANT
CANISTER WOUNDNEG PRESSURE 500 (CANNISTER) ×2 IMPLANT
CASSETTE VERAFLO VERALINK (MISCELLANEOUS) ×2 IMPLANT
COVER SURGICAL LIGHT HANDLE (MISCELLANEOUS) ×8 IMPLANT
DRAPE INCISE IOBAN 66X45 STRL (DRAPES) ×4 IMPLANT
DRAPE U-SHAPE 47X51 STRL (DRAPES) ×3 IMPLANT
DRESSING VERAFLO CLEANSE CC (GAUZE/BANDAGES/DRESSINGS) IMPLANT
DRSG ADAPTIC 3X8 NADH LF (GAUZE/BANDAGES/DRESSINGS) ×3 IMPLANT
DRSG VERAFLO CLEANSE CC (GAUZE/BANDAGES/DRESSINGS) ×6
DURAPREP 26ML APPLICATOR (WOUND CARE) ×1 IMPLANT
ELECT REM PT RETURN 9FT ADLT (ELECTROSURGICAL)
ELECTRODE REM PT RTRN 9FT ADLT (ELECTROSURGICAL) IMPLANT
GAUZE SPONGE 4X4 12PLY STRL (GAUZE/BANDAGES/DRESSINGS) ×3 IMPLANT
GLOVE BIOGEL PI IND STRL 9 (GLOVE) ×1 IMPLANT
GLOVE BIOGEL PI INDICATOR 9 (GLOVE) ×2
GLOVE SURG ORTHO 9.0 STRL STRW (GLOVE) ×3 IMPLANT
GOWN STRL REUS W/ TWL XL LVL3 (GOWN DISPOSABLE) ×2 IMPLANT
GOWN STRL REUS W/TWL XL LVL3 (GOWN DISPOSABLE) ×4
HANDPIECE INTERPULSE COAX TIP (DISPOSABLE) ×2
KIT BASIN OR (CUSTOM PROCEDURE TRAY) ×5 IMPLANT
KIT ROOM TURNOVER OR (KITS) ×3 IMPLANT
MANIFOLD NEPTUNE II (INSTRUMENTS) ×3 IMPLANT
NS IRRIG 1000ML POUR BTL (IV SOLUTION) ×3 IMPLANT
PACK ORTHO EXTREMITY (CUSTOM PROCEDURE TRAY) ×3 IMPLANT
PAD ARMBOARD 7.5X6 YLW CONV (MISCELLANEOUS) ×6 IMPLANT
SET HNDPC FAN SPRY TIP SCT (DISPOSABLE) IMPLANT
SPONGE LAP 18X18 X RAY DECT (DISPOSABLE) ×2 IMPLANT
STOCKINETTE IMPERVIOUS 9X36 MD (GAUZE/BANDAGES/DRESSINGS) IMPLANT
SUT ETHILON 2 0 PSLX (SUTURE) ×12 IMPLANT
SWAB COLLECTION DEVICE MRSA (MISCELLANEOUS) ×1 IMPLANT
SWAB CULTURE ESWAB REG 1ML (MISCELLANEOUS) IMPLANT
TOWEL OR 17X26 10 PK STRL BLUE (TOWEL DISPOSABLE) ×3 IMPLANT
TUBE CONNECTING 12'X1/4 (SUCTIONS) ×1
TUBE CONNECTING 12X1/4 (SUCTIONS) ×2 IMPLANT
YANKAUER SUCT BULB TIP NO VENT (SUCTIONS) ×3 IMPLANT

## 2018-01-11 NOTE — Anesthesia Postprocedure Evaluation (Signed)
Anesthesia Post Note  Patient: Aaron Mcconnell  Procedure(s) Performed: REPEAT IRRIGATION AND DEBRIDEMENT LEFT LEG, APPLY WOUND VAC (Left Leg Lower)     Patient location during evaluation: PACU Anesthesia Type: General Level of consciousness: awake and alert Pain management: pain level controlled Vital Signs Assessment: post-procedure vital signs reviewed and stable Respiratory status: spontaneous breathing, nonlabored ventilation, respiratory function stable and patient connected to nasal cannula oxygen Cardiovascular status: blood pressure returned to baseline and stable Postop Assessment: no apparent nausea or vomiting Anesthetic complications: no    Last Vitals:  Vitals Value Taken Time  BP    Temp    Pulse    Resp    SpO2      Last Pain:  Vitals:   01/11/18 1233  TempSrc:   PainSc: 9                  Janani Chamber

## 2018-01-11 NOTE — Transfer of Care (Signed)
Immediate Anesthesia Transfer of Care Note  Patient: Aaron Mcconnell  Procedure(s) Performed: REPEAT IRRIGATION AND DEBRIDEMENT LEFT LEG, APPLY WOUND VAC (Left )  Patient Location: PACU  Anesthesia Type:General  Level of Consciousness: awake and alert   Airway & Oxygen Therapy: Patient connected to nasal cannula oxygen  Post-op Assessment: Report given to RN, Post -op Vital signs reviewed and stable and Patient moving all extremities X 4  Post vital signs: Reviewed and stable  Last Vitals:  Vitals Value Taken Time  BP 121/79 01/11/2018 10:49 AM  Temp    Pulse 69 01/11/2018 10:50 AM  Resp 25 01/11/2018 10:50 AM  SpO2 97 % 01/11/2018 10:50 AM  Vitals shown include unvalidated device data.  Last Pain:  Vitals:   01/11/18 0514  TempSrc: Oral  PainSc:       Patients Stated Pain Goal: 5 (01/10/18 1927)  Complications: No apparent anesthesia complications

## 2018-01-11 NOTE — Anesthesia Postprocedure Evaluation (Signed)
Anesthesia Post Note  Patient: Aaron Mcconnell  Procedure(s) Performed: REPEAT IRRIGATION AND DEBRIDEMENT LEFT LEG, APPLY WOUND VAC (Left Leg Lower)     Anesthesia Post Evaluation  Last Vitals:  Vitals Value Taken Time  BP    Temp    Pulse    Resp    SpO2      Last Pain:  Vitals:   01/11/18 1233  TempSrc:   PainSc: 9                  Cathye Kreiter

## 2018-01-11 NOTE — Progress Notes (Addendum)
Pharmacy Antibiotic Note  Aaron Mcconnell a42 y.o.maleadmittedon 3/16/2019with sepsis from wound infection and left leg nec fasc. Pharmacy has been consulted for vancomycin and Zosyndosing.  S/p multiple I&Ds.  Patient's renal function is stable,  Vancomycin random level is sub-therapeutic today.  Alcaligenes faecalis is sensitive to Zosyn per micro lab.   Plan: Schedule vanc 1250mg  IV Q12H Zosyn EID 3.375gm IV Q8H Monitor renal fxn, clinical progress, vanc trough as indicated F/U with discontinuing vanc since tissue culture grew GNR pathogen   Weight: ~99 kg (per pt)  Temp (24hrs), Avg:98.4 F (36.9 C), Min:97.3 F (36.3 C), Max:99.1 F (37.3 C)  Recent Labs  Lab 01/05/18 0628  01/06/18 0518 01/07/18 0542 01/07/18 1338 01/08/18 0628 01/08/18 1218 01/10/18 0646 01/10/18 1323 01/11/18 0630  WBC 13.1*  --  8.3 6.2  --  6.9 10.0  --   --   --   CREATININE 0.89  --  0.77  --   --   --   --  0.69  --  0.87  VANCOTROUGH  --    < >  --  52* 17  --   --   --  26*  --   VANCORANDOM  --   --   --   --   --   --   --   --   --  11   < > = values in this interval not displayed.    CrCl cannot be calculated (Unknown ideal weight.).    Allergies  Allergen Reactions  . Erythromycin Itching  . Oxycodone Itching    Vanc 3/16 >> Zosyn 3/16 >>  3/16 VT: 13, inc to 1250mg  q8 3/16 VT: 52, drawn while vanc infusing, re-time 3/16 VT: 17, continue 1250mg  q8 3/21 VT (8hr): 26, on 1250mg  q8 (dose hung after level)  3/22 VR = 11 mcg/mL >> 1250mg  q12  3/16 L-Leg wound cx - rare Alcaligenes faecalis (GNR) 3/18 surgical screen PCR - positive Staph aureus   Aaron Mcconnell, PharmD, BCPS Pager:  [phone] 01/11/2018, 12:21 PM

## 2018-01-11 NOTE — Addendum Note (Signed)
Addendum  created 01/11/18 1241 by Bethena Midgetddono, Stanton Kissoon, MD   Sign clinical note

## 2018-01-11 NOTE — Anesthesia Preprocedure Evaluation (Addendum)
Anesthesia Evaluation  Patient identified by MRN, date of birth, ID band Patient awake    Reviewed: Allergy & Precautions, H&P , NPO status , Patient's Chart, lab work & pertinent test results  Airway Mallampati: II  TM Distance: >3 FB Neck ROM: Full    Dental no notable dental hx. (+) Teeth Intact, Dental Advisory Given   Pulmonary neg pulmonary ROS,    Pulmonary exam normal breath sounds clear to auscultation       Cardiovascular Exercise Tolerance: Good negative cardio ROS   Rhythm:Regular Rate:Normal     Neuro/Psych negative neurological ROS  negative psych ROS   GI/Hepatic negative GI ROS, Neg liver ROS,   Endo/Other  negative endocrine ROS  Renal/GU negative Renal ROS  negative genitourinary   Musculoskeletal   Abdominal   Peds  Hematology negative hematology ROS (+)   Anesthesia Other Findings   Reproductive/Obstetrics negative OB ROS                             Anesthesia Physical  Anesthesia Plan  ASA: I  Anesthesia Plan: General   Post-op Pain Management:    Induction: Intravenous  PONV Risk Score and Plan: 3 and Ondansetron, Dexamethasone, Midazolam and Treatment may vary due to age or medical condition  Airway Management Planned: LMA  Additional Equipment:   Intra-op Plan:   Post-operative Plan: Extubation in OR  Informed Consent: I have reviewed the patients History and Physical, chart, labs and discussed the procedure including the risks, benefits and alternatives for the proposed anesthesia with the patient or authorized representative who has indicated his/her understanding and acceptance.   Dental advisory given and Dental Advisory Given  Plan Discussed with: CRNA, Anesthesiologist and Surgeon  Anesthesia Plan Comments:        Anesthesia Quick Evaluation

## 2018-01-11 NOTE — Interval H&P Note (Signed)
History and Physical Interval Note:  01/11/2018 7:16 AM  Aaron Mcconnell  has presented today for surgery, with the diagnosis of Necrotizing Fascitis Left Leg  The various methods of treatment have been discussed with the patient and family. After consideration of risks, benefits and other options for treatment, the patient has consented to  Procedure(s): REPEAT IRRIGATION AND DEBRIDEMENT LEFT LEG, APPLY WOUND VAC (Left) as a surgical intervention .  The patient's history has been reviewed, patient examined, no change in status, stable for surgery.  I have reviewed the patient's chart and labs.  Questions were answered to the patient's satisfaction.     Nadara MustardMarcus V Duda

## 2018-01-11 NOTE — Op Note (Signed)
01/05/2018 - 01/11/2018  10:41 AM  PATIENT:  Aaron PhoJay Randall Mckeone    PRE-OPERATIVE DIAGNOSIS:  Necrotizing Fascitis Left Leg  POST-OPERATIVE DIAGNOSIS:  Same  PROCEDURE:  REPEAT IRRIGATION AND DEBRIDEMENT LEFT LEG, APPLY WOUND VAC ,sharp excision of gastrocnemius and soleus muscle as well as fascia over the anterior compartment, application of installation wound VAC.  SURGEON:  Nadara MustardMarcus V Claxton Levitz, MD  PHYSICIAN ASSISTANT:None ANESTHESIA:   General  PREOPERATIVE INDICATIONS:  Aaron Mcconnell is a  42 y.o. male with a diagnosis of Necrotizing Fascitis Left Leg who failed conservative measures and elected for surgical management.    The risks benefits and alternatives were discussed with the patient preoperatively including but not limited to the risks of infection, bleeding, nerve injury, cardiopulmonary complications, the need for revision surgery, among others, and the patient was willing to proceed.  OPERATIVE IMPLANTS: Installation wound VAC.  @ENCIMAGES @  OPERATIVE FINDINGS:. Further necrotic muscle involving the gastrocnemius and soleus muscle   OPERATIVE PROCEDURE: Patient was brought the operating room and underwent a general anesthetic.  After adequate levels of anesthesia were obtained patient's left lower extremity was prepped using Betadine paint and draped into a sterile field a timeout was called.  The sutures were removed the cleanse choice wound VAC sponge was removed.  There was more necrotic muscle involving the gastrocnemius muscle as well as the soleus muscle.  There was necrotic fascia over the anterior compartment.  Using a 21 blade knife and a rondure further excision of the gastrocnemius muscle soleus muscle and fascia of the anterior compartment was excised.  The wound was irrigated with pulsatile lavage hemostasis was obtained.  The proximal aspect of the incision was closed all of the wound bed was covered with the reticulated cleanse choice foam the proximal aspect was  sutured over this.  The cleanse choice foam was applied against the calcaneus.  This was then covered with the solid foam after the wound edges were reapproximated.  This was connected to suction this had a good suction fit set to continue irrigation of 16 cc.  Patient was extubated taken the PACU in stable condition.   DISCHARGE PLANNING:  Antibiotic duration: Continue IV vancomycin and Zosyn.  Weightbearing: Nonweightbearing on the left lower extremity wear the PRAFO 24 hours a day on the left.  Pain medication: Continue current medication.  Dressing care/ Wound VAC: Continue installation wound VAC this will be changed in the OR on Wednesday.  Ambulatory devices: As needed.  Discharge to: Discharge planning pending.  Follow-up: In the office 1 week post operative.

## 2018-01-12 ENCOUNTER — Encounter (HOSPITAL_COMMUNITY): Payer: Self-pay | Admitting: Orthopedic Surgery

## 2018-01-12 LAB — GLUCOSE, CAPILLARY
GLUCOSE-CAPILLARY: 106 mg/dL — AB (ref 65–99)
GLUCOSE-CAPILLARY: 121 mg/dL — AB (ref 65–99)
GLUCOSE-CAPILLARY: 132 mg/dL — AB (ref 65–99)
GLUCOSE-CAPILLARY: 95 mg/dL (ref 65–99)
Glucose-Capillary: 111 mg/dL — ABNORMAL HIGH (ref 65–99)
Glucose-Capillary: 91 mg/dL (ref 65–99)

## 2018-01-12 NOTE — Progress Notes (Signed)
Physical Therapy Treatment Patient Details Name: Aaron Mcconnell MRN: [id number] DOB: 03/06/1976 Today's Date: 01/12/2018    History of Present Illness Admitted with LLE extensive infection, necrotizing fasciitis; Now s/p decompression with extensive debridement and VAC placement;  has no past medical history on file.  Pt underwent repeat I&D 01-11-18.     PT Comments    Pt underwent repeat I&D LLE yesterday, 01-11-18. He reports he has been performing UE/LE exercise program in bed 2-3x day. He is agreeable to transfer into recliner. Pt positioned with feet elevated at end of session.    Follow Up Recommendations  Home health PT;Supervision - Intermittent     Equipment Recommendations  Rolling walker with 5" wheels;3in1 (PT);Wheelchair (measurements PT);Other (comment)    Recommendations for Other Services       Precautions / Restrictions Precautions Precautions: Fall Restrictions LLE Weight Bearing: Non weight bearing    Mobility  Bed Mobility         Supine to sit: Supervision Sit to supine: Supervision   General bed mobility comments: Supervision for safety and lines, +rail  Transfers   Equipment used: None Transfers: Squat Pivot Transfers     Squat pivot transfers: Min guard     General transfer comment: squat-pivot transfer toward right into drop-arm recliner. min guard for safety and lines  Ambulation/Gait                 Stairs            Wheelchair Mobility    Modified Rankin (Stroke Patients Only)       Balance                                            Cognition Arousal/Alertness: Awake/alert Behavior During Therapy: WFL for tasks assessed/performed Overall Cognitive Status: Within Functional Limits for tasks assessed                                        Exercises General Exercises - Lower Extremity Quad Sets: Both;10 reps;AROM Heel Slides: 10 reps;Left;AROM Hip ABduction/ADduction:  10 reps;AROM;Both Straight Leg Raises: AROM;Left;5 reps    General Comments        Pertinent Vitals/Pain Pain Assessment: 0-10 Pain Score: 5  Pain Location: LLE/calf Pain Descriptors / Indicators: Sore Pain Intervention(s): Premedicated before session;Repositioned    Home Living                      Prior Function            PT Goals (current goals can now be found in the care plan section) Acute Rehab PT Goals Patient Stated Goal: hopes to be able to use crutches; wants to get back to work PT Goal Formulation: With patient Time For Goal Achievement: 01/20/18 Potential to Achieve Goals: Good Progress towards PT goals: Progressing toward goals    Frequency    Min 5X/week      PT Plan Current plan remains appropriate    Co-evaluation              AM-PAC PT "6 Clicks" Daily Activity  Outcome Measure  Difficulty turning over in bed (including adjusting bedclothes, sheets and blankets)?: None Difficulty moving from lying on back to sitting on the side of the bed? :  None Difficulty sitting down on and standing up from a chair with arms (e.g., wheelchair, bedside commode, etc,.)?: A Little Help needed moving to and from a bed to chair (including a wheelchair)?: A Little Help needed walking in hospital room?: A Lot Help needed climbing 3-5 steps with a railing? : Total 6 Click Score: 17    End of Session Equipment Utilized During Treatment: Gait belt Activity Tolerance: Patient tolerated treatment well Patient left: in chair;with call bell/phone within reach Nurse Communication: Mobility status PT Visit Diagnosis: Other abnormalities of gait and mobility (R26.89);Pain;Muscle weakness (generalized) (M62.81) Pain - Right/Left: Left Pain - part of body: Leg     Time: 1050-1107 PT Time Calculation (min) (ACUTE ONLY): 17 min  Charges:  $Therapeutic Activity: 8-22 mins                    G Codes:       Aida Raider, PT  Office # [phone] Pager  [phone]    Ilda Foil 01/12/2018, 11:52 AM

## 2018-01-12 NOTE — Progress Notes (Signed)
Subjective: 1 Day Post-Op Procedure(s) (LRB): REPEAT IRRIGATION AND DEBRIDEMENT LEFT LEG, APPLY WOUND VAC (Left) Patient reports pain as mild    Objective: Vital signs in last 24 hours: Temp:  [97.3 F (36.3 C)-98.4 F (36.9 C)] 98.4 F (36.9 C) (03/23 0729) Pulse Rate:  [51-76] 54 (03/23 0729) Resp:  [12-25] 18 (03/23 0729) BP: (102-147)/(59-98) 108/64 (03/23 0729) SpO2:  [95 %-99 %] 98 % (03/23 0729)  Intake/Output from previous day: 03/22 0701 - 03/23 0700 In: 2024 [P.O.:480; I.V.:1244; IV Piggyback:300] Out: 2750 [Urine:1900; Drains:750; Blood:100] Intake/Output this shift: No intake/output data recorded.  No results for input(s): HGB in the last 72 hours. No results for input(s): WBC, RBC, HCT, PLT in the last 72 hours. Recent Labs    01/10/18 0646 01/11/18 0630  NA 137 140  K 3.9 3.5  CL 102 102  CO2 27 28  BUN 8 8  CREATININE 0.69 0.87  GLUCOSE 123* 98  CALCIUM 8.1* 8.3*   No results for input(s): LABPT, INR in the last 72 hours.  dressings in place  Assessment/Plan: 1 Day Post-Op Procedure(s) (LRB): REPEAT IRRIGATION AND DEBRIDEMENT LEFT LEG, APPLY WOUND VAC (Left) Plan repeat surgery on Wednesday  Jacqualine CodeBrian Tkeya Stencil 01/12/2018, 7:57 AM

## 2018-01-12 NOTE — Plan of Care (Signed)
  Problem: Safety: Goal: Ability to remain free from injury will improve Outcome: Progressing   Problem: Pain Managment: Goal: General experience of comfort will improve Outcome: Progressing   

## 2018-01-13 LAB — GLUCOSE, CAPILLARY
GLUCOSE-CAPILLARY: 106 mg/dL — AB (ref 65–99)
Glucose-Capillary: 94 mg/dL (ref 65–99)
Glucose-Capillary: 104 mg/dL — ABNORMAL HIGH (ref 65–99)

## 2018-01-13 NOTE — Progress Notes (Signed)
Subjective: 2 Days Post-Op Procedure(s) (LRB): REPEAT IRRIGATION AND DEBRIDEMENT LEFT LEG, APPLY WOUND VAC (Left) Patient reports pain as mild.    Objective: Vital signs in last 24 hours: Temp:  [98.4 F (36.9 C)-98.7 F (37.1 C)] 98.4 F (36.9 C) (03/24 0609) Pulse Rate:  [52-61] 52 (03/24 0609) Resp:  [18-20] 20 (03/24 0609) BP: (108-127)/(64-76) 114/67 (03/24 0609) SpO2:  [96 %-98 %] 97 % (03/24 0609)  Intake/Output from previous day: 03/23 0701 - 03/24 0700 In: 949.8 [P.O.:600; I.V.:199.8; IV Piggyback:150] Out: 2884 [Urine:2884] Intake/Output this shift: No intake/output data recorded.  No results for input(s): HGB in the last 72 hours. No results for input(s): WBC, RBC, HCT, PLT in the last 72 hours. Recent Labs    01/11/18 0630  NA 140  K 3.5  CL 102  CO2 28  BUN 8  CREATININE 0.87  GLUCOSE 98  CALCIUM 8.3*   No results for input(s): LABPT, INR in the last 72 hours.  ABD soft No cellulitis present Compartment soft No increased pain-dressings to both feet intact Assessment/Plan:  2 Days Post-Op Procedure(s) (LRB): REPEAT IRRIGATION AND DEBRIDEMENT LEFT LEG, APPLY WOUND VAC (Left)possible return to OR Wed Aaron Mcconnell 01/13/2018, 7:06 AM

## 2018-01-14 DIAGNOSIS — G629 Polyneuropathy, unspecified: Secondary | ICD-10-CM

## 2018-01-14 DIAGNOSIS — F1010 Alcohol abuse, uncomplicated: Secondary | ICD-10-CM

## 2018-01-14 LAB — GLUCOSE, CAPILLARY
GLUCOSE-CAPILLARY: 107 mg/dL — AB (ref 65–99)
Glucose-Capillary: 102 mg/dL — ABNORMAL HIGH (ref 65–99)
Glucose-Capillary: 104 mg/dL — ABNORMAL HIGH (ref 65–99)
Glucose-Capillary: 109 mg/dL — ABNORMAL HIGH (ref 65–99)
Glucose-Capillary: 95 mg/dL (ref 65–99)

## 2018-01-14 NOTE — H&P (View-Only) (Signed)
Patient ID: Aaron Mcconnell, male   DOB: 03/16/1976, 42 y.o.   MRN: [id number] Patient has a full canister from the installation wound VAC this appears to be functioning well.  We will plan for return to the operating room on Wednesday.  If we are not showing any signs of good granulation tissue will need to discuss long-term treatment options.

## 2018-01-14 NOTE — Plan of Care (Signed)
  Problem: Education: Goal: Knowledge of General Education information will improve Outcome: Progressing   Problem: Health Behavior/Discharge Planning: Goal: Ability to manage health-related needs will improve Outcome: Progressing   Problem: Clinical Measurements: Goal: Ability to maintain clinical measurements within normal limits will improve Outcome: Progressing Goal: Will remain free from infection Outcome: Progressing Goal: Diagnostic test results will improve Outcome: Progressing Goal: Respiratory complications will improve Outcome: Progressing Goal: Cardiovascular complication will be avoided Outcome: Progressing   Problem: Activity: Goal: Risk for activity intolerance will decrease Outcome: Progressing   Problem: Nutrition: Goal: Adequate nutrition will be maintained Outcome: Progressing   Problem: Coping: Goal: Level of anxiety will decrease Outcome: Progressing   Problem: Pain Managment: Goal: General experience of comfort will improve Outcome: Progressing   Problem: Safety: Goal: Ability to remain free from injury will improve Outcome: Progressing   Problem: Skin Integrity: Goal: Risk for impaired skin integrity will decrease Outcome: Progressing   

## 2018-01-14 NOTE — Progress Notes (Signed)
Occupational Therapy Treatment Patient Details Name: Aaron Mcconnell MRN: [id number] DOB: 03/25/1976 Today's Date: 01/14/2018    History of present illness Admitted with LLE extensive infection, necrotizing fasciitis; Now s/p decompression with extensive debridement and VAC placement;  has no past medical history on file.  Pt underwent repeat I&D 01-11-18.    OT comments  Pt progressing towards OT goals, presents sitting up in recliner agreeable to OT tx session. Pt engaging in seated bil UE exercises using 5lb weight for increased UB strengthening/endurance in preparation for ADL/functional task completion after return home. Setup assist provided for seated bathing ADLs. Pt reports plans for additional procedure this Wednesday (3/27). Will continue to follow acutely to progress pt's overall strength, safety and independence with ADLs and functional mobility prior to discharge home.    Follow Up Recommendations  Home health OT;Supervision/Assistance - 24 hour(24hr initially )    Equipment Recommendations  3 in 1 bedside commode          Precautions / Restrictions Precautions Precautions: Fall Restrictions Weight Bearing Restrictions: Yes LLE Weight Bearing: Non weight bearing       Mobility Bed Mobility               General bed mobility comments: OOB in recliner upon arrival                                                                   ADL either performed or assessed with clinical judgement   ADL Overall ADL's : Needs assistance/impaired                                       General ADL Comments: pt completing bil UE exercises using 5lb weight while seated; setup assist provided for pt to complete bathing ADLs; pt prefers not to complete extensive mobility at this time until after upcoming procedure this Wednesday (wants to protect his heel as much as possible until this time)                        Cognition Arousal/Alertness: Awake/alert Behavior During Therapy: WFL for tasks assessed/performed Overall Cognitive Status: Within Functional Limits for tasks assessed                                          Exercises General Exercises - Upper Extremity Elbow Flexion: AROM;15 reps;Both;Seated;Bar weights/barbell Bar Weights/Barbell (Elbow Flexion): 5 lbs Elbow Extension: AROM;15 reps;Both;Seated;Bar weights/barbell Bar Weights/Barbell (Elbow Extension): 5 lbs Other Exercises Other Exercises: use of 5lb dumbbell, alternating between L/R UE with exercises including chest press foward and upward, tricep curls; 10x each exercise                 Pertinent Vitals/ Pain       Pain Assessment: Faces Faces Pain Scale: Hurts a little bit Pain Location: LLE/calf Pain Descriptors / Indicators: Sore Pain Intervention(s): Monitored during session  Frequency  Min 2X/week        Progress Toward Goals  OT Goals(current goals can now be found in the care plan section)  Progress towards OT goals: Progressing toward goals  Acute Rehab OT Goals Patient Stated Goal: wants to get home and back to work OT Goal Formulation: With patient Time For Goal Achievement: 01/24/18 Potential to Achieve Goals: Good  Plan Discharge plan remains appropriate                    AM-PAC PT "6 Clicks" Daily Activity     Outcome Measure   Help from another person eating meals?: None Help from another person taking care of personal grooming?: None Help from another person toileting, which includes using toliet, bedpan, or urinal?: A Little Help from another person bathing (including washing, rinsing, drying)?: A Lot Help from another person to put on and taking off regular upper body clothing?: None Help from another person to put on and taking off regular lower body clothing?: A Lot 6 Click  Score: 19    End of Session Equipment Utilized During Treatment: Other (comment)(5lb dumbbell)  OT Visit Diagnosis: Other abnormalities of gait and mobility (R26.89)   Activity Tolerance Patient tolerated treatment well   Patient Left in chair;with call bell/phone within reach   Nurse Communication Mobility status        Time: 4098-1191 OT Time Calculation (min): 17 min  Charges: OT General Charges $OT Visit: 1 Visit OT Treatments $Therapeutic Activity: 8-22 mins  Marcy Siren, OT Pager [phone] 01/14/2018    Aaron Mcconnell 01/14/2018, 3:57 PM

## 2018-01-14 NOTE — Progress Notes (Signed)
ANTIBIOTIC CONSULT NOTE   Pharmacy Consult for Zosyn Indication: wound infection  Allergies  Allergen Reactions  . Erythromycin Itching  . Oxycodone Itching    Patient Measurements: Height: 6\' 5"  (195.6 cm) Weight: 221 lb 9 oz (100.5 kg)(RN reported) IBW/kg (Calculated) : 89.1 Adjusted Body Weight:   Vital Signs: Temp: 98.7 F (37.1 C) (03/25 0552) Temp Source: Oral (03/25 0552) BP: 116/73 (03/25 0552) Pulse Rate: 65 (03/25 0552) Intake/Output from previous day: 03/24 0701 - 03/25 0700 In: 1350 [P.O.:1300; IV Piggyback:50] Out: 3601 [Urine:3600; Stool:1] Intake/Output from this shift: Total I/O In: -  Out: 1 [Stool:1]  Labs: No results for input(s): WBC, HGB, PLT, LABCREA, CREATININE in the last 72 hours. Estimated Creatinine Clearance: 142.2 mL/min (by C-G formula based on SCr of 0.87 mg/dL). No results for input(s): VANCOTROUGH, VANCOPEAK, VANCORANDOM, GENTTROUGH, GENTPEAK, GENTRANDOM, TOBRATROUGH, TOBRAPEAK, TOBRARND, AMIKACINPEAK, AMIKACINTROU, AMIKACIN in the last 72 hours.   Microbiology:   Medical History: History reviewed. No pertinent past medical history.   Assessment: ID: [id number] for wound infxn, left leg nec fasc, s/p urgent I&D 3/16, repeat I&D 3/19, 3/22, 3/27 Afebrile, WBC WNL  Vanc 3/16 >> 3/22 Zosyn 3/16 >>  3/16 L-Leg wound cx - mixed flora, rare Alcaligenes faecalis (GNR - sensitive to Zosyn per micro) 3/18 surgical screen PCR - positive Staph aureus   Goal of Therapy:  Eradication of infection  Plan:  Zosyn 3.375g IV q 8hrs. Dose ok down to a CrCl of 20. Pharmacy will sign off. Please reconsult for further dosing assitance.   Brightyn Mozer S. Merilynn Finlandobertson, PharmD, BCPS Clinical Staff Pharmacist Pager [phone]  Misty Stanleyobertson, Deroy Noah Stillinger 01/14/2018,9:13 AM

## 2018-01-14 NOTE — Progress Notes (Signed)
Patient ID: Aaron Mcconnell, male   DOB: 07/16/1976, 42 y.o.   MRN: [id number] Patient has a full canister from the installation wound VAC this appears to be functioning well.  We will plan for return to the operating room on Wednesday.  If we are not showing any signs of good granulation tissue will need to discuss long-term treatment options. 

## 2018-01-14 NOTE — Progress Notes (Signed)
Physical Therapy Treatment Patient Details Name: Aaron Mcconnell MRN: [id number] DOB: 1976-03-24 Today's Date: 01/14/2018    History of Present Illness Admitted with LLE extensive infection, necrotizing fasciitis; Now s/p decompression with extensive debridement and VAC placement;  has no past medical history on file.  Pt underwent repeat I&D 01-11-18.     PT Comments    Continuing work on functional mobility and activity tolerance;  Noting improvements; able to support full body wiehgt on RW, and pick up stance leg while keeping NWB; added wheelchair goal as well   Follow Up Recommendations  Home health PT;Supervision - Intermittent     Equipment Recommendations  Rolling walker with 5" wheels;3in1 (PT);Wheelchair (measurements PT);Other (comment)    Recommendations for Other Services       Precautions / Restrictions Precautions Precautions: Fall Restrictions Weight Bearing Restrictions: Yes LLE Weight Bearing: Non weight bearing Other Position/Activity Restrictions: Difficulty maintianing NWB with RW, but improving    Mobility  Bed Mobility Overal bed mobility: Needs Assistance Bed Mobility: Supine to Sit;Sit to Supine     Supine to sit: Supervision Sit to supine: Supervision   General bed mobility comments: OOB in recliner upon arrival  Transfers Overall transfer level: Needs assistance Equipment used: Rolling walker (2 wheeled) Transfers: Sit to/from Stand Sit to Stand: Min assist         General transfer comment: Cues for technique and hand placement  Ambulation/Gait                 Stairs            Wheelchair Mobility    Modified Rankin (Stroke Patients Only)       Balance                                            Cognition Arousal/Alertness: Awake/alert Behavior During Therapy: WFL for tasks assessed/performed Overall Cognitive Status: Within Functional Limits for tasks assessed                                         Exercises General Exercises - Upper Extremity Elbow Flexion: AROM;15 reps;Both;Seated;Bar weights/barbell Bar Weights/Barbell (Elbow Flexion): 5 lbs Elbow Extension: AROM;15 reps;Both;Seated;Bar weights/barbell Bar Weights/Barbell (Elbow Extension): 5 lbs Other Exercises Other Exercises: Performed modified dips within RW with R foot on the floor as needed; good maintenance of L NWB    General Comments        Pertinent Vitals/Pain Pain Assessment: 0-10 Pain Score: 7  Faces Pain Scale: Hurts a little bit Pain Location: LLE/calf Pain Descriptors / Indicators: Sore Pain Intervention(s): Monitored during session;Patient requesting pain meds-RN notified    Home Living                      Prior Function            PT Goals (current goals can now be found in the care plan section) Acute Rehab PT Goals Patient Stated Goal: wants to get home and back to work PT Goal Formulation: With patient Time For Goal Achievement: 01/20/18 Potential to Achieve Goals: Good Additional Goals Additional Goal #1: Pt will propel and manage wheelchair parts with min demo cues, propelling 100 feet including turns Progress towards PT goals: Progressing toward goals(added wheelchair goal)  Frequency    Min 5X/week      PT Plan Current plan remains appropriate    Co-evaluation              AM-PAC PT "6 Clicks" Daily Activity  Outcome Measure  Difficulty turning over in bed (including adjusting bedclothes, sheets and blankets)?: None Difficulty moving from lying on back to sitting on the side of the bed? : None Difficulty sitting down on and standing up from a chair with arms (e.g., wheelchair, bedside commode, etc,.)?: A Little Help needed moving to and from a bed to chair (including a wheelchair)?: A Little Help needed walking in hospital room?: A Lot Help needed climbing 3-5 steps with a railing? : Total 6 Click Score: 17    End of Session    Activity Tolerance: Patient tolerated treatment well Patient left: in bed;with call bell/phone within reach Nurse Communication: Mobility status PT Visit Diagnosis: Other abnormalities of gait and mobility (R26.89);Pain;Muscle weakness (generalized) (M62.81) Pain - Right/Left: Left Pain - part of body: Leg     Time: 0865-78461512-1535 PT Time Calculation (min) (ACUTE ONLY): 23 min  Charges:  $Therapeutic Exercise: 8-22 mins $Therapeutic Activity: 8-22 mins                    G Codes:       Van ClinesHolly Mercie Balsley, PT  Acute Rehabilitation Services Pager [phone] Office [phone]    Levi AlandHolly H Nadine Ryle 01/14/2018, 4:51 PM

## 2018-01-15 ENCOUNTER — Other Ambulatory Visit (INDEPENDENT_AMBULATORY_CARE_PROVIDER_SITE_OTHER): Payer: Self-pay | Admitting: Orthopedic Surgery

## 2018-01-15 DIAGNOSIS — M726 Necrotizing fasciitis: Secondary | ICD-10-CM

## 2018-01-15 LAB — GLUCOSE, CAPILLARY
Glucose-Capillary: 100 mg/dL — ABNORMAL HIGH (ref 65–99)
Glucose-Capillary: 108 mg/dL — ABNORMAL HIGH (ref 65–99)
Glucose-Capillary: 118 mg/dL — ABNORMAL HIGH (ref 65–99)
Glucose-Capillary: 121 mg/dL — ABNORMAL HIGH (ref 65–99)
Glucose-Capillary: 83 mg/dL (ref 65–99)

## 2018-01-15 LAB — OPIATES,MS,WB/SP RFX
6-ACETYLMORPHINE: NEGATIVE
CODEINE: NEGATIVE ng/mL
Dihydrocodeine: NEGATIVE ng/mL
HYDROCODONE: 1.5 ng/mL
HYDROMORPHONE: NEGATIVE ng/mL
MORPHINE: 3.3 ng/mL
Opiate Confirmation: POSITIVE

## 2018-01-15 LAB — DRUG SCREEN 10 W/CONF, SERUM
Amphetamines, IA: NEGATIVE ng/mL
BARBITURATES, IA: NEGATIVE ug/mL
BENZODIAZEPINES, IA: NEGATIVE ng/mL
Cocaine & Metabolite, IA: NEGATIVE ng/mL
METHADONE, IA: NEGATIVE ng/mL
Opiates, IA: POSITIVE ng/mL
Oxycodones, IA: NEGATIVE ng/mL
PROPOXYPHENE, IA: NEGATIVE ng/mL
Phencyclidine, IA: NEGATIVE ng/mL
THC(Marijuana) Metabolite, IA: NEGATIVE ng/mL

## 2018-01-15 LAB — OXYCODONES,MS,WB/SP RFX
Oxycocone: NEGATIVE ng/mL
Oxycodones Confirmation: NEGATIVE
Oxymorphone: NEGATIVE ng/mL

## 2018-01-15 MED ORDER — CHLORHEXIDINE GLUCONATE 4 % EX LIQD
60.0000 mL | Freq: Once | CUTANEOUS | Status: AC
  Administered 2018-01-15: 4 via TOPICAL
  Filled 2018-01-15: qty 15

## 2018-01-15 NOTE — Consult Note (Signed)
WOC Nurse wound follow up Wound type: Incision to left lower leg, VAC NPWT leak The patient stated that the leak was "patched" last night.  Currently the NPWT does not indicate any type of leak and the dressing is intact.  No interventions needed at this time. Thank you for the consult.  Discussed plan of care with the patient and bedside nurse.  WOC nurse will not follow at this time.  Please re-consult the WOC team if needed.  Helmut MusterSherry Naesha Buckalew, RN, MSN, CWOCN, CNS-BC, pager [phone]

## 2018-01-15 NOTE — Plan of Care (Signed)
  Problem: Education: Goal: Knowledge of General Education information will improve Outcome: Progressing   Problem: Health Behavior/Discharge Planning: Goal: Ability to manage health-related needs will improve Outcome: Progressing   Problem: Clinical Measurements: Goal: Ability to maintain clinical measurements within normal limits will improve Outcome: Progressing Goal: Will remain free from infection Outcome: Progressing Goal: Diagnostic test results will improve Outcome: Progressing Goal: Respiratory complications will improve Outcome: Progressing Goal: Cardiovascular complication will be avoided Outcome: Progressing   Problem: Activity: Goal: Risk for activity intolerance will decrease Outcome: Progressing   Problem: Coping: Goal: Level of anxiety will decrease Outcome: Progressing   Problem: Pain Managment: Goal: General experience of comfort will improve Outcome: Progressing   Problem: Safety: Goal: Ability to remain free from injury will improve Outcome: Progressing   Problem: Skin Integrity: Goal: Risk for impaired skin integrity will decrease Outcome: Progressing   

## 2018-01-16 ENCOUNTER — Inpatient Hospital Stay (HOSPITAL_COMMUNITY): Payer: PRIVATE HEALTH INSURANCE | Admitting: Certified Registered"

## 2018-01-16 ENCOUNTER — Encounter (HOSPITAL_COMMUNITY): Payer: Self-pay | Admitting: *Deleted

## 2018-01-16 ENCOUNTER — Encounter (HOSPITAL_COMMUNITY)
Admission: AD | Disposition: A | Discharge status: Home Health | Payer: Self-pay | Source: Other Acute Inpatient Hospital | Attending: Orthopedic Surgery

## 2018-01-16 DIAGNOSIS — M726 Necrotizing fasciitis: Secondary | ICD-10-CM

## 2018-01-16 DIAGNOSIS — M86179 Other acute osteomyelitis, unspecified ankle and foot: Secondary | ICD-10-CM

## 2018-01-16 DIAGNOSIS — A419 Sepsis, unspecified organism: Principal | ICD-10-CM

## 2018-01-16 HISTORY — PX: I & D EXTREMITY: SHX5045

## 2018-01-16 LAB — GLUCOSE, CAPILLARY
GLUCOSE-CAPILLARY: 93 mg/dL (ref 65–99)
GLUCOSE-CAPILLARY: 76 mg/dL (ref 65–99)
GLUCOSE-CAPILLARY: 85 mg/dL (ref 65–99)
GLUCOSE-CAPILLARY: 191 mg/dL — AB (ref 65–99)
Glucose-Capillary: 158 mg/dL — ABNORMAL HIGH (ref 65–99)

## 2018-01-16 SURGERY — IRRIGATION AND DEBRIDEMENT EXTREMITY
Anesthesia: General | Laterality: Left

## 2018-01-16 MED ORDER — FENTANYL CITRATE (PF) 100 MCG/2ML IJ SOLN
INTRAMUSCULAR | Status: DC | PRN
  Administered 2018-01-16 (×2): 50 ug via INTRAVENOUS
  Administered 2018-01-16: 100 ug via INTRAVENOUS
  Administered 2018-01-16: 150 ug via INTRAVENOUS
  Administered 2018-01-16 (×3): 50 ug via INTRAVENOUS

## 2018-01-16 MED ORDER — HYDROCODONE-ACETAMINOPHEN 5-325 MG PO TABS
1.0000 | ORAL_TABLET | Freq: Four times a day (QID) | ORAL | Status: DC | PRN
  Administered 2018-01-16 – 2018-01-18 (×6): 2 via ORAL
  Administered 2018-01-18: 1 via ORAL
  Administered 2018-01-19 – 2018-01-25 (×23): 2 via ORAL
  Filled 2018-01-16 (×29): qty 2

## 2018-01-16 MED ORDER — ACETAMINOPHEN 325 MG PO TABS: 325.0000 mg | ORAL_TABLET | Freq: Four times a day (QID) | ORAL | Status: DC | PRN

## 2018-01-16 MED ORDER — EPHEDRINE 5 MG/ML INJ
INTRAVENOUS | Status: AC
  Filled 2018-01-16: qty 10

## 2018-01-16 MED ORDER — PHENYLEPHRINE HCL 10 MG/ML IJ SOLN
INTRAMUSCULAR | Status: DC | PRN
  Administered 2018-01-16: 80 ug via INTRAVENOUS

## 2018-01-16 MED ORDER — METHOCARBAMOL 500 MG PO TABS
ORAL_TABLET | ORAL | Status: AC
  Filled 2018-01-16: qty 1

## 2018-01-16 MED ORDER — DEXAMETHASONE SODIUM PHOSPHATE 4 MG/ML IJ SOLN
INTRAMUSCULAR | Status: DC | PRN
  Administered 2018-01-16: 8 mg via INTRAVENOUS

## 2018-01-16 MED ORDER — LACTATED RINGERS IV SOLN
INTRAVENOUS | Status: DC | PRN
  Administered 2018-01-16: 11:00:00 via INTRAVENOUS

## 2018-01-16 MED ORDER — GLYCOPYRROLATE 0.2 MG/ML IV SOSY
PREFILLED_SYRINGE | INTRAVENOUS | Status: DC | PRN
  Administered 2018-01-16: .2 mg via INTRAVENOUS

## 2018-01-16 MED ORDER — 0.9 % SODIUM CHLORIDE (POUR BTL) OPTIME
TOPICAL | Status: DC | PRN
  Administered 2018-01-16: 1000 mL

## 2018-01-16 MED ORDER — PROMETHAZINE HCL 25 MG/ML IJ SOLN: 6.2500 mg | INTRAMUSCULAR | Status: DC | PRN

## 2018-01-16 MED ORDER — DEXAMETHASONE SODIUM PHOSPHATE 10 MG/ML IJ SOLN
INTRAMUSCULAR | Status: AC
  Filled 2018-01-16: qty 1

## 2018-01-16 MED ORDER — KETOROLAC TROMETHAMINE 30 MG/ML IJ SOLN
30.0000 mg | Freq: Once | INTRAMUSCULAR | Status: AC | PRN
  Administered 2018-01-16: 30 mg via INTRAVENOUS

## 2018-01-16 MED ORDER — KETOROLAC TROMETHAMINE 30 MG/ML IJ SOLN
INTRAMUSCULAR | Status: AC
  Filled 2018-01-16: qty 1

## 2018-01-16 MED ORDER — FENTANYL CITRATE (PF) 250 MCG/5ML IJ SOLN
INTRAMUSCULAR | Status: AC
  Filled 2018-01-16: qty 5

## 2018-01-16 MED ORDER — LIDOCAINE HCL (CARDIAC) 20 MG/ML IV SOLN
INTRAVENOUS | Status: AC
  Filled 2018-01-16: qty 5

## 2018-01-16 MED ORDER — HYDROCODONE-ACETAMINOPHEN 5-325 MG PO TABS
ORAL_TABLET | ORAL | Status: AC
  Filled 2018-01-16: qty 2

## 2018-01-16 MED ORDER — ROCURONIUM BROMIDE 10 MG/ML (PF) SYRINGE
PREFILLED_SYRINGE | INTRAVENOUS | Status: AC
  Filled 2018-01-16: qty 5

## 2018-01-16 MED ORDER — MIDAZOLAM HCL 2 MG/2ML IJ SOLN
INTRAMUSCULAR | Status: AC
  Filled 2018-01-16: qty 2

## 2018-01-16 MED ORDER — POLYETHYLENE GLYCOL 3350 17 G PO PACK: 17.0000 g | PACK | Freq: Every day | ORAL | Status: DC | PRN

## 2018-01-16 MED ORDER — CEFAZOLIN SODIUM-DEXTROSE 2-4 GM/100ML-% IV SOLN
INTRAVENOUS | Status: AC
  Filled 2018-01-16: qty 100

## 2018-01-16 MED ORDER — CEFAZOLIN SODIUM-DEXTROSE 2-4 GM/100ML-% IV SOLN
2.0000 g | INTRAVENOUS | Status: AC
  Administered 2018-01-16: 2 g via INTRAVENOUS

## 2018-01-16 MED ORDER — LACTATED RINGERS IV SOLN
INTRAVENOUS | Status: DC
  Administered 2018-01-16: 10:00:00 via INTRAVENOUS

## 2018-01-16 MED ORDER — ONDANSETRON HCL 4 MG PO TABS
4.0000 mg | ORAL_TABLET | Freq: Four times a day (QID) | ORAL | Status: DC | PRN
Start: 2018-01-16 — End: 2018-01-25
  Administered 2018-01-23: 4 mg via ORAL
  Filled 2018-01-16: qty 1

## 2018-01-16 MED ORDER — ONDANSETRON HCL 4 MG/2ML IJ SOLN: 4.0000 mg | Freq: Four times a day (QID) | INTRAMUSCULAR | Status: DC | PRN

## 2018-01-16 MED ORDER — SODIUM CHLORIDE 0.9 % IV SOLN: INTRAVENOUS | Status: DC

## 2018-01-16 MED ORDER — MEPERIDINE HCL 50 MG/ML IJ SOLN: 6.2500 mg | INTRAMUSCULAR | Status: DC | PRN

## 2018-01-16 MED ORDER — ONDANSETRON HCL 4 MG/2ML IJ SOLN
INTRAMUSCULAR | Status: DC | PRN
  Administered 2018-01-16: 4 mg via INTRAVENOUS

## 2018-01-16 MED ORDER — BISACODYL 10 MG RE SUPP: 10.0000 mg | Freq: Every day | RECTAL | Status: DC | PRN

## 2018-01-16 MED ORDER — METOCLOPRAMIDE HCL 5 MG/ML IJ SOLN: 5.0000 mg | Freq: Three times a day (TID) | INTRAMUSCULAR | Status: DC | PRN

## 2018-01-16 MED ORDER — DOCUSATE SODIUM 100 MG PO CAPS: 100.0000 mg | ORAL_CAPSULE | Freq: Two times a day (BID) | ORAL | Status: DC

## 2018-01-16 MED ORDER — METOCLOPRAMIDE HCL 5 MG PO TABS
5.0000 mg | ORAL_TABLET | Freq: Three times a day (TID) | ORAL | Status: DC | PRN
Start: 2018-01-16 — End: 2018-01-25

## 2018-01-16 MED ORDER — MAGNESIUM CITRATE PO SOLN: 1.0000 | Freq: Once | ORAL | Status: DC | PRN

## 2018-01-16 MED ORDER — PHENYLEPHRINE 40 MCG/ML (10ML) SYRINGE FOR IV PUSH (FOR BLOOD PRESSURE SUPPORT)
PREFILLED_SYRINGE | INTRAVENOUS | Status: AC
  Filled 2018-01-16: qty 10

## 2018-01-16 MED ORDER — LIDOCAINE 2% (20 MG/ML) 5 ML SYRINGE
INTRAMUSCULAR | Status: DC | PRN
  Administered 2018-01-16: 100 mg via INTRAVENOUS

## 2018-01-16 MED ORDER — ONDANSETRON HCL 4 MG/2ML IJ SOLN
INTRAMUSCULAR | Status: AC
  Filled 2018-01-16: qty 2

## 2018-01-16 MED ORDER — PROPOFOL 10 MG/ML IV BOLUS
INTRAVENOUS | Status: AC
  Filled 2018-01-16: qty 20

## 2018-01-16 MED ORDER — METHOCARBAMOL 500 MG PO TABS
500.0000 mg | ORAL_TABLET | Freq: Four times a day (QID) | ORAL | Status: DC | PRN
  Administered 2018-01-16 – 2018-01-25 (×29): 500 mg via ORAL
  Filled 2018-01-16 (×28): qty 1

## 2018-01-16 MED ORDER — PROPOFOL 10 MG/ML IV BOLUS
INTRAVENOUS | Status: DC | PRN
  Administered 2018-01-16: 200 mg via INTRAVENOUS

## 2018-01-16 MED ORDER — HYDROMORPHONE HCL 1 MG/ML IJ SOLN
0.5000 mg | INTRAMUSCULAR | Status: DC | PRN
  Administered 2018-01-24 – 2018-01-25 (×3): 1 mg via INTRAVENOUS
  Filled 2018-01-16 (×4): qty 1

## 2018-01-16 MED ORDER — METHOCARBAMOL 1000 MG/10ML IJ SOLN
500.0000 mg | Freq: Four times a day (QID) | INTRAVENOUS | Status: DC | PRN
  Filled 2018-01-16: qty 5

## 2018-01-16 MED ORDER — FENTANYL CITRATE (PF) 100 MCG/2ML IJ SOLN: 25.0000 ug | INTRAMUSCULAR | Status: DC | PRN

## 2018-01-16 MED ORDER — MIDAZOLAM HCL 5 MG/5ML IJ SOLN
INTRAMUSCULAR | Status: DC | PRN
  Administered 2018-01-16: 2 mg via INTRAVENOUS

## 2018-01-16 SURGICAL SUPPLY — 35 items
BLADE SURG 21 STRL SS (BLADE) ×3 IMPLANT
BNDG COHESIVE 6X5 TAN STRL LF (GAUZE/BANDAGES/DRESSINGS) IMPLANT
BNDG GAUZE ELAST 4 BULKY (GAUZE/BANDAGES/DRESSINGS) ×2 IMPLANT
CANISTER SUCTION 1500CC (MISCELLANEOUS) ×2 IMPLANT
CASSETTE VERAFLO VERALINK (MISCELLANEOUS) ×2 IMPLANT
COVER SURGICAL LIGHT HANDLE (MISCELLANEOUS) ×6 IMPLANT
DRAPE U-SHAPE 47X51 STRL (DRAPES) ×3 IMPLANT
DRESSING VERAFLO CLEANSE CC (GAUZE/BANDAGES/DRESSINGS) IMPLANT
DRSG ADAPTIC 3X8 NADH LF (GAUZE/BANDAGES/DRESSINGS) ×3 IMPLANT
DRSG VERAFLO CLEANSE CC (GAUZE/BANDAGES/DRESSINGS) ×3
DURAPREP 26ML APPLICATOR (WOUND CARE) ×1 IMPLANT
ELECT REM PT RETURN 9FT ADLT (ELECTROSURGICAL)
ELECTRODE REM PT RTRN 9FT ADLT (ELECTROSURGICAL) IMPLANT
FLUID NSS /IRRIG 3000 ML XXX (IV SOLUTION) ×2 IMPLANT
GAUZE SPONGE 4X4 12PLY STRL (GAUZE/BANDAGES/DRESSINGS) ×1 IMPLANT
GLOVE BIOGEL PI IND STRL 9 (GLOVE) ×1 IMPLANT
GLOVE BIOGEL PI INDICATOR 9 (GLOVE) ×2
GLOVE SURG ORTHO 9.0 STRL STRW (GLOVE) ×3 IMPLANT
GOWN STRL REUS W/ TWL XL LVL3 (GOWN DISPOSABLE) ×2 IMPLANT
GOWN STRL REUS W/TWL XL LVL3 (GOWN DISPOSABLE) ×4
HANDPIECE INTERPULSE COAX TIP (DISPOSABLE) ×2
KIT BASIN OR (CUSTOM PROCEDURE TRAY) ×3 IMPLANT
KIT TURNOVER KIT B (KITS) ×3 IMPLANT
MANIFOLD NEPTUNE II (INSTRUMENTS) ×3 IMPLANT
NS IRRIG 1000ML POUR BTL (IV SOLUTION) ×3 IMPLANT
PACK ORTHO EXTREMITY (CUSTOM PROCEDURE TRAY) ×3 IMPLANT
PAD ARMBOARD 7.5X6 YLW CONV (MISCELLANEOUS) ×6 IMPLANT
SET HNDPC FAN SPRY TIP SCT (DISPOSABLE) IMPLANT
STOCKINETTE IMPERVIOUS 9X36 MD (GAUZE/BANDAGES/DRESSINGS) IMPLANT
SWAB COLLECTION DEVICE MRSA (MISCELLANEOUS) ×1 IMPLANT
SWAB CULTURE ESWAB REG 1ML (MISCELLANEOUS) IMPLANT
TOWEL OR 17X26 10 PK STRL BLUE (TOWEL DISPOSABLE) ×3 IMPLANT
TUBE CONNECTING 12'X1/4 (SUCTIONS) ×1
TUBE CONNECTING 12X1/4 (SUCTIONS) ×2 IMPLANT
YANKAUER SUCT BULB TIP NO VENT (SUCTIONS) ×3 IMPLANT

## 2018-01-16 NOTE — Anesthesia Preprocedure Evaluation (Signed)
Anesthesia Evaluation  Patient identified by MRN, date of birth, ID band Patient awake  General Assessment Comment:Allergies: Erythromycin  Reviewed: Allergy & Precautions, NPO status , Patient's Chart, lab work & pertinent test results  Airway Mallampati: II  TM Distance: >3 FB Neck ROM: Full    Dental no notable dental hx. (+) Teeth Intact, Dental Advisory Given   Pulmonary neg pulmonary ROS,    Pulmonary exam normal breath sounds clear to auscultation       Cardiovascular Exercise Tolerance: Good negative cardio ROS Normal cardiovascular exam Rhythm:Regular Rate:Normal     Neuro/Psych    GI/Hepatic negative GI ROS, Neg liver ROS,   Endo/Other  negative endocrine ROS  Renal/GU negative Renal ROS  negative genitourinary   Musculoskeletal negative musculoskeletal ROS (+) Left leg infection    Abdominal Normal abdominal exam  (+)   Peds  Hematology  (+) Blood dyscrasia (Thrombocytopenia), anemia ,   Anesthesia Other Findings   Reproductive/Obstetrics                             Anesthesia Physical  Anesthesia Plan  ASA: I  Anesthesia Plan: General   Post-op Pain Management:    Induction: Intravenous  PONV Risk Score and Plan: 2 and Dexamethasone and Ondansetron  Airway Management Planned: LMA  Additional Equipment:   Intra-op Plan:   Post-operative Plan: Extubation in OR  Informed Consent: I have reviewed the patients History and Physical, chart, labs and discussed the procedure including the risks, benefits and alternatives for the proposed anesthesia with the patient or authorized representative who has indicated his/her understanding and acceptance.   Dental advisory given  Plan Discussed with: CRNA and Surgeon  Anesthesia Plan Comments:         Anesthesia Quick Evaluation

## 2018-01-16 NOTE — Anesthesia Procedure Notes (Signed)
Procedure Name: LMA Insertion Date/Time: 01/16/2018 11:28 AM Performed by: Julian Reil, CRNA Pre-anesthesia Checklist: Patient identified, Emergency Drugs available, Suction available, Patient being monitored and Timeout performed Patient Re-evaluated:Patient Re-evaluated prior to induction Oxygen Delivery Method: Circle system utilized Preoxygenation: Pre-oxygenation with 100% oxygen Induction Type: IV induction Ventilation: Mask ventilation without difficulty LMA: LMA inserted LMA Size: 5.0 Tube type: Oral Number of attempts: 1 Placement Confirmation: positive ETCO2 Tube secured with: Tape Dental Injury: Teeth and Oropharynx as per pre-operative assessment

## 2018-01-16 NOTE — Op Note (Signed)
01/16/2018  12:21 PM  PATIENT:  Harriet PhoJay Randall Liscano    PRE-OPERATIVE DIAGNOSIS:  Necrotizing Fascitis Left Leg  POST-OPERATIVE DIAGNOSIS:  Same  PROCEDURE:  REPEAT IRRIGATION AND DEBRIDEMENT LEFT LEG, APPLY WOUND VAC excision skin soft tissue fascia and muscle including the gastrocnemius medial and lateral heads as well as the soleus muscle.  Sharply excised with curette and knife blade and rongeur.  Partial excision of the calcaneus.  SURGEON:  Nadara MustardMarcus V Mousa Prout, MD  PHYSICIAN ASSISTANT:None ANESTHESIA:   General  PREOPERATIVE INDICATIONS:  Harriet PhoJay Randall Noviello is a  42 y.o. male with a diagnosis of Necrotizing Fascitis Left Leg who failed conservative measures and elected for surgical management.    The risks benefits and alternatives were discussed with the patient preoperatively including but not limited to the risks of infection, bleeding, nerve injury, cardiopulmonary complications, the need for revision surgery, among others, and the patient was willing to proceed.  OPERATIVE IMPLANTS: Cleanse choice sponge.  @ENCIMAGES @  OPERATIVE FINDINGS: Removed progress of abscess ischemic changes to the gastrocnemius muscle medial and lateral heads as well as the sole   OPERATIVE PROCEDURE: Patient was brought to the operating room and underwent a general anesthetic.  After adequate levels of anesthesia were obtained patient's left lower extremity was prepped using Betadine paint and draped into a sterile field a timeout was called.  The incision was opened from the calcaneus up to the fibular head.  There is more ischemic skin anteriorly and this was excised approximately a centimeter of skin.  The heel pad was intact but was macerated.  There was noted abutting distally partial calcaneal excision was further performed with a rondure.  There is more necrotic muscle in the medial and lateral gastrocnemius muscles and this was excised using a knife and rondure.  Patient had a necrotic tissue of the  soleus muscle this was also excised.  The superficial peroneal nerve was in the surgical field and this was protected.  Wound was irrigated with pulsatile lavage.  A cleans choice wound VAC reticulated foam was placed deep within the wound.  The proximal two thirds of the incision was closed over the  VAC distally the solid foam was applied on top of cleanse choice reticulated foam this was covered with Ioban and this had a good suction fit.  Patient was extubated taken the PACU in stable condition.   DISCHARGE PLANNING:  Antibiotic duration: Continue IV antibiotics.  Weightbearing: Nonweightbearing on the left.  Continue pain medication.  Pain medication: continue  Dressing care/ Wound VAC: Cleanse choice installation was changed over to strict suction.  Ambulatory devices: Walker.  Discharge to:  recovery room.  Follow-up: In the office 1 week post operative.

## 2018-01-16 NOTE — Care Management (Signed)
Case manager continuing to follow patient for discharge needs.

## 2018-01-16 NOTE — Transfer of Care (Signed)
Immediate Anesthesia Transfer of Care Note  Patient: Aaron Mcconnell  Procedure(s) Performed: REPEAT IRRIGATION AND DEBRIDEMENT LEFT LEG, APPLY WOUND VAC (Left )  Patient Location: PACU  Anesthesia Type:General  Level of Consciousness: awake, oriented and patient cooperative  Airway & Oxygen Therapy: Patient Spontanous Breathing and Patient connected to face mask oxygen  Post-op Assessment: Report given to RN and Post -op Vital signs reviewed and stable  Post vital signs: Reviewed and stable  Last Vitals:  Vitals Value Taken Time  BP 117/78 01/16/2018 12:29 PM  Temp 36.8 C 01/16/2018 12:29 PM  Pulse 89 01/16/2018 12:31 PM  Resp 13 01/16/2018 12:31 PM  SpO2 100 % 01/16/2018 12:31 PM  Vitals shown include unvalidated device data.  Last Pain:  Vitals:   01/16/18 1229  TempSrc:   PainSc: 8       Patients Stated Pain Goal: 3 (01/12/18 2037)  Complications: No apparent anesthesia complications

## 2018-01-16 NOTE — Progress Notes (Signed)
Pt ready to return to rm, waiting on transport help

## 2018-01-16 NOTE — Interval H&P Note (Signed)
History and Physical Interval Note:  01/16/2018 7:04 AM  Aaron Mcconnell  has presented today for surgery, with the diagnosis of Necrotizing Fascitis Left Leg  The various methods of treatment have been discussed with the patient and family. After consideration of risks, benefits and other options for treatment, the patient has consented to  Procedure(s): REPEAT IRRIGATION AND DEBRIDEMENT LEFT LEG, APPLY WOUND VAC (Left) as a surgical intervention .  The patient's history has been reviewed, patient examined, no change in status, stable for surgery.  I have reviewed the patient's chart and labs.  Questions were answered to the patient's satisfaction.     Nadara MustardMarcus V 

## 2018-01-16 NOTE — Anesthesia Postprocedure Evaluation (Signed)
Anesthesia Post Note  Patient: Aaron Mcconnell  Procedure(s) Performed: REPEAT IRRIGATION AND DEBRIDEMENT LEFT LEG, APPLY WOUND VAC (Left )     Patient location during evaluation: PACU Anesthesia Type: General Level of consciousness: awake Pain management: pain level controlled Vital Signs Assessment: post-procedure vital signs reviewed and stable Respiratory status: spontaneous breathing Cardiovascular status: stable Postop Assessment: no apparent nausea or vomiting Anesthetic complications: no    Last Vitals:  Vitals:   01/16/18 1255 01/16/18 1324  BP: (!) 135/99 (!) 144/97  Pulse: 95 92  Resp: (!) 21 16  Temp: 36.9 C 36.9 C  SpO2: 100% 96%    Last Pain:  Vitals:   01/16/18 1255  TempSrc:   PainSc: 7    Pain Goal: Patients Stated Pain Goal: 7 (01/16/18 1255)               Samra Pesch JR,JOHN Susann GivensFRANKLIN

## 2018-01-16 NOTE — Interval H&P Note (Signed)
History and Physical Interval Note:  01/16/2018 7:05 AM  Aaron Mcconnell  has presented today for surgery, with the diagnosis of Necrotizing Fascitis Left Leg  The various methods of treatment have been discussed with the patient and family. After consideration of risks, benefits and other options for treatment, the patient has consented to  Procedure(s): REPEAT IRRIGATION AND DEBRIDEMENT LEFT LEG, APPLY WOUND VAC (Left) as a surgical intervention .  The patient's history has been reviewed, patient examined, no change in status, stable for surgery.  I have reviewed the patient's chart and labs.  Questions were answered to the patient's satisfaction.     Nadara MustardMarcus V Duda

## 2018-01-17 ENCOUNTER — Other Ambulatory Visit (INDEPENDENT_AMBULATORY_CARE_PROVIDER_SITE_OTHER): Payer: Self-pay | Admitting: Orthopedic Surgery

## 2018-01-17 ENCOUNTER — Encounter (HOSPITAL_COMMUNITY): Payer: Self-pay | Admitting: Orthopedic Surgery

## 2018-01-17 ENCOUNTER — Telehealth (INDEPENDENT_AMBULATORY_CARE_PROVIDER_SITE_OTHER): Payer: Self-pay | Admitting: Orthopedic Surgery

## 2018-01-17 DIAGNOSIS — M726 Necrotizing fasciitis: Secondary | ICD-10-CM

## 2018-01-17 LAB — GLUCOSE, CAPILLARY
GLUCOSE-CAPILLARY: 90 mg/dL (ref 65–99)
GLUCOSE-CAPILLARY: 86 mg/dL (ref 65–99)
GLUCOSE-CAPILLARY: 105 mg/dL — AB (ref 65–99)

## 2018-01-17 MED ORDER — CEFAZOLIN SODIUM-DEXTROSE 2-4 GM/100ML-% IV SOLN
2.0000 g | INTRAVENOUS | Status: AC
  Administered 2018-01-18: 2 g via INTRAVENOUS
  Filled 2018-01-17 (×2): qty 100

## 2018-01-17 MED ORDER — CHLORHEXIDINE GLUCONATE 4 % EX LIQD: 60.0000 mL | Freq: Once | CUTANEOUS | Status: DC

## 2018-01-17 NOTE — H&P (View-Only) (Signed)
Patient ID: Aaron Mcconnell, male   DOB: 10/03/1976, 42 y.o.   MRN: [id number] Postoperative day 1 repeat irrigation and debridement for necrotizing fasciitis left leg.  Patient still had necrotic tissue in the proximal aspect of the gastrocnemius and soleus.  Patient underwent further muscle debridement.  Patient has shown no progression of any granulation tissue over the calcaneus or distally.  Patient does not have any limb salvage options at this time.  Will plan for return to the operating room tomorrow.  If there is no longer any dead tissue proximally we would proceed with a transtibial amputation.  If patient still has necrotic tissue we would have to repeat irrigation and debridement and then plan for a transtibial amputation next week.

## 2018-01-17 NOTE — Telephone Encounter (Signed)
Patient's mother called stating that she really wants to talk to Dr. Lajoyce Cornersuda about her son.  CB#[phone].  Thank you.

## 2018-01-17 NOTE — Progress Notes (Signed)
PT Cancellation Note  Patient Details Name: Aaron Mcconnell MRN: [id number] DOB: 07/16/1976   Cancelled Treatment:     Re visited patient this afternoon to encourage OOB mobility, pt refusing politely, citing "i'd just like to get through tomorrow"   Etta GrandchildSean Lorean Ekstrand, PT, DPT Acute Rehab Services Pager: [phone]    Etta GrandchildSean  Gerhard Rappaport 01/17/2018, 4:20 PM

## 2018-01-17 NOTE — Plan of Care (Signed)
  Problem: Education: Goal: Knowledge of General Education information will improve Outcome: Progressing Note:  POC and and pain management reviewed with pt.

## 2018-01-17 NOTE — Progress Notes (Signed)
PT Cancellation Note  Patient Details Name: Aaron Mcconnell Jarvie MRN: [id number] DOB: 04/22/1976   Cancelled Treatment:    Reason Eval/Treat Not Completed: Patient declined. Attempted to work with patient today, patient refusing citing he's working through the emotions of the scheduled amputation for tomorrow. Encouraged patient and who said he may be up for working later this afternoon once his parents have visited. Will re-attempt later today.  Etta GrandchildSean Ashtyn Freilich, PT, DPT Acute Rehab Services Pager: [phone]     Etta GrandchildSean  Devantae Babe 01/17/2018, 12:04 PM

## 2018-01-17 NOTE — Progress Notes (Signed)
Patient ID: Aaron Mcconnell, male   DOB: 04/18/1976, 42 y.o.   MRN: [id number] Postoperative day 1 repeat irrigation and debridement for necrotizing fasciitis left leg.  Patient still had necrotic tissue in the proximal aspect of the gastrocnemius and soleus.  Patient underwent further muscle debridement.  Patient has shown no progression of any granulation tissue over the calcaneus or distally.  Patient does not have any limb salvage options at this time.  Will plan for return to the operating room tomorrow.  If there is no longer any dead tissue proximally we would proceed with a transtibial amputation.  If patient still has necrotic tissue we would have to repeat irrigation and debridement and then plan for a transtibial amputation next week. 

## 2018-01-17 NOTE — Telephone Encounter (Signed)
Dr. Lajoyce Cornersuda spoke with pt's mother

## 2018-01-18 ENCOUNTER — Encounter (HOSPITAL_COMMUNITY)
Admission: AD | Disposition: A | Discharge status: Home Health | Payer: Self-pay | Source: Other Acute Inpatient Hospital | Attending: Orthopedic Surgery

## 2018-01-18 ENCOUNTER — Inpatient Hospital Stay (HOSPITAL_COMMUNITY): Payer: PRIVATE HEALTH INSURANCE | Admitting: Certified Registered"

## 2018-01-18 ENCOUNTER — Encounter (HOSPITAL_COMMUNITY): Payer: Self-pay

## 2018-01-18 HISTORY — PX: AMPUTATION: SHX166

## 2018-01-18 LAB — CBC WITH DIFFERENTIAL/PLATELET
BASOS PCT: 0 %
Basophils Absolute: 0.1 10*3/uL (ref 0.0–0.1)
EOS PCT: 0 %
Eosinophils Absolute: 0 10*3/uL (ref 0.0–0.7)
HEMATOCRIT: 21.7 % — AB (ref 39.0–52.0)
Hemoglobin: 6.6 g/dL — CL (ref 13.0–17.0)
Lymphocytes Relative: 5 %
Lymphs Abs: 0.7 10*3/uL (ref 0.7–4.0)
MCH: 30.4 pg (ref 26.0–34.0)
MCHC: 30.4 g/dL (ref 30.0–36.0)
MCV: 100 fL (ref 78.0–100.0)
MONO ABS: 0.2 10*3/uL (ref 0.1–1.0)
MONOS PCT: 2 %
NEUTROS ABS: 13.9 10*3/uL — AB (ref 1.7–7.7)
Neutrophils Relative %: 93 %
Platelets: 450 10*3/uL — ABNORMAL HIGH (ref 150–400)
RBC: 2.17 MIL/uL — ABNORMAL LOW (ref 4.22–5.81)
RDW: 14.2 % (ref 11.5–15.5)
WBC: 14.9 10*3/uL — ABNORMAL HIGH (ref 4.0–10.5)

## 2018-01-18 LAB — COMPREHENSIVE METABOLIC PANEL
ALBUMIN: 2.7 g/dL — AB (ref 3.5–5.0)
ALT: 16 U/L — ABNORMAL LOW (ref 17–63)
ANION GAP: 8 (ref 5–15)
AST: 28 U/L (ref 15–41)
Alkaline Phosphatase: 47 U/L (ref 38–126)
BILIRUBIN TOTAL: 0.6 mg/dL (ref 0.3–1.2)
BUN: 7 mg/dL (ref 6–20)
CO2: 26 mmol/L (ref 22–32)
Calcium: 8.8 mg/dL — ABNORMAL LOW (ref 8.9–10.3)
Chloride: 103 mmol/L (ref 101–111)
Creatinine, Ser: 0.8 mg/dL (ref 0.61–1.24)
GFR calc Af Amer: >60 mL/min (ref >60)
Glucose, Bld: 143 mg/dL — ABNORMAL HIGH (ref 65–99)
POTASSIUM: 4.3 mmol/L (ref 3.5–5.1)
Sodium: 137 mmol/L (ref 135–145)
TOTAL PROTEIN: 6.6 g/dL (ref 6.5–8.1)

## 2018-01-18 LAB — GLUCOSE, CAPILLARY
GLUCOSE-CAPILLARY: 88 mg/dL (ref 65–99)
Glucose-Capillary: 103 mg/dL — ABNORMAL HIGH (ref 65–99)
Glucose-Capillary: 107 mg/dL — ABNORMAL HIGH (ref 65–99)
Glucose-Capillary: 91 mg/dL (ref 65–99)

## 2018-01-18 LAB — PREPARE RBC (CROSSMATCH)

## 2018-01-18 SURGERY — AMPUTATION BELOW KNEE
Anesthesia: General | Laterality: Left

## 2018-01-18 MED ORDER — HYDROMORPHONE HCL 1 MG/ML IJ SOLN
INTRAMUSCULAR | Status: AC
  Administered 2018-01-18: 0.5 mg via INTRAVENOUS
  Filled 2018-01-18: qty 1

## 2018-01-18 MED ORDER — 0.9 % SODIUM CHLORIDE (POUR BTL) OPTIME
TOPICAL | Status: DC | PRN
  Administered 2018-01-18: 1000 mL

## 2018-01-18 MED ORDER — PROPOFOL 10 MG/ML IV BOLUS
INTRAVENOUS | Status: DC | PRN
  Administered 2018-01-18: 200 mg via INTRAVENOUS

## 2018-01-18 MED ORDER — FENTANYL CITRATE (PF) 250 MCG/5ML IJ SOLN
INTRAMUSCULAR | Status: AC
  Filled 2018-01-18: qty 5

## 2018-01-18 MED ORDER — ONDANSETRON HCL 4 MG/2ML IJ SOLN
INTRAMUSCULAR | Status: AC
  Filled 2018-01-18: qty 2

## 2018-01-18 MED ORDER — LIDOCAINE 2% (20 MG/ML) 5 ML SYRINGE
INTRAMUSCULAR | Status: DC | PRN
  Administered 2018-01-18: 80 mg via INTRAVENOUS

## 2018-01-18 MED ORDER — POLYETHYLENE GLYCOL 3350 17 G PO PACK: 17.0000 g | PACK | Freq: Every day | ORAL | Status: DC | PRN

## 2018-01-18 MED ORDER — MAGNESIUM CITRATE PO SOLN
1.0000 | Freq: Once | ORAL | Status: DC | PRN
Start: 2018-01-18 — End: 2018-01-25

## 2018-01-18 MED ORDER — SODIUM CHLORIDE 0.9 % IV SOLN
INTRAVENOUS | Status: DC
  Administered 2018-01-18: 18:00:00 via INTRAVENOUS

## 2018-01-18 MED ORDER — DOCUSATE SODIUM 100 MG PO CAPS
100.0000 mg | ORAL_CAPSULE | Freq: Two times a day (BID) | ORAL | Status: DC
  Administered 2018-01-18 – 2018-01-25 (×4): 100 mg via ORAL
  Filled 2018-01-18 (×9): qty 1

## 2018-01-18 MED ORDER — MIDAZOLAM HCL 2 MG/2ML IJ SOLN
INTRAMUSCULAR | Status: AC
  Filled 2018-01-18: qty 2

## 2018-01-18 MED ORDER — DEXAMETHASONE SODIUM PHOSPHATE 10 MG/ML IJ SOLN
INTRAMUSCULAR | Status: AC
  Filled 2018-01-18: qty 1

## 2018-01-18 MED ORDER — ONDANSETRON HCL 4 MG/2ML IJ SOLN: 4.0000 mg | Freq: Four times a day (QID) | INTRAMUSCULAR | Status: DC | PRN

## 2018-01-18 MED ORDER — PROMETHAZINE HCL 25 MG/ML IJ SOLN: 6.2500 mg | INTRAMUSCULAR | Status: DC | PRN

## 2018-01-18 MED ORDER — FENTANYL CITRATE (PF) 250 MCG/5ML IJ SOLN
INTRAMUSCULAR | Status: DC | PRN
  Administered 2018-01-18: 50 ug via INTRAVENOUS
  Administered 2018-01-18 (×6): 25 ug via INTRAVENOUS

## 2018-01-18 MED ORDER — MIDAZOLAM HCL 5 MG/5ML IJ SOLN
INTRAMUSCULAR | Status: DC | PRN
  Administered 2018-01-18: 2 mg via INTRAVENOUS

## 2018-01-18 MED ORDER — LIDOCAINE HCL (CARDIAC) 20 MG/ML IV SOLN
INTRAVENOUS | Status: AC
  Filled 2018-01-18: qty 5

## 2018-01-18 MED ORDER — ONDANSETRON HCL 4 MG/2ML IJ SOLN
INTRAMUSCULAR | Status: DC | PRN
  Administered 2018-01-18: 4 mg via INTRAVENOUS

## 2018-01-18 MED ORDER — BISACODYL 10 MG RE SUPP: 10.0000 mg | Freq: Every day | RECTAL | Status: DC | PRN

## 2018-01-18 MED ORDER — METHOCARBAMOL 1000 MG/10ML IJ SOLN: 500.0000 mg | Freq: Four times a day (QID) | INTRAVENOUS | Status: DC | PRN

## 2018-01-18 MED ORDER — SODIUM CHLORIDE 0.9 % IV SOLN
Freq: Once | INTRAVENOUS | Status: AC
  Administered 2018-01-18: 16:00:00 via INTRAVENOUS

## 2018-01-18 MED ORDER — METOCLOPRAMIDE HCL 5 MG/ML IJ SOLN: 5.0000 mg | Freq: Three times a day (TID) | INTRAMUSCULAR | Status: DC | PRN

## 2018-01-18 MED ORDER — METHOCARBAMOL 500 MG PO TABS
ORAL_TABLET | ORAL | Status: AC
  Filled 2018-01-18: qty 1

## 2018-01-18 MED ORDER — ONDANSETRON HCL 4 MG PO TABS: 4.0000 mg | ORAL_TABLET | Freq: Four times a day (QID) | ORAL | Status: DC | PRN

## 2018-01-18 MED ORDER — DEXAMETHASONE SODIUM PHOSPHATE 10 MG/ML IJ SOLN
INTRAMUSCULAR | Status: DC | PRN
  Administered 2018-01-18: 5 mg via INTRAVENOUS

## 2018-01-18 MED ORDER — METOCLOPRAMIDE HCL 5 MG PO TABS: 5.0000 mg | ORAL_TABLET | Freq: Three times a day (TID) | ORAL | Status: DC | PRN

## 2018-01-18 MED ORDER — HYDROMORPHONE HCL 1 MG/ML IJ SOLN
0.2500 mg | INTRAMUSCULAR | Status: DC | PRN
  Administered 2018-01-18 (×2): 0.5 mg via INTRAVENOUS

## 2018-01-18 MED ORDER — HYDROCODONE-ACETAMINOPHEN 5-325 MG PO TABS
ORAL_TABLET | ORAL | Status: AC
  Filled 2018-01-18: qty 2

## 2018-01-18 MED ORDER — METHOCARBAMOL 500 MG PO TABS
500.0000 mg | ORAL_TABLET | Freq: Four times a day (QID) | ORAL | Status: DC | PRN
  Administered 2018-01-18: 500 mg via ORAL

## 2018-01-18 MED ORDER — LACTATED RINGERS IV SOLN
INTRAVENOUS | Status: DC
  Administered 2018-01-18: 10:00:00 via INTRAVENOUS

## 2018-01-18 SURGICAL SUPPLY — 33 items
BLADE SAW RECIP 87.9 MT (BLADE) ×2 IMPLANT
BLADE SURG 21 STRL SS (BLADE) ×2 IMPLANT
BNDG COHESIVE 6X5 TAN STRL LF (GAUZE/BANDAGES/DRESSINGS) ×4 IMPLANT
BNDG GAUZE ELAST 4 BULKY (GAUZE/BANDAGES/DRESSINGS) ×4 IMPLANT
COVER SURGICAL LIGHT HANDLE (MISCELLANEOUS) ×2 IMPLANT
CUFF TOURNIQUET SINGLE 34IN LL (TOURNIQUET CUFF) IMPLANT
CUFF TOURNIQUET SINGLE 44IN (TOURNIQUET CUFF) IMPLANT
DRAPE INCISE IOBAN 66X45 STRL (DRAPES) IMPLANT
DRAPE U-SHAPE 47X51 STRL (DRAPES) ×2 IMPLANT
DRESSING PREVENA PLUS CUSTOM (GAUZE/BANDAGES/DRESSINGS) ×1 IMPLANT
DRSG PREVENA PLUS CUSTOM (GAUZE/BANDAGES/DRESSINGS) ×2
ELECT REM PT RETURN 9FT ADLT (ELECTROSURGICAL) ×2
ELECTRODE REM PT RTRN 9FT ADLT (ELECTROSURGICAL) ×1 IMPLANT
GLOVE BIOGEL PI IND STRL 9 (GLOVE) ×1 IMPLANT
GLOVE BIOGEL PI INDICATOR 9 (GLOVE) ×1
GLOVE SURG ORTHO 9.0 STRL STRW (GLOVE) ×2 IMPLANT
GOWN STRL REUS W/ TWL XL LVL3 (GOWN DISPOSABLE) ×2 IMPLANT
GOWN STRL REUS W/TWL XL LVL3 (GOWN DISPOSABLE) ×2
KIT BASIN OR (CUSTOM PROCEDURE TRAY) ×2 IMPLANT
KIT DRSG PREVENA PLUS 7DAY 125 (MISCELLANEOUS) ×1 IMPLANT
KIT TURNOVER KIT B (KITS) ×2 IMPLANT
MANIFOLD NEPTUNE II (INSTRUMENTS) ×2 IMPLANT
NS IRRIG 1000ML POUR BTL (IV SOLUTION) ×2 IMPLANT
PACK ORTHO EXTREMITY (CUSTOM PROCEDURE TRAY) ×2 IMPLANT
PAD ARMBOARD 7.5X6 YLW CONV (MISCELLANEOUS) ×2 IMPLANT
PREVENA INCISION MGT 90 150 (MISCELLANEOUS) ×1 IMPLANT
SPONGE LAP 18X18 X RAY DECT (DISPOSABLE) IMPLANT
STAPLER VISISTAT 35W (STAPLE) IMPLANT
STOCKINETTE IMPERVIOUS LG (DRAPES) ×2 IMPLANT
SUT SILK 2 0 (SUTURE) ×1
SUT SILK 2-0 18XBRD TIE 12 (SUTURE) ×1 IMPLANT
SUT VIC AB 1 CTX 27 (SUTURE) IMPLANT
TOWEL OR 17X26 10 PK STRL BLUE (TOWEL DISPOSABLE) ×2 IMPLANT

## 2018-01-18 NOTE — Anesthesia Procedure Notes (Signed)
Procedure Name: LMA Insertion Date/Time: 01/18/2018 10:51 AM Performed by: Lucinda Dellecarlo, Donalee Gaumond M, CRNA Pre-anesthesia Checklist: Patient identified, Emergency Drugs available, Suction available and Patient being monitored Patient Re-evaluated:Patient Re-evaluated prior to induction Oxygen Delivery Method: Circle system utilized Preoxygenation: Pre-oxygenation with 100% oxygen Induction Type: IV induction Ventilation: Mask ventilation without difficulty LMA: LMA inserted LMA Size: 5.0 Tube type: Oral Placement Confirmation: positive ETCO2 and breath sounds checked- equal and bilateral Tube secured with: Tape Dental Injury: Teeth and Oropharynx as per pre-operative assessment

## 2018-01-18 NOTE — Anesthesia Postprocedure Evaluation (Signed)
Anesthesia Post Note  Patient: Aaron Mcconnell  Procedure(s) Performed: AMPUTATION BELOW KNEE LEFT (Left )     Patient location during evaluation: PACU Anesthesia Type: General Level of consciousness: awake and alert Pain management: pain level controlled Vital Signs Assessment: post-procedure vital signs reviewed and stable Respiratory status: spontaneous breathing, nonlabored ventilation and respiratory function stable Cardiovascular status: blood pressure returned to baseline and stable Postop Assessment: no apparent nausea or vomiting Anesthetic complications: no    Last Vitals:  Vitals:   01/18/18 1205 01/18/18 1220  BP: (!) 159/99 (!) 151/90  Pulse: 66 78  Resp: 17 18  Temp:    SpO2: 100% 100%    Last Pain:  Vitals:   01/18/18 1230  TempSrc:   PainSc: 7                  Lowella CurbWarren Ray Shariq Puig

## 2018-01-18 NOTE — Transfer of Care (Signed)
Immediate Anesthesia Transfer of Care Note  Patient: Aaron Mcconnell  Procedure(s) Performed: AMPUTATION BELOW KNEE LEFT (Left )  Patient Location: PACU  Anesthesia Type:General  Level of Consciousness: awake, alert  and oriented  Airway & Oxygen Therapy: Patient Spontanous Breathing and Patient connected to nasal cannula oxygen  Post-op Assessment: Report given to RN and Post -op Vital signs reviewed and stable  Post vital signs: Reviewed and stable  Last Vitals:  Vitals Value Taken Time  BP    Temp    Pulse 80 01/18/2018 11:47 AM  Resp    SpO2 97 % 01/18/2018 11:47 AM  Vitals shown include unvalidated device data.  Last Pain:  Vitals:   01/18/18 0630  TempSrc: Oral  PainSc:       Patients Stated Pain Goal: 3 (01/17/18 2227)  Complications: No apparent anesthesia complications

## 2018-01-18 NOTE — Interval H&P Note (Signed)
History and Physical Interval Note:  01/18/2018 6:40 AM  Aaron Mcconnell  has presented today for surgery, with the diagnosis of Necrotizing Fascitis Left Leg  The various methods of treatment have been discussed with the patient and family. After consideration of risks, benefits and other options for treatment, the patient has consented to  Procedure(s): AMPUTATION BELOW KNEE LEFT (Left) as a surgical intervention .  The patient's history has been reviewed, patient examined, no change in status, stable for surgery.  I have reviewed the patient's chart and labs.  Questions were answered to the patient's satisfaction.     Nadara MustardMarcus V Amenda Duclos

## 2018-01-18 NOTE — Progress Notes (Signed)
Patient ID: Aaron Mcconnell, male   DOB: 06/15/1976, 42 y.o.   MRN: [id number] Hgb 6.6, Type and cross and transfuse with 3 units PRBC, plan for AKA wednesday

## 2018-01-18 NOTE — Progress Notes (Signed)
PT Cancellation Note  Patient Details Name: Aaron Mcconnell MRN: [id number] DOB: 03/15/1976   Cancelled Treatment:    Reason Eval/Treat Not Completed: Medical issues which prohibited therapy Pt Hgb below 7, will re attempt tomorrow after transfusion. Patient emotional at this time.   Etta GrandchildSean Gillian Meeuwsen, PT, DPT Acute Rehab Services Pager: [phone]     Etta GrandchildSean  Alfredia Desanctis 01/18/2018, 5:20 PM

## 2018-01-18 NOTE — Progress Notes (Signed)
Report received from lab at this time that patient has hemoglobin of 6.6.  Will report to MD

## 2018-01-18 NOTE — Plan of Care (Signed)
  Problem: Health Behavior/Discharge Planning: Goal: Ability to manage health-related needs will improve Outcome: Progressing   Problem: Clinical Measurements: Goal: Ability to maintain clinical measurements within normal limits will improve Outcome: Progressing Goal: Will remain free from infection Outcome: Progressing Goal: Diagnostic test results will improve Outcome: Progressing Goal: Respiratory complications will improve Outcome: Progressing Goal: Cardiovascular complication will be avoided Outcome: Progressing   Problem: Activity: Goal: Risk for activity intolerance will decrease Outcome: Progressing   Problem: Coping: Goal: Level of anxiety will decrease Outcome: Progressing   Problem: Pain Managment: Goal: General experience of comfort will improve Outcome: Progressing   Problem: Safety: Goal: Ability to remain free from injury will improve Outcome: Progressing   Problem: Skin Integrity: Goal: Risk for impaired skin integrity will decrease Outcome: Progressing   

## 2018-01-18 NOTE — Anesthesia Preprocedure Evaluation (Addendum)
Anesthesia Evaluation  Patient identified by MRN, date of birth, ID band Patient awake  General Assessment Comment:Allergies: Erythromycin  Reviewed: Allergy & Precautions, NPO status , Patient's Chart, lab work & pertinent test results  Airway Mallampati: II  TM Distance: >3 FB Neck ROM: Full    Dental no notable dental hx. (+) Teeth Intact, Dental Advisory Given   Pulmonary neg pulmonary ROS,    Pulmonary exam normal breath sounds clear to auscultation       Cardiovascular Exercise Tolerance: Good negative cardio ROS Normal cardiovascular exam Rhythm:Regular Rate:Normal     Neuro/Psych    GI/Hepatic negative GI ROS, Neg liver ROS,   Endo/Other  negative endocrine ROS  Renal/GU negative Renal ROS  negative genitourinary   Musculoskeletal negative musculoskeletal ROS (+) Left leg infection    Abdominal Normal abdominal exam  (+)   Peds  Hematology  (+) Blood dyscrasia (Thrombocytopenia), anemia ,   Anesthesia Other Findings   Reproductive/Obstetrics                             Anesthesia Physical  Anesthesia Plan  ASA: III  Anesthesia Plan: General   Post-op Pain Management:    Induction: Intravenous  PONV Risk Score and Plan: 2 and Dexamethasone and Ondansetron  Airway Management Planned: LMA  Additional Equipment:   Intra-op Plan:   Post-operative Plan: Extubation in OR  Informed Consent: I have reviewed the patients History and Physical, chart, labs and discussed the procedure including the risks, benefits and alternatives for the proposed anesthesia with the patient or authorized representative who has indicated his/her understanding and acceptance.   Dental advisory given  Plan Discussed with: CRNA and Surgeon  Anesthesia Plan Comments:        Anesthesia Quick Evaluation

## 2018-01-18 NOTE — Op Note (Signed)
01/18/2018  11:28 AM  PATIENT:  Aaron Mcconnell Buesing    PRE-OPERATIVE DIAGNOSIS:  Necrotizing Fascitis Left Leg  POST-OPERATIVE DIAGNOSIS:  Same  PROCEDURE:  AMPUTATION BELOW KNEE LEFT  SURGEON:  Nadara MustardMarcus V Jayani Rozman, MD  PHYSICIAN ASSISTANT:None ANESTHESIA:   General  PREOPERATIVE INDICATIONS:  Aaron Mcconnell Saldarriaga is a  42 y.o. male with a diagnosis of Necrotizing Fascitis Left Leg who failed conservative measures and elected for surgical management.    The risks benefits and alternatives were discussed with the patient preoperatively including but not limited to the risks of infection, bleeding, nerve injury, cardiopulmonary complications, the need for revision surgery, among others, and the patient was willing to proceed.  OPERATIVE IMPLANTS: Praveena incisional wound VAC.  @ENCIMAGES @  OPERATIVE FINDINGS: Necrosis of the entire soleus and gastrocnemius complex as well as the anterior bundle  OPERATIVE PROCEDURE: Patient was brought the operating room and underwent a general anesthetic.  After adequate levels of anesthesia were obtained patient's left lower extremity was prepped using DuraPrep draped into a sterile field a timeout was called.  A transverse incision was made 11 cm distal to the tibial tubercle this curve proximally and a large posterior flap was created.  The tibia was resected 1 cm proximal to the skin incision beveled anteriorly and the fibula was transected just proximal to the tibial incision.  The anterior bundle was clamped and secured with 2-0 silk.  The posterior bundle was also closed with 2-0 silk.  The sciatic nerve was pulled cut and allowed to retract.  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  The  wound was irrigated with normal saline electrocautery was used for hemostasis.  The toe was deflated and hemostasis was obtained.  The flap was then closed with 2-0 nylon.  Patient was extubated taken the PACU in stable condition.  Patient will need to return to the operating room for an above-the-knee amputation.  With skeletonized tibia and fibula and only a skin flap to cover the bone patient would not have a durable residual limb for prosthesis there is no muscle anteriorly or posterior to provide support.  Patient will need to return to the operating room for above-knee amputation.  DISCHARGE PLANNING:  Antibiotic duration: Continue IV antibiotics  Weightbearing: Nonweightbearing on the left  Pain medication: Continue current medications  Dressing care/ Wound VAC: Incisional wound VAC  Ambulatory devices: Transfer training  Discharge to: Discharge will be delayed until above-knee amputation is completed.  Follow-up: In the office 1 week post operative.

## 2018-01-18 NOTE — Progress Notes (Signed)
Family requests to see Dr Lajoyce Cornersuda as soon as possible.  Dr Lajoyce Cornersuda aware and will come to speak with family in room later this afternoon.

## 2018-01-19 ENCOUNTER — Encounter (HOSPITAL_COMMUNITY): Payer: Self-pay | Admitting: Orthopedic Surgery

## 2018-01-19 LAB — HEMOGLOBIN AND HEMATOCRIT, BLOOD
HCT: 25.1 % — ABNORMAL LOW (ref 39.0–52.0)
Hemoglobin: 8 g/dL — ABNORMAL LOW (ref 13.0–17.0)

## 2018-01-19 LAB — GLUCOSE, CAPILLARY
GLUCOSE-CAPILLARY: 100 mg/dL — AB (ref 65–99)
GLUCOSE-CAPILLARY: 104 mg/dL — AB (ref 65–99)
Glucose-Capillary: 101 mg/dL — ABNORMAL HIGH (ref 65–99)
Glucose-Capillary: 105 mg/dL — ABNORMAL HIGH (ref 65–99)
Glucose-Capillary: 119 mg/dL — ABNORMAL HIGH (ref 65–99)

## 2018-01-19 NOTE — Progress Notes (Signed)
Left message for on call doctor to return call regarding low hgb 8.0. Waiting on call to be returned.

## 2018-01-19 NOTE — Evaluation (Signed)
Physical Therapy Evaluation Patient Details Name: Aaron Mcconnell Bugh MRN: [id number] DOB: 06/24/1976 Today's Date: 01/19/2018   History of Present Illness  Admitted with LLE extensive infection, necrotizing fasciitis; Now s/p decompression with extensive debridement and VAC placement;  has no past medical history on file.  Pt underwent repeat I&D 01-11-18, then BKA 3/27. Now awaiting AKA planned for next Wed 4/ 3.    Clinical Impression  Patient is s/p above surgery resulting in functional limitations due to the deficits listed below (see PT Problem List). PTA, patient independent working. Patient has been in hospital for several weeks for limb salvage and is emotioanlly distraught and eager to return home and start recovery with therapies. Discussed at length benefits of OP ortho therapy and eventual prosthetic fitting, and advised patient to stay active and compliant with HEP in the interim. Patient with self limiting behavior but willing to practice transfers this visit. Min guard for safety and line mgt. Patient denies interest in CIR and reports he would like to get home and closer to work. Feel given home set up this will be safe and patient will be careful with sliding transfers and work well with home therapies to progress his functional movement.  Patient will benefit from skilled PT to increase their independence and safety with mobility to allow discharge to the venue listed below.       Follow Up Recommendations Home health PT;Supervision - Intermittent    Equipment Recommendations  Rolling walker with 5" wheels;3in1 (PT);Wheelchair (measurements PT)    Recommendations for Other Services OT consult     Precautions / Restrictions Precautions Precautions: Fall Restrictions LLE Weight Bearing: Non weight bearing      Mobility  Bed Mobility Overal bed mobility: Needs Assistance Bed Mobility: Supine to Sit;Sit to Supine     Supine to sit: Supervision Sit to supine:  Supervision      Transfers Overall transfer level: Needs assistance Equipment used: Rolling walker (2 wheeled) Transfers: Sit to/from Stand Sit to Stand: Min guard   Squat pivot transfers: Min guard     General transfer comment: min guard cues for technique, practiced stand pivots into chari and back.  Ambulation/Gait             General Gait Details: pt denied  Information systems managertairs            Wheelchair Mobility    Modified Rankin (Stroke Patients Only)       Balance Overall balance assessment: Needs assistance   Sitting balance-Leahy Scale: Good       Standing balance-Leahy Scale: Poor                               Pertinent Vitals/Pain Pain Assessment: Faces Faces Pain Scale: Hurts little more Pain Location: LLE Pain Descriptors / Indicators: Discomfort;Grimacing;Aching Pain Intervention(s): Limited activity within patient's tolerance;Premedicated before session;Monitored during session;Repositioned    Home Living Family/patient expects to be discharged to:: Private residence Living Arrangements: Alone Available Help at Discharge: Family;Friend(s);Available PRN/intermittently Type of Home: House Home Access: Level entry     Home Layout: Multi-level;Able to live on main level with bedroom/bathroom Home Equipment: Grab bars - tub/shower;None;Shower seat - built in Additional Comments: Staying in his basement den where there is also a bathroom and kitchen    Prior Function Level of Independence: Independent         Comments: working     Higher education careers adviserHand Dominance  Extremity/Trunk Assessment   Upper Extremity Assessment Upper Extremity Assessment: Overall WFL for tasks assessed    Lower Extremity Assessment Lower Extremity Assessment: Overall WFL for tasks assessed(LLE BKA )       Communication   Communication: No difficulties  Cognition Arousal/Alertness: Awake/alert Behavior During Therapy: WFL for tasks  assessed/performed Overall Cognitive Status: Within Functional Limits for tasks assessed                                 General Comments: Pt has been emotionally distraught over amputations.      General Comments General comments (skin integrity, edema, etc.): Discussed long term benefits of therapy and eventual prosthetic usage     Exercises Amputee Exercises Hip Extension: 10 reps Hip ABduction/ADduction: 10 reps   Assessment/Plan    PT Assessment Patient needs continued PT services  PT Problem List Decreased strength;Decreased range of motion;Decreased activity tolerance;Decreased balance;Decreased mobility;Decreased knowledge of use of DME;Decreased knowledge of precautions;Pain       PT Treatment Interventions DME instruction;Gait training;Stair training;Functional mobility training;Therapeutic activities;Therapeutic exercise;Patient/family education    PT Goals (Current goals can be found in the Care Plan section)  Acute Rehab PT Goals Patient Stated Goal: wants to get home and back to work PT Goal Formulation: With patient Time For Goal Achievement: 01/29/18 Potential to Achieve Goals: Good    Frequency Min 5X/week   Barriers to discharge Decreased caregiver support      Co-evaluation               AM-PAC PT "6 Clicks" Daily Activity  Outcome Measure Difficulty turning over in bed (including adjusting bedclothes, sheets and blankets)?: None Difficulty moving from lying on back to sitting on the side of the bed? : None Difficulty sitting down on and standing up from a chair with arms (e.g., wheelchair, bedside commode, etc,.)?: A Little Help needed moving to and from a bed to chair (including a wheelchair)?: A Little Help needed walking in hospital room?: A Lot Help needed climbing 3-5 steps with a railing? : Total 6 Click Score: 17    End of Session Equipment Utilized During Treatment: Gait belt Activity Tolerance: Patient tolerated  treatment well Patient left: in bed;with call bell/phone within reach Nurse Communication: Mobility status PT Visit Diagnosis: Other abnormalities of gait and mobility (R26.89);Pain;Muscle weakness (generalized) (M62.81) Pain - Right/Left: Left Pain - part of body: Leg    Time: 1610-9604 PT Time Calculation (min) (ACUTE ONLY): 28 min   Charges:   PT Evaluation $PT Eval Low Complexity: 1 Low PT Treatments $Therapeutic Activity: 8-22 mins   PT G Codes:       Etta Grandchild, PT, DPT Acute Rehab Services Pager: [phone]    .  Etta Grandchild 01/19/2018, 6:53 PM

## 2018-01-19 NOTE — Progress Notes (Signed)
Subjective: Patient stable.  Wound VAC in place left lower extremity.  Pain controlled.   Objective: Vital signs in last 24 hours: Temp:  [98.3 F (36.8 C)-99.3 F (37.4 C)] 99.3 F (37.4 C) (03/30 0528) Pulse Rate:  [61-111] 71 (03/30 0828) Resp:  [16-20] 20 (03/30 0828) BP: (113-159)/(66-99) 119/69 (03/30 0828) SpO2:  [95 %-100 %] 95 % (03/30 0828)  Intake/Output from previous day: 03/29 0701 - 03/30 0700 In: 3804.5 [P.O.:600; I.V.:2574.5; Blood:630] Out: 1500 [Urine:1300; Drains:100; Blood:100] Intake/Output this shift: No intake/output data recorded.  Exam:  No cellulitis present  Labs: Recent Labs    01/18/18 1422 01/19/18 0717  HGB 6.6* 8.0*   Recent Labs    01/18/18 1422 01/19/18 0717  WBC 14.9*  --   RBC 2.17*  --   HCT 21.7* 25.1*  PLT 450*  --    Recent Labs    01/18/18 1422  NA 137  K 4.3  CL 103  CO2 26  BUN 7  CREATININE 0.80  GLUCOSE 143*  CALCIUM 8.8*   No results for input(s): LABPT, INR in the last 72 hours.  Assessment/Plan: Plan at this time is to recheck hemoglobin tomorrow.  May need more transfusion prior to above-knee amputation on Wednesday   Burnard BuntingG Scott Estalee Mccandlish 01/19/2018, 10:27 AM

## 2018-01-19 NOTE — Progress Notes (Signed)
Call never returned however, note from Dr. August Saucerean in chart stating recheck hgb tomorrow 01/20/18 and that blood transfusion may be needed prior to surgery on Wednesday.

## 2018-01-20 ENCOUNTER — Encounter (HOSPITAL_COMMUNITY): Payer: Self-pay | Admitting: *Deleted

## 2018-01-20 LAB — CBC WITH DIFFERENTIAL/PLATELET
BASOS PCT: 1 %
Basophils Absolute: 0.1 10*3/uL (ref 0.0–0.1)
EOS ABS: 0.2 10*3/uL (ref 0.0–0.7)
Eosinophils Relative: 2 %
HCT: 23.8 % — ABNORMAL LOW (ref 39.0–52.0)
HEMOGLOBIN: 7.5 g/dL — AB (ref 13.0–17.0)
LYMPHS ABS: 1.5 10*3/uL (ref 0.7–4.0)
Lymphocytes Relative: 17 %
MCH: 29.6 pg (ref 26.0–34.0)
MCHC: 31.5 g/dL (ref 30.0–36.0)
MCV: 94.1 fL (ref 78.0–100.0)
Monocytes Absolute: 0.6 10*3/uL (ref 0.1–1.0)
Monocytes Relative: 7 %
NEUTROS ABS: 6.4 10*3/uL (ref 1.7–7.7)
NEUTROS PCT: 73 %
Platelets: 264 10*3/uL (ref 150–400)
RBC: 2.53 MIL/uL — AB (ref 4.22–5.81)
RDW: 16.9 % — ABNORMAL HIGH (ref 11.5–15.5)
WBC: 8.7 10*3/uL (ref 4.0–10.5)

## 2018-01-20 LAB — GLUCOSE, CAPILLARY
Glucose-Capillary: 98 mg/dL (ref 65–99)
Glucose-Capillary: 100 mg/dL — ABNORMAL HIGH (ref 65–99)
Glucose-Capillary: 118 mg/dL — ABNORMAL HIGH (ref 65–99)

## 2018-01-20 NOTE — Progress Notes (Signed)
Took call from Dr. August Saucerean regarding low hgb 7.5 who stated he will let Dr. Lajoyce Cornersuda decide if he wants to transfuse the patient or not.

## 2018-01-20 NOTE — Progress Notes (Signed)
Patient stable. Pain controlled. Hemoglobin drifted back down below 8.0 today He will likely need transfusion prior to above-knee amputation on Wednesday

## 2018-01-20 NOTE — Progress Notes (Signed)
Dressing change to right foot and wound appears to be more open with slight drainage when wiped. Wound cleansed with Normal saline and pattted dry, aquacel applied to wound with gauze covering it and dressing reapplied. Patient still has feeling in his right foot and denies any pain. No temperatures and he stated that he felt fine.

## 2018-01-20 NOTE — Progress Notes (Signed)
Paged WOC nurse at [phone]. Patient needs reconsult on right foot as wound is now open with slight drainage. Waiting on return call.

## 2018-01-20 NOTE — Progress Notes (Signed)
RN aware of trending low hemoglobin and paged MD due to hemoglobin being 7.5. Patient is asymptomatic, [facility] Surgery for AKA scheduled for Wednesday. Awaiting MD orders and intervention. MD has seen patient today, note was made. MD aware of situation. Will continue to monitor.

## 2018-01-21 ENCOUNTER — Other Ambulatory Visit (INDEPENDENT_AMBULATORY_CARE_PROVIDER_SITE_OTHER): Payer: Self-pay | Admitting: Orthopedic Surgery

## 2018-01-21 DIAGNOSIS — M726 Necrotizing fasciitis: Secondary | ICD-10-CM

## 2018-01-21 LAB — PREPARE RBC (CROSSMATCH)

## 2018-01-21 LAB — GLUCOSE, CAPILLARY
GLUCOSE-CAPILLARY: 96 mg/dL (ref 65–99)
Glucose-Capillary: 84 mg/dL (ref 65–99)
Glucose-Capillary: 88 mg/dL (ref 65–99)

## 2018-01-21 MED ORDER — SODIUM CHLORIDE 0.9 % IV SOLN
Freq: Once | INTRAVENOUS | Status: AC
  Administered 2018-01-21: 10:00:00 via INTRAVENOUS

## 2018-01-21 NOTE — Progress Notes (Signed)
OT Cancellation Note  Patient Details Name: Aaron Mcconnell MRN: [id number] DOB: 09/16/1976   Cancelled Treatment:    Reason Eval/Treat Not Completed: Medical issues which prohibited therapy(recieving blood)  Felecia ShellingJones, Odell Fasching B   Anais Denslow, Brynn   OTR/L Pager: [phone] Office: [phone] .  01/21/2018, 2:36 PM

## 2018-01-21 NOTE — Progress Notes (Signed)
Patient ID: Aaron Mcconnell, male   DOB: 06/09/1976, 42 y.o.   MRN: [id number] Patient has no systemic symptoms.  There is no pain in palpation of the thigh.  Wound VAC drainage has decreased and is minimal.  Patient's hemoglobin is dropped to 7.5 we will type and cross and transfuse with 2 units prior to surgery.  Plan for above-knee amputation on Wednesday.

## 2018-01-21 NOTE — Consult Note (Signed)
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  Present on admission.  Patient had been treating at home with Nu Skin for some time.   Wound type: neuropathic ulcer.  Callous to periwound Pressure Injury POA: Yes pressure and decreased sensation to feet. Left leg is overseen by surgical services  Periwound: callous Dressing procedure/placement/frequency:Previously discussed need for podiatry and paring of periwound callous for healing.  Verbalized understanding and agrees to see podiatrist   While inpatient, begin the following:  Cleanse right foot with NS and pat dry.  Apply Aquacel Ag to wound bed.  Cover with 4x4 gauze and kerlix/tape.  Change daily.  Will not follow at this time.  Please re-consult if needed.  Maple HudsonKaren Iaan Oregel RN BSN CWON Pager [phone]

## 2018-01-21 NOTE — Progress Notes (Signed)
PT Cancellation Note  Patient Details Name: Aaron Mcconnell Moeller MRN: [id number] DOB: 09/12/1976   Cancelled Treatment:    Reason Eval/Treat Not Completed: Medical issues which prohibited therapy(pt receiving blood, requests to wait until tomorrow morning)   Lawana ChambersVictoria L Zannie Locastro 01/21/2018, 2:18 PM

## 2018-01-22 LAB — BPAM RBC
BLOOD PRODUCT EXPIRATION DATE: 201904042359
BLOOD PRODUCT EXPIRATION DATE: 201904062359
BLOOD PRODUCT EXPIRATION DATE: 201904062359
Blood Product Expiration Date: 201904042359
Blood Product Expiration Date: 201904052359
ISSUE DATE / TIME: 201903300303
ISSUE DATE / TIME: 201904011357
ISSUE DATE / TIME: 201903291801
ISSUE DATE / TIME: 201903292259
ISSUE DATE / TIME: 201904011022
UNIT TYPE AND RH: 6200
UNIT TYPE AND RH: 6200
Unit Type and Rh: 6200
Unit Type and Rh: 6200
Unit Type and Rh: 6200

## 2018-01-22 LAB — TYPE AND SCREEN
ABO/RH(D): A POS
Antibody Screen: NEGATIVE
UNIT DIVISION: 0
UNIT DIVISION: 0
UNIT DIVISION: 0
Unit division: 0
Unit division: 0

## 2018-01-22 LAB — CBC
HEMATOCRIT: 28.5 % — AB (ref 39.0–52.0)
HEMOGLOBIN: 9.2 g/dL — AB (ref 13.0–17.0)
MCH: 29.9 pg (ref 26.0–34.0)
MCHC: 32.3 g/dL (ref 30.0–36.0)
MCV: 92.5 fL (ref 78.0–100.0)
Platelets: 283 10*3/uL (ref 150–400)
RBC: 3.08 MIL/uL — ABNORMAL LOW (ref 4.22–5.81)
RDW: 16.3 % — ABNORMAL HIGH (ref 11.5–15.5)
WBC: 6.2 10*3/uL (ref 4.0–10.5)

## 2018-01-22 LAB — GLUCOSE, CAPILLARY
GLUCOSE-CAPILLARY: 99 mg/dL (ref 65–99)
Glucose-Capillary: 105 mg/dL — ABNORMAL HIGH (ref 65–99)
Glucose-Capillary: 88 mg/dL (ref 65–99)
Glucose-Capillary: 88 mg/dL (ref 65–99)

## 2018-01-22 MED ORDER — CHLORHEXIDINE GLUCONATE 4 % EX LIQD: 60.0000 mL | Freq: Once | CUTANEOUS | Status: DC

## 2018-01-22 NOTE — Progress Notes (Signed)
Patient ID: Aaron Mcconnell, male   DOB: 02/18/1976, 42 y.o.   MRN: [id number] Plan for AKA tomorrow, CBC pending after transfusion with 2 units PRBC 

## 2018-01-22 NOTE — H&P (View-Only) (Signed)
Patient ID: Aaron Mcconnell, male   DOB: 10/28/1975, 42 y.o.   MRN: [id number] Plan for AKA tomorrow, CBC pending after transfusion with 2 units PRBC

## 2018-01-22 NOTE — Progress Notes (Signed)
Physical Therapy Treatment Patient Details Name: Aaron Mcconnell MRN: [id number] DOB: 08/22/1976 Today's Date: 01/22/2018    History of Present Illness Admitted with LLE extensive infection, necrotizing fasciitis; Now s/p decompression with extensive debridement and VAC placement;  has no past medical history on file.  Pt underwent repeat I&D 01-11-18, then BKA 3/27. Now awaiting AKA planned for next Wed 4/ 3.    PT Comments    Pt practiced standing and hopping with RW as well as transferring into w/c and propulsion around unit. WOuld be helpful if he could get his w/c that he will take home after AKA tomorrow to practice with before d/c. PT will continue to follow.    Follow Up Recommendations  Home health PT;Supervision - Intermittent     Equipment Recommendations  Rolling walker with 5" wheels;3in1 (PT);Wheelchair (measurements PT);Other (comment)(tall RW (6'5"))    Recommendations for Other Services OT consult     Precautions / Restrictions Precautions Precautions: Fall Restrictions Weight Bearing Restrictions: Yes LLE Weight Bearing: Non weight bearing    Mobility  Bed Mobility Overal bed mobility: Independent Bed Mobility: Supine to Sit;Sit to Supine     Supine to sit: Independent Sit to supine: Independent   General bed mobility comments: able to get up to sit and back to supine without assist  Transfers Overall transfer level: Needs assistance Equipment used: Rolling walker (2 wheeled);None Transfers: Sit to/from Agilent TechnologiesStand;Stand Pivot Transfers;Squat Pivot Transfers Sit to Stand: Supervision Stand pivot transfers: Supervision Squat pivot transfers: Supervision     General transfer comment: practiced standing to RW for hopping practice. Also transferred bed to w/c and back with SPT and squat pivot without AD  Ambulation/Gait Ambulation/Gait assistance: Min assist Ambulation Distance (Feet): 1 Feet Assistive device: Rolling walker (2 wheeled) Gait  Pattern/deviations: Step-to pattern     General Gait Details: able to hop safely with RW   Administratortairs            Wheelchair Mobility Wheelchair Mobility Wheelchair mobility: Yes Wheelchair propulsion: Both upper extremities Wheelchair parts: Supervision/cueing Distance: 500 Wheelchair Assistance Details (indicate cue type and reason): vc's for propeling w/c and turning  Modified Rankin (Stroke Patients Only)       Balance Overall balance assessment: No apparent balance deficits (not formally assessed)   Sitting balance-Leahy Scale: Normal                                      Cognition Arousal/Alertness: Awake/alert Behavior During Therapy: WFL for tasks assessed/performed Overall Cognitive Status: Within Functional Limits for tasks assessed                                 General Comments: pt ready to get AKA over with and move forward with therapy      Exercises Amputee Exercises Hip Extension: AROM;Left;Standing;10 reps    General Comments General comments (skin integrity, edema, etc.): discussed AKA exercises sheet      Pertinent Vitals/Pain Pain Assessment: Faces Faces Pain Scale: Hurts little more Pain Location: LLE Pain Descriptors / Indicators: Discomfort;Throbbing Pain Intervention(s): Limited activity within patient's tolerance;Monitored during session    Home Living                      Prior Function            PT Goals (current goals  can now be found in the care plan section) Acute Rehab PT Goals Patient Stated Goal: wants to get home and back to work PT Goal Formulation: With patient Time For Goal Achievement: 01/29/18 Potential to Achieve Goals: Good Progress towards PT goals: Progressing toward goals    Frequency    Min 5X/week      PT Plan Current plan remains appropriate    Co-evaluation              AM-PAC PT "6 Clicks" Daily Activity  Outcome Measure  Difficulty turning over  in bed (including adjusting bedclothes, sheets and blankets)?: None Difficulty moving from lying on back to sitting on the side of the bed? : None Difficulty sitting down on and standing up from a chair with arms (e.g., wheelchair, bedside commode, etc,.)?: A Little Help needed moving to and from a bed to chair (including a wheelchair)?: A Little Help needed walking in hospital room?: A Little Help needed climbing 3-5 steps with a railing? : Total 6 Click Score: 18    End of Session   Activity Tolerance: Patient tolerated treatment well Patient left: in bed;with call bell/phone within reach Nurse Communication: Mobility status PT Visit Diagnosis: Other abnormalities of gait and mobility (R26.89);Pain;Muscle weakness (generalized) (M62.81) Pain - Right/Left: Left Pain - part of body: Leg     Time: 1610-9604 PT Time Calculation (min) (ACUTE ONLY): 44 min  Charges:  $Gait Training: 8-22 mins $Therapeutic Exercise: 8-22 mins $Therapeutic Activity: 8-22 mins                    G Codes:       Lyanne Co, PT  Acute Rehab Services  [phone]    Lawana Chambers Dystany Duffy 01/22/2018, 12:23 PM

## 2018-01-23 ENCOUNTER — Inpatient Hospital Stay (HOSPITAL_COMMUNITY): Payer: PRIVATE HEALTH INSURANCE | Admitting: Anesthesiology

## 2018-01-23 ENCOUNTER — Encounter (HOSPITAL_COMMUNITY): Payer: Self-pay | Admitting: Certified Registered Nurse Anesthetist

## 2018-01-23 ENCOUNTER — Encounter (HOSPITAL_COMMUNITY)
Admission: AD | Disposition: A | Discharge status: Home Health | Payer: Self-pay | Source: Other Acute Inpatient Hospital | Attending: Orthopedic Surgery

## 2018-01-23 HISTORY — PX: AMPUTATION: SHX166

## 2018-01-23 LAB — GLUCOSE, CAPILLARY
GLUCOSE-CAPILLARY: 111 mg/dL — AB (ref 65–99)
GLUCOSE-CAPILLARY: 139 mg/dL — AB (ref 65–99)
GLUCOSE-CAPILLARY: 85 mg/dL (ref 65–99)
Glucose-Capillary: 128 mg/dL — ABNORMAL HIGH (ref 65–99)

## 2018-01-23 SURGERY — AMPUTATION, ABOVE KNEE
Anesthesia: General | Laterality: Left

## 2018-01-23 MED ORDER — PROMETHAZINE HCL 25 MG/ML IJ SOLN: 6.2500 mg | INTRAMUSCULAR | Status: DC | PRN

## 2018-01-23 MED ORDER — PROPOFOL 10 MG/ML IV BOLUS
INTRAVENOUS | Status: AC
  Filled 2018-01-23: qty 20

## 2018-01-23 MED ORDER — ONDANSETRON HCL 4 MG/2ML IJ SOLN
INTRAMUSCULAR | Status: DC | PRN
  Administered 2018-01-23: 4 mg via INTRAVENOUS

## 2018-01-23 MED ORDER — ONDANSETRON HCL 4 MG PO TABS: 4.0000 mg | ORAL_TABLET | Freq: Four times a day (QID) | ORAL | Status: DC | PRN

## 2018-01-23 MED ORDER — KETAMINE HCL 10 MG/ML IJ SOLN
INTRAMUSCULAR | Status: AC
  Filled 2018-01-23: qty 1

## 2018-01-23 MED ORDER — KETAMINE HCL 10 MG/ML IJ SOLN
INTRAMUSCULAR | Status: DC | PRN
  Administered 2018-01-23: 50 mg via INTRAVENOUS
  Administered 2018-01-23: 20 mg via INTRAVENOUS

## 2018-01-23 MED ORDER — HYDROMORPHONE HCL 1 MG/ML IJ SOLN
0.2500 mg | INTRAMUSCULAR | Status: DC | PRN
  Administered 2018-01-23 (×2): 0.5 mg via INTRAVENOUS

## 2018-01-23 MED ORDER — LACTATED RINGERS IV SOLN
INTRAVENOUS | Status: DC
  Administered 2018-01-23: 11:00:00 via INTRAVENOUS

## 2018-01-23 MED ORDER — 0.9 % SODIUM CHLORIDE (POUR BTL) OPTIME
TOPICAL | Status: DC | PRN
  Administered 2018-01-23: 1000 mL

## 2018-01-23 MED ORDER — POLYETHYLENE GLYCOL 3350 17 G PO PACK: 17.0000 g | PACK | Freq: Every day | ORAL | Status: DC | PRN

## 2018-01-23 MED ORDER — ONDANSETRON HCL 4 MG/2ML IJ SOLN: 4.0000 mg | Freq: Four times a day (QID) | INTRAMUSCULAR | Status: DC | PRN

## 2018-01-23 MED ORDER — DOCUSATE SODIUM 100 MG PO CAPS: 100.0000 mg | ORAL_CAPSULE | Freq: Two times a day (BID) | ORAL | Status: DC

## 2018-01-23 MED ORDER — MAGNESIUM CITRATE PO SOLN: 1.0000 | Freq: Once | ORAL | Status: DC | PRN

## 2018-01-23 MED ORDER — METOCLOPRAMIDE HCL 5 MG PO TABS: 5.0000 mg | ORAL_TABLET | Freq: Three times a day (TID) | ORAL | Status: DC | PRN

## 2018-01-23 MED ORDER — MIDAZOLAM HCL 2 MG/2ML IJ SOLN
INTRAMUSCULAR | Status: AC
  Filled 2018-01-23: qty 2

## 2018-01-23 MED ORDER — METOCLOPRAMIDE HCL 5 MG/ML IJ SOLN: 5.0000 mg | Freq: Three times a day (TID) | INTRAMUSCULAR | Status: DC | PRN

## 2018-01-23 MED ORDER — BISACODYL 10 MG RE SUPP: 10.0000 mg | Freq: Every day | RECTAL | Status: DC | PRN

## 2018-01-23 MED ORDER — PROPOFOL 10 MG/ML IV BOLUS
INTRAVENOUS | Status: DC | PRN
  Administered 2018-01-23: 200 mg via INTRAVENOUS

## 2018-01-23 MED ORDER — METHOCARBAMOL 500 MG PO TABS: 500.0000 mg | ORAL_TABLET | Freq: Four times a day (QID) | ORAL | Status: DC | PRN

## 2018-01-23 MED ORDER — SODIUM CHLORIDE 0.9 % IV SOLN
INTRAVENOUS | Status: DC
  Administered 2018-01-23: 14:00:00 via INTRAVENOUS

## 2018-01-23 MED ORDER — FENTANYL CITRATE (PF) 250 MCG/5ML IJ SOLN
INTRAMUSCULAR | Status: AC
  Filled 2018-01-23: qty 5

## 2018-01-23 MED ORDER — FENTANYL CITRATE (PF) 100 MCG/2ML IJ SOLN
INTRAMUSCULAR | Status: DC | PRN
  Administered 2018-01-23: 150 ug via INTRAVENOUS
  Administered 2018-01-23: 100 ug via INTRAVENOUS

## 2018-01-23 MED ORDER — HYDROMORPHONE HCL 1 MG/ML IJ SOLN
INTRAMUSCULAR | Status: AC
  Filled 2018-01-23: qty 0.5

## 2018-01-23 MED ORDER — MIDAZOLAM HCL 5 MG/5ML IJ SOLN
INTRAMUSCULAR | Status: DC | PRN
  Administered 2018-01-23: 2 mg via INTRAVENOUS

## 2018-01-23 MED ORDER — MIDAZOLAM HCL 2 MG/2ML IJ SOLN: 0.5000 mg | Freq: Once | INTRAMUSCULAR | Status: DC | PRN

## 2018-01-23 MED ORDER — METHOCARBAMOL 1000 MG/10ML IJ SOLN: 500.0000 mg | Freq: Four times a day (QID) | INTRAVENOUS | Status: DC | PRN

## 2018-01-23 MED ORDER — LIDOCAINE 2% (20 MG/ML) 5 ML SYRINGE
INTRAMUSCULAR | Status: DC | PRN
  Administered 2018-01-23: 30 mg via INTRAVENOUS

## 2018-01-23 MED ORDER — DEXAMETHASONE SODIUM PHOSPHATE 10 MG/ML IJ SOLN
INTRAMUSCULAR | Status: DC | PRN
  Administered 2018-01-23: 5 mg via INTRAVENOUS

## 2018-01-23 MED ORDER — MEPERIDINE HCL 50 MG/ML IJ SOLN: 6.2500 mg | INTRAMUSCULAR | Status: DC | PRN

## 2018-01-23 MED ORDER — KETAMINE HCL 100 MG/ML IJ SOLN
INTRAMUSCULAR | Status: AC
  Filled 2018-01-23: qty 1

## 2018-01-23 MED ORDER — HYDROMORPHONE HCL 1 MG/ML IJ SOLN
INTRAMUSCULAR | Status: AC
  Filled 2018-01-23: qty 1

## 2018-01-23 MED ORDER — HYDROMORPHONE HCL 1 MG/ML IJ SOLN
INTRAMUSCULAR | Status: DC | PRN
  Administered 2018-01-23 (×2): .5 mg via INTRAVENOUS

## 2018-01-23 SURGICAL SUPPLY — 34 items
BLADE SAW RECIP 87.9 MT (BLADE) ×3 IMPLANT
BNDG COHESIVE 6X5 TAN STRL LF (GAUZE/BANDAGES/DRESSINGS) ×3 IMPLANT
CANISTER WOUND CARE 500ML ATS (WOUND CARE) IMPLANT
COVER SURGICAL LIGHT HANDLE (MISCELLANEOUS) ×3 IMPLANT
CUFF TOURNIQUET SINGLE 34IN LL (TOURNIQUET CUFF) IMPLANT
DRAPE INCISE IOBAN 66X45 STRL (DRAPES) ×6 IMPLANT
DRAPE U-SHAPE 47X51 STRL (DRAPES) ×3 IMPLANT
DRESSING PREVENA PLUS CUSTOM (GAUZE/BANDAGES/DRESSINGS) ×1 IMPLANT
DRSG PREVENA PLUS CUSTOM (GAUZE/BANDAGES/DRESSINGS) ×6
DRSG VAC ATS LRG SENSATRAC (GAUZE/BANDAGES/DRESSINGS) ×2 IMPLANT
DURAPREP 26ML APPLICATOR (WOUND CARE) ×3 IMPLANT
ELECT REM PT RETURN 9FT ADLT (ELECTROSURGICAL) ×3
ELECTRODE REM PT RTRN 9FT ADLT (ELECTROSURGICAL) ×1 IMPLANT
GLOVE BIOGEL PI IND STRL 9 (GLOVE) ×1 IMPLANT
GLOVE BIOGEL PI INDICATOR 9 (GLOVE) ×2
GLOVE SURG ORTHO 9.0 STRL STRW (GLOVE) ×3 IMPLANT
GOWN STRL REUS W/ TWL XL LVL3 (GOWN DISPOSABLE) ×2 IMPLANT
GOWN STRL REUS W/TWL XL LVL3 (GOWN DISPOSABLE) ×4
KIT BASIN OR (CUSTOM PROCEDURE TRAY) ×3 IMPLANT
KIT TURNOVER KIT B (KITS) ×3 IMPLANT
MANIFOLD NEPTUNE II (INSTRUMENTS) ×3 IMPLANT
NS IRRIG 1000ML POUR BTL (IV SOLUTION) ×3 IMPLANT
PACK ORTHO EXTREMITY (CUSTOM PROCEDURE TRAY) ×3 IMPLANT
PAD ARMBOARD 7.5X6 YLW CONV (MISCELLANEOUS) ×3 IMPLANT
STAPLER VISISTAT 35W (STAPLE) IMPLANT
STOCKINETTE IMPERVIOUS LG (DRAPES) IMPLANT
SUT ETHILON 2 0 PSLX (SUTURE) ×6 IMPLANT
SUT SILK 2 0 (SUTURE) ×2
SUT SILK 2-0 18XBRD TIE 12 (SUTURE) ×1 IMPLANT
TOWEL GREEN STERILE FF (TOWEL DISPOSABLE) ×3 IMPLANT
TUBE CONNECTING 20'X1/4 (TUBING) ×1
TUBE CONNECTING 20X1/4 (TUBING) ×2 IMPLANT
WND VAC CANISTER 500ML (MISCELLANEOUS) ×2 IMPLANT
YANKAUER SUCT BULB TIP NO VENT (SUCTIONS) ×3 IMPLANT

## 2018-01-23 NOTE — Transfer of Care (Signed)
Immediate Anesthesia Transfer of Care Note  Patient: Aaron Mcconnell  Procedure(s) Performed: LEFT ABOVE KNEE AMPUTATION (Left )  Patient Location: PACU  Anesthesia Type:General  Level of Consciousness: awake, alert , oriented and patient cooperative  Airway & Oxygen Therapy: Patient Spontanous Breathing  Post-op Assessment: Report given to RN and Post -op Vital signs reviewed and stable  Post vital signs: Reviewed and stable  Last Vitals:  Vitals Value Taken Time  BP 166/99 01/23/2018 12:15 PM  Temp    Pulse 78 01/23/2018 12:16 PM  Resp 17 01/23/2018 12:16 PM  SpO2 100 % 01/23/2018 12:16 PM  Vitals shown include unvalidated device data.  Last Pain:  Vitals:   01/23/18 1010  TempSrc:   PainSc: 2       Patients Stated Pain Goal: 2 (01/23/18 1010)  Complications: No apparent anesthesia complications

## 2018-01-23 NOTE — Anesthesia Postprocedure Evaluation (Signed)
Anesthesia Post Note  Patient: Aaron Mcconnell  Procedure(s) Performed: LEFT ABOVE KNEE AMPUTATION (Left )     Patient location during evaluation: PACU Anesthesia Type: General Level of consciousness: awake and alert Pain management: pain level controlled (pt states he is OK and comfortable) Vital Signs Assessment: post-procedure vital signs reviewed and stable Respiratory status: spontaneous breathing, nonlabored ventilation and respiratory function stable Cardiovascular status: blood pressure returned to baseline and stable Postop Assessment: no apparent nausea or vomiting Anesthetic complications: no    Last Vitals:  Vitals:   01/23/18 1245 01/23/18 1302  BP: (!) 163/103 (!) 170/105  Pulse: 66 86  Resp: 16 17  Temp:    SpO2: 98% 100%    Last Pain:  Vitals:   01/23/18 1302  TempSrc:   PainSc: 8                  Tyliah Schlereth,E. Dema Timmons

## 2018-01-23 NOTE — Anesthesia Preprocedure Evaluation (Addendum)
Anesthesia Evaluation  Patient identified by MRN, date of birth, ID band Patient awake    Reviewed: Allergy & Precautions, NPO status , Patient's Chart, lab work & pertinent test results  History of Anesthesia Complications Negative for: history of anesthetic complications  Airway Mallampati: I  TM Distance: >3 FB Neck ROM: Full    Dental  (+) Dental Advisory Given   Pulmonary neg pulmonary ROS,    breath sounds clear to auscultation       Cardiovascular + Peripheral Vascular Disease (necrotizing fasciitis of leg)   Rhythm:Regular Rate:Normal     Neuro/Psych  Neuromuscular disease (peripheral neuropathy)    GI/Hepatic negative GI ROS, Neg liver ROS,   Endo/Other  negative endocrine ROS  Renal/GU negative Renal ROS     Musculoskeletal   Abdominal   Peds  Hematology  (+) Blood dyscrasia (Hb 9.2), ,   Anesthesia Other Findings   Reproductive/Obstetrics                            Anesthesia Physical Anesthesia Plan  ASA: III  Anesthesia Plan: General   Post-op Pain Management:    Induction: Intravenous  PONV Risk Score and Plan: 3 and Ondansetron, Dexamethasone and Diphenhydramine  Airway Management Planned: LMA  Additional Equipment:   Intra-op Plan:   Post-operative Plan:   Informed Consent: I have reviewed the patients History and Physical, chart, labs and discussed the procedure including the risks, benefits and alternatives for the proposed anesthesia with the patient or authorized representative who has indicated his/her understanding and acceptance.   Dental advisory given  Plan Discussed with: CRNA and Surgeon  Anesthesia Plan Comments: (Plan routine monitors, GA- LMA OK)        Anesthesia Quick Evaluation

## 2018-01-23 NOTE — Anesthesia Procedure Notes (Signed)
Procedure Name: LMA Insertion Date/Time: 01/23/2018 11:33 AM Performed by: White, Cordella RegisterKelsey Tena Adithi Gammon, CRNA Pre-anesthesia Checklist: Patient identified, Emergency Drugs available, Suction available and Patient being monitored Patient Re-evaluated:Patient Re-evaluated prior to induction Oxygen Delivery Method: Circle System Utilized Preoxygenation: Pre-oxygenation with 100% oxygen Induction Type: IV induction Ventilation: Mask ventilation without difficulty LMA: LMA inserted LMA Size: 5.0 Number of attempts: 1 Placement Confirmation: positive ETCO2 Tube secured with: Tape Dental Injury: Teeth and Oropharynx as per pre-operative assessment

## 2018-01-23 NOTE — Interval H&P Note (Signed)
History and Physical Interval Note:  01/23/2018 6:36 AM  Aaron Mcconnell  has presented today for surgery, with the diagnosis of Necrotizing Fascitis Left Leg  The various methods of treatment have been discussed with the patient and family. After consideration of risks, benefits and other options for treatment, the patient has consented to  Procedure(s): LEFT ABOVE KNEE AMPUTATION (Left) as a surgical intervention .  The patient's history has been reviewed, patient examined, no change in status, stable for surgery.  I have reviewed the patient's chart and labs.  Questions were answered to the patient's satisfaction.     Nadara MustardMarcus V Heavin Sebree

## 2018-01-23 NOTE — Progress Notes (Signed)
CBG at noon was done, patient was not available on 5N. Was located in the OR. Will continue to monitor

## 2018-01-23 NOTE — Op Note (Signed)
01/23/2018  12:09 PM  PATIENT:  Aaron Mcconnell    PRE-OPERATIVE DIAGNOSIS:  Necrotizing Fascitis Left Leg  POST-OPERATIVE DIAGNOSIS:  Same  PROCEDURE:  LEFT ABOVE KNEE AMPUTATION Application Praveena wound VAC.  SURGEON:  Nadara MustardMarcus V Duda, MD  PHYSICIAN ASSISTANT:None ANESTHESIA:   General  PREOPERATIVE INDICATIONS:  Aaron Mcconnell is a  42 y.o. male with a diagnosis of Necrotizing Fascitis Left Leg who failed conservative measures and elected for surgical management.    The risks benefits and alternatives were discussed with the patient preoperatively including but not limited to the risks of infection, bleeding, nerve injury, cardiopulmonary complications, the need for revision surgery, among others, and the patient was willing to proceed.  OPERATIVE IMPLANTS: Praveena wound VAC.  @ENCIMAGES @  OPERATIVE FINDINGS: Good healthy muscle no signs of any necrotizing fasciitis abscess or muscle involvement.  Patient was brought the operating room and underwent a general anesthetic.  After adequate levels of anesthesia were obtained  OPERATIVE PROCEDURE: Patient's left lower extremity was prepped using DuraPrep draped into a sterile field the below knee amputation was draped out of sterile field with impervious stockinette.  A fishmouth incision was made through the distal thigh this was carried down to the femur and the femur was resected 1 cm proximal to the skin incision.  The vascular bundles medially were clamped and suture ligated with 2-0 silk x2.  After completion of the above-knee amputation electrocautery was used for hemostasis.  There is no signs of any non-healthy tissue.  All muscle had good color contractility and consistency.  The deep fascial layers were closed with #1 Vicryl.  The skin was closed using 2-0 nylon.  A Praveena wound VAC was applied patient was extubated taken the PACU in stable condition.   DISCHARGE PLANNING:  Antibiotic duration: Continue antibiotics for  24 hours postoperatively.  Nonweightbearing on the left.  Weightbearing: Nonweightbearing on the left.  Pain medication: As needed.  Dressing care/ Wound VAC: Continue wound VAC for 1 week.  Ambulatory devices: Walker for transfers wheelchair for ambulation.  Discharge to home Friday or Saturday.  Discharge to: home Friday or saturday  Follow-up: In the office 1 week post operative.

## 2018-01-24 ENCOUNTER — Encounter (HOSPITAL_COMMUNITY): Payer: Self-pay | Admitting: Orthopedic Surgery

## 2018-01-24 LAB — GLUCOSE, CAPILLARY
GLUCOSE-CAPILLARY: 104 mg/dL — AB (ref 65–99)
GLUCOSE-CAPILLARY: 111 mg/dL — AB (ref 65–99)
Glucose-Capillary: 107 mg/dL — ABNORMAL HIGH (ref 65–99)

## 2018-01-24 MED ORDER — GABAPENTIN 300 MG PO CAPS
600.0000 mg | ORAL_CAPSULE | Freq: Three times a day (TID) | ORAL | Status: DC
  Administered 2018-01-24 – 2018-01-25 (×5): 600 mg via ORAL
  Filled 2018-01-24 (×5): qty 2

## 2018-01-24 NOTE — Progress Notes (Signed)
Patient ID: Aaron Mcconnell, male   DOB: 09/01/1976, 42 y.o.   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  Approximately 150 cc in the wound VAC canister patient does complain of increased pain which is elevating his blood pressure we will increase Neurontin to 3 times a day.  Anticipate discharge to home tomorrow if patient is safe with therapy.

## 2018-01-24 NOTE — Progress Notes (Signed)
Occupational Therapy Treatment Patient Details Name: Aaron Mcconnell MRN: [id number] DOB: 1975-10-26 Today's Date: 01/24/2018    History of present illness Admitted with LLE extensive infection, necrotizing fasciitis; Now s/p decompression with extensive debridement and VAC placement;  has no past medical history on file.  Pt underwent repeat I&D 01-11-18, then BKA 3/27. Now s/p AKA on 01/23/18.    OT comments  Pt progressing towards acute OT goals. Focus of session was strategies for ADLs and functional transfers, gauging fit and toleration of w/c. Pt completed squat-pivot EOB to his w/c at supervision level. Pt initially reporting good fit. Advised pt to sit up for about 30 minutes to gauge fit and toleration. D/c plan remains appropriate.    Follow Up Recommendations  Home health OT;Supervision/Assistance - 24 hour    Equipment Recommendations  3 in 1 bedside commode    Recommendations for Other Services      Precautions / Restrictions Precautions Precautions: Fall Restrictions Weight Bearing Restrictions: Yes LLE Weight Bearing: Non weight bearing       Mobility Bed Mobility Overal bed mobility: Independent Bed Mobility: Supine to Sit     Supine to sit: Independent        Transfers Overall transfer level: Needs assistance Equipment used: None Transfers: Squat Pivot Transfers Sit to Stand: Supervision   Squat pivot transfers: Supervision     General transfer comment: EOB to w/c.    Balance Overall balance assessment: No apparent balance deficits (not formally assessed)   Sitting balance-Leahy Scale: Normal       Standing balance-Leahy Scale: Poor                             ADL either performed or assessed with clinical judgement   ADL Overall ADL's : Needs assistance/impaired                                       General ADL Comments: Pt completed bed mobility and SPT EOB to w/c. Discussed home setup and ADL strategies.      Vision       Perception     Praxis      Cognition Arousal/Alertness: Awake/alert Behavior During Therapy: WFL for tasks assessed/performed Overall Cognitive Status: Within Functional Limits for tasks assessed                                 General Comments: pt ready to get AKA over with and move forward with therapy        Exercises     Shoulder Instructions       General Comments Advised to sit in w/c for about 30 minutes to gauge toleration and fit then call for assist to get back in bed or move to recliner.     Pertinent Vitals/ Pain       Pain Assessment: Faces Faces Pain Scale: Hurts little more Pain Location: LLE Pain Descriptors / Indicators: Discomfort;Throbbing Pain Intervention(s): Limited activity within patient's tolerance;Monitored during session;Repositioned  Home Living                                          Prior Functioning/Environment  Frequency  Min 2X/week        Progress Toward Goals  OT Goals(current goals can now be found in the care plan section)  Progress towards OT goals: Progressing toward goals  Acute Rehab OT Goals Patient Stated Goal: wants to get home and back to work OT Goal Formulation: With patient Time For Goal Achievement: 01/24/18 Potential to Achieve Goals: Good ADL Goals Pt Will Perform Grooming: with modified independence;sitting Pt Will Perform Lower Body Bathing: sitting/lateral leans;with modified independence Pt Will Perform Upper Body Dressing: with modified independence;sitting Pt Will Perform Lower Body Dressing: sitting/lateral leans;with modified independence Pt Will Transfer to Toilet: with supervision;squat pivot transfer;stand pivot transfer;bedside commode Pt Will Perform Toileting - Clothing Manipulation and hygiene: sitting/lateral leans;with modified independence Pt/caregiver will Perform Home Exercise Program: Increased strength;Both right and  left upper extremity;With theraband;With written HEP provided  Plan Discharge plan remains appropriate    Co-evaluation                 AM-PAC PT "6 Clicks" Daily Activity     Outcome Measure   Help from another person eating meals?: None Help from another person taking care of personal grooming?: None Help from another person toileting, which includes using toliet, bedpan, or urinal?: A Little Help from another person bathing (including washing, rinsing, drying)?: A Lot Help from another person to put on and taking off regular upper body clothing?: None Help from another person to put on and taking off regular lower body clothing?: A Little 6 Click Score: 20    End of Session    OT Visit Diagnosis: Other abnormalities of gait and mobility (R26.89)   Activity Tolerance Patient tolerated treatment well   Patient Left with call bell/phone within reach;with family/visitor present;Other (comment)(in w/c)   Nurse Communication          Time: 1300-1320 OT Time Calculation (min): 20 min  Charges: OT General Charges $OT Visit: 1 Visit OT Treatments $Self Care/Home Management : 8-22 mins    Pilar GrammesMathews, Tahj Lindseth H 01/24/2018, 1:30 PM

## 2018-01-24 NOTE — Progress Notes (Signed)
Physical Therapy Treatment Patient Details Name: Aaron Mcconnell Camera MRN: [id number] DOB: 05/05/1976 Today's Date: 01/24/2018    History of Present Illness Admitted with LLE extensive infection, necrotizing fasciitis; Now s/p decompression with extensive debridement and VAC placement;  has no past medical history on file.  Pt underwent repeat I&D 01-11-18, then BKA 3/27. Now s/p AKA on 01/23/18.     PT Comments    Patient refusing OOB this session due to just back to bed after up to w/c with OT.  Educated on w/c parts and management with mother and patient.  Also educated on exercises to perform today and progression of activity.  Patient did not want to try ambulation yet either.  Will check on pt prior to d/c tomorrow.   Follow Up Recommendations  Home health PT;Supervision - Intermittent     Equipment Recommendations  Rolling walker with 5" wheels;3in1 (PT);Wheelchair (measurements PT);Other (comment)(equipment in room)    Recommendations for Other Services       Precautions / Restrictions Precautions Precautions: Fall Restrictions Weight Bearing Restrictions: Yes LLE Weight Bearing: Non weight bearing    Mobility  Bed Mobility               General bed mobility comments: did not mobilize during PT due to just back to bed from up in w/c with OT  Transfers                    Ambulation/Gait                 Administratortairs            Wheelchair Mobility Wheelchair Mobility Wheelchair Assistance Details (indicate cue type and reason): time spent educating patient and mother in wheelchair in room delivered by [facility].  Educated on legrest removal, and application, armrests flipping back for transfers, how to fold chair and lift safely for in/out of car with mother return demonstrating lifting chair.    Modified Rankin (Stroke Patients Only)       Balance                                            Cognition Arousal/Alertness:  Awake/alert Behavior During Therapy: WFL for tasks assessed/performed Overall Cognitive Status: Within Functional Limits for tasks assessed                                        Exercises Other Exercises Other Exercises: All AKA exercises reviewed that were on handout in room.  Educated on ones to do now including glut sets and leg lifts as tolerated and to progress to others as pain better.    General Comments General comments (skin integrity, edema, etc.): Patient felt he had enough room in 18x18 w/c despite concerns he may need 20" wide.  Stated he has couple of inches on either side.  Explained better to be more narrow than wide due to fit for propulsion and ability to manuever in smaller ares.        Pertinent Vitals/Pain Pain Assessment: Faces Faces Pain Scale: Hurts even more Pain Location: LLE Pain Descriptors / Indicators: Discomfort;Throbbing Pain Intervention(s): Monitored during session;Limited activity within patient's tolerance    Home Living  Prior Function            PT Goals (current goals can now be found in the care plan section) Progress towards PT goals: Progressing toward goals    Frequency    Min 5X/week      PT Plan Current plan remains appropriate    Co-evaluation              AM-PAC PT "6 Clicks" Daily Activity  Outcome Measure  Difficulty turning over in bed (including adjusting bedclothes, sheets and blankets)?: None Difficulty moving from lying on back to sitting on the side of the bed? : None Difficulty sitting down on and standing up from a chair with arms (e.g., wheelchair, bedside commode, etc,.)?: A Little Help needed moving to and from a bed to chair (including a wheelchair)?: A Little Help needed walking in hospital room?: A Little Help needed climbing 3-5 steps with a railing? : Total 6 Click Score: 18    End of Session   Activity Tolerance: Patient tolerated treatment  well Patient left: in bed;with call bell/phone within reach   PT Visit Diagnosis: Other abnormalities of gait and mobility (R26.89);Pain;Muscle weakness (generalized) (M62.81) Pain - Right/Left: Left Pain - part of body: Leg     Time: 9528-4132 PT Time Calculation (min) (ACUTE ONLY): 17 min  Charges:  $Self Care/Home Management: Jun 15, 2023                    G CodesSheran Lawless, [place] [phone] 01/24/2018    Elray Mcgregor 01/24/2018, 5:00 PM

## 2018-01-24 NOTE — Progress Notes (Signed)
Discussed at home wound care of right foot with patient and mother. Patients mother did verbal teach back as well as dressing change with assistance from RN. Patients mother did very well with dressing change, and patient tolerated well.   Educated patient and family member on prevena wound vac and ambulation to the restroom. Patient and family member were very receptive to teaching and asked appropriate questions.   Will continue to educated patient and family.

## 2018-01-25 ENCOUNTER — Telehealth (INDEPENDENT_AMBULATORY_CARE_PROVIDER_SITE_OTHER): Payer: Self-pay

## 2018-01-25 LAB — GLUCOSE, CAPILLARY
GLUCOSE-CAPILLARY: 83 mg/dL (ref 65–99)
Glucose-Capillary: 91 mg/dL (ref 65–99)

## 2018-01-25 MED ORDER — HYDROCODONE-ACETAMINOPHEN 5-325 MG PO TABS: 1.0000 | ORAL_TABLET | Freq: Four times a day (QID) | ORAL | 0 refills | Status: DC | PRN

## 2018-01-25 MED ORDER — GABAPENTIN 300 MG PO CAPS: 600.0000 mg | ORAL_CAPSULE | Freq: Three times a day (TID) | ORAL | 0 refills | Status: DC

## 2018-01-25 NOTE — Progress Notes (Signed)
Reviewed discharge paperwork with mom, dad and Mr Earlene PlaterDavis with full understanding, IV removed clean dry and intact

## 2018-01-25 NOTE — Telephone Encounter (Signed)
Left message on Aaron Mcconnell's vm that the clinical information and prior authorization form have been faxed to [facility].  Awaiting response from them.  The patient may need to pay for the medication out-of[place] if we don't get a response from the insurance company today.  Once authorized, it should be possible to get that amount reimbursed.  Will hold this message for now.

## 2018-01-25 NOTE — Telephone Encounter (Signed)
Patient's mother Olegario MessierKathy called stating that a PA is needed for Hydrocodone for patient.  Patient is being discharged from the hospital today and would need that Rx for the weekend.   Had left AKA on 01/23/18.  Diagnosis and clinical notes are needed. Cb# is [phone].  [facility] # is [id number].  Please advise.  Thank you.

## 2018-01-25 NOTE — Progress Notes (Signed)
PT Cancellation Note  Patient Details Name: Aaron Mcconnell MRN: [id number] DOB: 04/06/1976   Cancelled Treatment:     Patient refusing OOB mobility today, citing that he has performed everything already and feels good with w/c mobility. Pt eager to return home. Discussed therex, progression, support groups for amputees and long term potential. Patient appears to be in better spirits today than prior visits and says he appreciates the care he's received from rehab team. PT to sign off, needs can be met at next venue of care.   Reinaldo Berber, PT, DPT Acute Rehab Services Pager: [phone]     Reinaldo Berber 01/25/2018, 11:50 AM

## 2018-01-25 NOTE — Discharge Summary (Signed)
Discharge Diagnoses:  Principal Problem:   Sepsis (HCC) Active Problems:   Osteomyelitis of ankle or foot, acute, unspecified laterality (HCC)   Necrotizing fasciitis of lower leg (HCC)   Peripheral neuropathy   Chronic alcohol abuse   Surgeries: Procedure(s): LEFT ABOVE KNEE AMPUTATION on 01/23/2018    Consultants:   Discharged Condition: Improved  Hospital Course: Aaron Mcconnell is an 42 y.o. male who was admitted 01/05/2018 with a chief complaint of necrotizing fasciitis left leg, with a final diagnosis of Necrotizing Fascitis Left Leg.  Patient was brought to the operating room on 01/23/2018 and underwent Procedure(s): LEFT ABOVE KNEE AMPUTATION.    Patient was given perioperative antibiotics:  Anti-infectives (From admission, onward)   Start     Dose/Rate Route Frequency Ordered Stop   01/18/18 0600  ceFAZolin (ANCEF) IVPB 2g/100 mL premix     2 g 200 mL/hr over 30 Minutes Intravenous On call to O.R. 01/17/18 1959 01/18/18 1054   01/16/18 0934  ceFAZolin (ANCEF) 2-4 GM/100ML-% IVPB    Note to Pharmacy:  Sandi Raveling   : cabinet override      01/16/18 0934 01/16/18 1130   01/16/18 0932  ceFAZolin (ANCEF) IVPB 2g/100 mL premix     2 g 200 mL/hr over 30 Minutes Intravenous On call to O.R. 01/16/18 0932 01/16/18 1145   01/11/18 1230  vancomycin (VANCOCIN) 1,250 mg in sodium chloride 0.9 % 250 mL IVPB  Status:  Discontinued     1,250 mg 166.7 mL/hr over 90 Minutes Intravenous Every 12 hours 01/11/18 1219 01/11/18 1412   01/09/18 0900  ceFAZolin (ANCEF) IVPB 2g/100 mL premix     2 g 200 mL/hr over 30 Minutes Intravenous On call to O.R. 01/09/18 0708 01/09/18 0945   01/06/18 0600  vancomycin (VANCOCIN) 1,250 mg in sodium chloride 0.9 % 250 mL IVPB  Status:  Discontinued     1,250 mg 166.7 mL/hr over 90 Minutes Intravenous Every 8 hours 01/05/18 2240 01/10/18 1451   01/05/18 0615  vancomycin (VANCOCIN) IVPB 1000 mg/200 mL premix  Status:  Discontinued     1,000 mg 200  mL/hr over 60 Minutes Intravenous Every 8 hours 01/05/18 0613 01/05/18 2240   01/05/18 0615  piperacillin-tazobactam (ZOSYN) IVPB 3.375 g     3.375 g 12.5 mL/hr over 240 Minutes Intravenous Every 8 hours 01/05/18 0613 01/25/18 0005    .  Patient was given sequential compression devices, early ambulation, and aspirin for DVT prophylaxis.  Recent vital signs:  Patient Vitals for the past 24 hrs:  BP Temp Temp src Pulse Resp SpO2  01/25/18 0458 (!) 142/94 98.1 F (36.7 C) Oral 81 16 98 %  01/24/18 2116 (!) 141/89 98.8 F (37.1 C) Oral 83 18 100 %  01/24/18 1500 (!) 140/96 98.4 F (36.9 C) Oral 84 17 97 %  .  Recent laboratory studies: No results found.  Discharge Medications:   Allergies as of 01/25/2018      Reactions   Erythromycin Itching   Oxycodone Itching      Medication List    TAKE these medications   gabapentin 300 MG capsule Commonly known as:  NEURONTIN Take 2 capsules (600 mg total) by mouth 3 (three) times daily.   HYDROcodone-acetaminophen 5-325 MG tablet Commonly known as:  NORCO/VICODIN Take 1-2 tablets by mouth every 6 (six) hours as needed for moderate pain.            Durable Medical Equipment  (From admission, onward)  Start     Ordered   01/22/18 1248  For home use only DME Walker rolling  Once    Comments:  6'5" tall  Question:  Patient needs a walker to treat with the following condition  Answer:  S/P BKA (below knee amputation), left (HCC)   01/22/18 1249   01/22/18 1238  For home use only DME 3 n 1  Once     01/22/18 1239   01/22/18 1238  For home use only DME lightweight manual wheelchair with seat cushion  Once    Comments:  Patient suffers from left BKA which impairs their ability to perform daily activities like ambulating in the home.  A  cane will not resolve  issue with performing activities of daily living. A wheelchair will allow patient to safely perform daily activities. Patient is not able to propel themselves in the  home using a standard weight wheelchair due to generalized weakness. . Patient can self propel in the lightweight wheelchair.  Accessories: elevating leg rests (ELRs), wheel locks, extensions and anti-tippers.  Patient is 6'5", 221 lbs   01/22/18 1239      Diagnostic Studies: Ct Foot Left W Contrast  Result Date: 01/06/2018 CLINICAL DATA:  Diffuse ankle and foot pain and swelling. Recent extensive surgical debridement of foot and calf wounds. EXAM: CT OF THE LOWER LEFT EXTREMITY WITH CONTRAST TECHNIQUE: Multidetector CT imaging of the lower left extremity was performed according to the standard protocol following intravenous contrast administration. COMPARISON:  Radiographs 01/04/2018 CONTRAST:  75mL ISOVUE-300 IOPAMIDOL (ISOVUE-300) INJECTION 61% FINDINGS: Evidence of recent surgical debridement with a large open wound involving the entire posterior aspect of the heel right down to the calcaneus. Associated air throughout the soft tissues and a wound VAC is noted laterally. Diffuse and marked subcutaneous soft tissue swelling/edema/fluid consistent with severe cellulitis. Diffuse myofasciitis involving the short flexor muscles. No definite findings for pyomyositis. Open wound noted along the medial aspect of the great toe distally with significant surrounding cellulitis. I do not see any obvious CT findings to suggest septic arthritis or osteomyelitis but MRI would be much more sensitive. IMPRESSION: 1. Diffuse cellulitis and myofasciitis without discrete drainable soft tissue abscess, pyomyositis, septic arthritis or osteomyelitis. 2. Large open wound along the posterior aspect of the heel with air in the soft tissues and a wound VAC in place. 3. Small open wound along the medial aspect of the great toe. Electronically Signed   By: Rudie MeyerP.  Gallerani M.D.   On: 01/06/2018 08:35    Patient benefited maximally from their hospital stay and there were no complications.     Disposition:  Discharge  Instructions    Negative Pressure Wound Therapy - Incisional   Complete by:  As directed    Attach incisional wound VAC to the portable Praveena pump.  Plug unit in periodically to ensure it stays charged.  Discontinue dressing if wound VAC alarms and stops working, and apply dry dressing.     Follow-up Information    Nadara Mustarduda, Marcus V, MD Follow up in 1 week(s).   Specialty:  Orthopedic Surgery Contact information: [address] [place] [address] [phone]            Signed: Nadara MustardMarcus V Duda 01/25/2018, 7:03 AM

## 2018-01-25 NOTE — Care Management Note (Addendum)
Case Management Note  Patient Details  Name: Aaron Mcconnell MRN: [id number] Date of Birth: 01/02/1976  Subjective/Objective:  42 yr  Old gentleman admittinHarriet Mcconnell with necrotizing fasciitis left foot , s/p multiple I&D's, and ultimately a left BKA .                Action/Plan: Case manager is working on setting up Home health for patient. Locating a agency in [place] that accepts his insurance. CM has faxed orders and op note, demographics to [facility] in [place]. Waiting for response, will continue to follow.     Expected Discharge Date:  01/25/18               Expected Discharge Plan:  Home w Home Health Services  In-House Referral:     Discharge planning Services  CM Consult  Post Acute Care Choice:  Home Health Choice offered to:     DME Arranged:  3-N-1, Walker rolling, Wheelchair manual DME Agency:  NA  HH Arranged:  PT, OT HH Agency:     Status of Service: completed. If discussed at Long Length of Stay Meetings, dates discussed:    Additional Comments:01/25/18: Case manager spoke with patient's mom concerning discharge plan, she has contacted Aaron Mcconnell with [facility] [place] [facility]. Case manager has spoken with Aaron Mcconnell [phone] and faxed OP notes, demographics, H&P to her. Start of Care will be Sunday, January 27, 2018. Case manager updated patient and family.  Aaron Mcconnell, Aaron Fratus [name], RN 01/25/2018, 3:39 PM

## 2018-01-25 NOTE — Telephone Encounter (Signed)
Spoke with Peyton NajjarLarry with [facility]:  The Norco was already filled without need for prior authorization.  I left this info on Kathy's vm (Mom).

## 2018-01-28 ENCOUNTER — Telehealth (INDEPENDENT_AMBULATORY_CARE_PROVIDER_SITE_OTHER): Payer: Self-pay | Admitting: Orthopedic Surgery

## 2018-01-28 ENCOUNTER — Telehealth (INDEPENDENT_AMBULATORY_CARE_PROVIDER_SITE_OTHER): Payer: Self-pay

## 2018-01-28 NOTE — Telephone Encounter (Signed)
Recommend that the gabapentin to be used primarily for the nerve pain.  And may supplement with the narcotic pain medicine as needed.  Once the narcotic pain medicine has been stopped then weaning off the gabapentin slowly as the next step.

## 2018-01-28 NOTE — Telephone Encounter (Signed)
Fleet ContrasRachel from [facility] called asking for a RX for a orthopedic shoe for the right foot. CB # [phone]

## 2018-01-28 NOTE — Telephone Encounter (Signed)
Patient's mother Olegario MessierKathy wanted to let Dr. Lajoyce Cornersuda know that patient is doing well at home, but she is concerned about patient taking Gabapentin and Hydrocodone.  Would like Dr. Audrie Liauda's advice as to how patient should take medications?  Olegario MessierKathy stated that patient is needing a referral letter to Infectious Disease, Dr. Leeroy BockBalaji Desai[place].  Stated that letter can be faxed to [phone], attn: Reva Boresindy Thompson or she can pick up the letter at patient's appt.on Wednesday, 01/30/18.  CB# is [phone].  Please advise.  Thank You.

## 2018-01-28 NOTE — Telephone Encounter (Signed)
Please advise on medication and I will take care of referral. thanks

## 2018-01-29 NOTE — Telephone Encounter (Signed)
I called and sw pt's mother and she states that the pharmacist has advised to not take these medications at the same time. I advised of message below and to follow pharmacists instructions. Pt has an appt tomorrow for vac removal confirmed this and she will call with any other questions.

## 2018-01-29 NOTE — Telephone Encounter (Signed)
Will advise at appt tomorrow.

## 2018-01-29 NOTE — Telephone Encounter (Signed)
Is there a specific shoe that you are wanting the pt to use on the right side? Can you write rx for this pt has a nurse only visit tomorrow and pt can pick up then.

## 2018-01-29 NOTE — Telephone Encounter (Signed)
Call patient.  He needs a well-cushioned sneaker I like the Hoka sneakers for the added cushion.

## 2018-01-30 ENCOUNTER — Ambulatory Visit (INDEPENDENT_AMBULATORY_CARE_PROVIDER_SITE_OTHER): Payer: PRIVATE HEALTH INSURANCE

## 2018-01-30 ENCOUNTER — Encounter (INDEPENDENT_AMBULATORY_CARE_PROVIDER_SITE_OTHER): Payer: Self-pay

## 2018-01-30 ENCOUNTER — Other Ambulatory Visit (INDEPENDENT_AMBULATORY_CARE_PROVIDER_SITE_OTHER): Payer: Self-pay

## 2018-01-30 VITALS — Ht 77.0 in | Wt 221.0 lb

## 2018-01-30 DIAGNOSIS — Z89612 Acquired absence of left leg above knee: Secondary | ICD-10-CM

## 2018-01-30 DIAGNOSIS — L97511 Non-pressure chronic ulcer of other part of right foot limited to breakdown of skin: Secondary | ICD-10-CM

## 2018-01-30 DIAGNOSIS — M726 Necrotizing fasciitis: Secondary | ICD-10-CM

## 2018-01-30 MED ORDER — HYDROCODONE-ACETAMINOPHEN 5-325 MG PO TABS: 1.0000 | ORAL_TABLET | Freq: Four times a day (QID) | ORAL | 0 refills | Status: DC | PRN

## 2018-01-30 NOTE — Progress Notes (Signed)
The pt is in the office today 7 days s/p a left above the knee amputation with Prevena wound vac. He is accompanied by his mother today ambulating in a wheelchair. The wound vac was removed and the incision is well approximated. There is spot amount of bloody drainage. There is no redness and minimal swelling. The pt was advised to wash the area with dial soap and water and a dry dressing was applied. Additional supplied were given to the pt and he was advised to change this daily. The pt states that he was given an rx for Vicodin 5/325 1-2 po q 6 hrs prn pain #60. He states that by the time he comes in next week for his follow up he will be out of medication and is asking for a refill today as [place] lives in [place], [place] and its a drive to come to the office. Dr. August Saucerean refilled the medication for the pt today. The pt's mother also asks for a referral to infectious disease to a Dr. Aggie CosierBalaji Desai in [place]. She advised that Dr. Lajoyce Cornersuda had wanted this referral to be made and that she has all the hospital records but requires a referral letter from Dr. Lajoyce Cornersuda. I advised that I will speak with Dr. Lajoyce Cornersuda about this today and fax over the requested letter to ATT: Reva Boresindy Thompson @ [phone]. The pt will call with any questions or concerns and follow up with Dr. Lajoyce Cornersuda next week.    Nakia Koble, RMA, [employer]

## 2018-01-31 ENCOUNTER — Telehealth (INDEPENDENT_AMBULATORY_CARE_PROVIDER_SITE_OTHER): Payer: Self-pay

## 2018-01-31 ENCOUNTER — Other Ambulatory Visit (INDEPENDENT_AMBULATORY_CARE_PROVIDER_SITE_OTHER): Payer: Self-pay | Admitting: Orthopedic Surgery

## 2018-01-31 MED ORDER — METHOCARBAMOL 500 MG PO TABS: 500.0000 mg | ORAL_TABLET | Freq: Three times a day (TID) | ORAL | 0 refills | Status: DC

## 2018-01-31 NOTE — Telephone Encounter (Signed)
Prescription sent for Robaxin for muscle spasm.  I can make a referral to infectious disease in her area who do they want need to make the referral to.  I do not know personally any infectious disease doctors in their area.

## 2018-01-31 NOTE — Telephone Encounter (Signed)
2 questions.... 1. The pt is requesting a muscle relaxer for muscle spasms that he has been having at night. 2. The pt would like for you to dictate a small note for infectious disease and the referral that they have requested.

## 2018-01-31 NOTE — Telephone Encounter (Signed)
Patient has been having a lot of muscle cramps since last evening and mom wants to know if he can have a muscle relaxer.

## 2018-02-01 NOTE — Telephone Encounter (Signed)
He already has an infectious disease doctor picked out Dr. Agg[place]Bal[place] just needs a short note and we can fax to them.

## 2018-02-04 ENCOUNTER — Other Ambulatory Visit (INDEPENDENT_AMBULATORY_CARE_PROVIDER_SITE_OTHER): Payer: Self-pay | Admitting: Orthopedic Surgery

## 2018-02-04 NOTE — Progress Notes (Signed)
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  Patient underwent multiple surgeries for limb salvage and despite aggressive debridement and multiple surgeries patient still had necrotic muscle that extended up to the knee joint.  Patient subsequently underwent a above-the-knee amputation with no signs of infection or muscle injury at this level.

## 2018-02-04 NOTE — Telephone Encounter (Signed)
Note sent

## 2018-02-04 NOTE — Telephone Encounter (Signed)
Faxed to Dr. Leeroy BockBalaji Desai's office [phone]

## 2018-02-07 ENCOUNTER — Ambulatory Visit (INDEPENDENT_AMBULATORY_CARE_PROVIDER_SITE_OTHER): Payer: PRIVATE HEALTH INSURANCE | Admitting: Orthopedic Surgery

## 2018-02-07 ENCOUNTER — Encounter (INDEPENDENT_AMBULATORY_CARE_PROVIDER_SITE_OTHER): Payer: Self-pay | Admitting: Orthopedic Surgery

## 2018-02-07 DIAGNOSIS — Z89612 Acquired absence of left leg above knee: Secondary | ICD-10-CM

## 2018-02-07 DIAGNOSIS — M726 Necrotizing fasciitis: Secondary | ICD-10-CM

## 2018-02-07 MED ORDER — HYDROCODONE-ACETAMINOPHEN 5-325 MG PO TABS: 1.0000 | ORAL_TABLET | Freq: Four times a day (QID) | ORAL | 0 refills | Status: DC | PRN

## 2018-02-07 NOTE — Progress Notes (Signed)
Office Visit Note   Patient: Aaron Mcconnell           Date of Birth: Jan 01, 1976           MRN: [id number] Visit Date: 02/07/2018              Requested by: No referring provider defined for this encounter. PCP: Patient, No Pcp Per  Chief Complaint  Patient presents with  . Right Foot - Pain  . Left Leg - Routine Post Op, Follow-up      HPI: Patient is status post left above-knee amputation for necrotizing fasciitis.  Assessment & Plan: Visit Diagnoses:  1. Necrotizing fasciitis of lower leg (HCC)   2. Hx of AKA (above knee amputation), left (HCC)     Plan: Will apply dry dressing today patient was given a prescription for a stump shrinker and a prosthesis to be obtained in [place] with a local orthotist that they want to work with.  Plan follow-up in the office in 2 weeks to remove the sutures.  Start wearing the stump shrinker as soon as possible.  Follow-Up Instructions: Return in about 2 weeks (around 02/21/2018).   Ortho Exam  Patient is alert, oriented, no adenopathy, well-dressed, normal affect, normal respiratory effort. Examination patient's incision is healing nicely he does have some ecchymosis and bruising but there is no cellulitis no odor there is a small amount of clear bloody drainage but no signs of infection.  Imaging: No results found. No images are attached to the encounter.  Labs: Lab Results  Component Value Date   REPTSTATUS 01/10/2018 FINAL 01/05/2018   GRAMSTAIN  01/05/2018    MODERATE WBC PRESENT, PREDOMINANTLY PMN FEW GRAM POSITIVE COCCI IN PAIRS FEW GRAM POSITIVE RODS RARE GRAM NEGATIVE RODS Performed at [facility], [address], [place], [place] [address]    CULT  01/05/2018    RARE ALCALIGENES FAECALIS MIXED ANAEROBIC FLORA PRESENT.  CALL LAB IF FURTHER IID REQUIRED.    LABORGA ALCALIGENES FAECALIS 01/05/2018    @LABSALLVALUES (HGBA1)@  There is no height or weight on file to calculate BMI.  Orders:  No  orders of the defined types were placed in this encounter.  Meds ordered this encounter  Medications  . HYDROcodone-acetaminophen (NORCO/VICODIN) 5-325 MG tablet    Sig: Take 1-2 tablets by mouth every 6 (six) hours as needed for moderate pain.    Dispense:  60 tablet    Refill:  0     Procedures: No procedures performed  Clinical Data: No additional findings.  ROS:  All other systems negative, except as noted in the HPI. Review of Systems  Objective: Vital Signs: There were no vitals taken for this visit.  Specialty Comments:  No specialty comments available.  PMFS History: Patient Active Problem List   Diagnosis Date Noted  . Peripheral neuropathy 01/14/2018  . Chronic alcohol abuse 01/14/2018  . Necrotizing fasciitis of lower leg (HCC)   . Sepsis (HCC) 01/05/2018  . Osteomyelitis of ankle or foot, acute, unspecified laterality (HCC) 01/05/2018   History reviewed. No pertinent past medical history.  Family History  Problem Relation Age of Onset  . Diabetes Mellitus II Mother     Past Surgical History:  Procedure Laterality Date  . AMPUTATION Left 01/18/2018   Procedure: AMPUTATION BELOW KNEE LEFT;  Surgeon: Nadara Mustard, MD;  Location: [facility] OR;  Service: Orthopedics;  Laterality: Left;  . AMPUTATION Left 01/23/2018   Procedure: LEFT ABOVE KNEE AMPUTATION;  Surgeon: Lajoyce Corners,  Randa EvensMarcus V, MD;  [facility] OR;  Service: Orthopedics;  Laterality: Left;  . APPLICATION OF WOUND VAC Left 01/07/2018   Procedure: APPLICATION OF WOUND VAC;  Surgeon: Roby LoftsHaddix, Kevin P, MD;  [facility] OR;  Service: Orthopedics;  Laterality: Left;  . APPLICATION OF WOUND VAC Left 01/09/2018   Procedure: APPLICATION OF WOUND VAC;  Surgeon: Nadara Mustarduda, Tamey Wanek V, MD;  [facility] OR;  Service: Orthopedics;  Laterality: Left;  . I&D EXTREMITY Left 01/05/2018   Procedure: IRRIGATION AND DEBRIDEMENT EXTREMITY;  Surgeon: Roby LoftsHaddix, Kevin P, MD;  [facility] OR;  Service: Orthopedics;  Laterality: Left;  . I&D  EXTREMITY Left 01/07/2018   Procedure: IRRIGATION AND DEBRIDEMENT EXTREMITY;  Surgeon: Roby LoftsHaddix, Kevin P, MD;  [facility] OR;  Service: Orthopedics;  Laterality: Left;  . I&D EXTREMITY Left 01/09/2018   Procedure: IRRIGATION AND DEBRIDEMENT LEFT LEG;  Surgeon: Nadara Mustarduda, Nayomi Tabron V, MD;  Location[facility] OR;  Service: Orthopedics;  Laterality: Left;  . I&D EXTREMITY Left 01/11/2018   Procedure: REPEAT IRRIGATION AND DEBRIDEMENT LEFT LEG, APPLY WOUND VAC;  Surgeon: Nadara Mustarduda, Towana Stenglein V, MD;  [facility] OR;  Service: Orthopedics;  Laterality: Left;  . I&D EXTREMITY Left 01/16/2018   Procedure: REPEAT IRRIGATION AND DEBRIDEMENT LEFT LEG, APPLY WOUND VAC;  Surgeon: Nadara Mustarduda, Kendy Haston V, MD;  [facility] OR;  Service: Orthopedics;  Laterality: Left;   Social History   Occupational History  . Not on file  Tobacco Use  . Smoking status: Never Smoker  . Smokeless tobacco: Never Used  Substance and Sexual Activity  . Alcohol use: Yes  . Drug use: No  . Sexual activity: Not on file

## 2018-02-14 ENCOUNTER — Telehealth (INDEPENDENT_AMBULATORY_CARE_PROVIDER_SITE_OTHER): Payer: Self-pay | Admitting: Orthopedic Surgery

## 2018-02-14 NOTE — Telephone Encounter (Signed)
Patients mom called, she said they saw infectious disease doctor and wanted to run by Dr. Lajoyce Cornersuda something that doctor had ordered. Give Aaron MessierKathy a call when possible # [phone]

## 2018-02-18 ENCOUNTER — Other Ambulatory Visit (INDEPENDENT_AMBULATORY_CARE_PROVIDER_SITE_OTHER): Payer: Self-pay | Admitting: Orthopedic Surgery

## 2018-02-18 NOTE — Telephone Encounter (Signed)
I called and pt's mother. She states that she will address all questions with Dr. Lajoyce Corners at visit this week.

## 2018-02-19 NOTE — Telephone Encounter (Signed)
Rx refill request

## 2018-02-19 NOTE — Telephone Encounter (Signed)
Call patient, he should not need to continue taking a muscle relaxer.

## 2018-02-21 ENCOUNTER — Ambulatory Visit (INDEPENDENT_AMBULATORY_CARE_PROVIDER_SITE_OTHER): Payer: PRIVATE HEALTH INSURANCE | Admitting: Orthopedic Surgery

## 2018-02-21 ENCOUNTER — Encounter (INDEPENDENT_AMBULATORY_CARE_PROVIDER_SITE_OTHER): Payer: Self-pay | Admitting: Orthopedic Surgery

## 2018-02-21 ENCOUNTER — Other Ambulatory Visit (INDEPENDENT_AMBULATORY_CARE_PROVIDER_SITE_OTHER): Payer: Self-pay | Admitting: Orthopedic Surgery

## 2018-02-21 ENCOUNTER — Telehealth (INDEPENDENT_AMBULATORY_CARE_PROVIDER_SITE_OTHER): Payer: Self-pay | Admitting: Orthopedic Surgery

## 2018-02-21 DIAGNOSIS — Z89612 Acquired absence of left leg above knee: Secondary | ICD-10-CM

## 2018-02-21 NOTE — Progress Notes (Signed)
Office Visit Note   Patient: Aaron Mcconnell           Date of Birth: May 05, 1976           MRN: [id number] Visit Date: 02/21/2018              Requested by: No referring provider defined for this encounter. PCP: Patient, No Pcp Per  No chief complaint on file.     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  Patient states that we will try to get out of bed he sustained a blunt trauma and had acute bleeding and wound dehiscence of the left above-knee amputation.  Assessment & Plan: Visit Diagnoses:  1. Hx of AKA (above knee amputation), left (HCC)     Plan: Patient states that he is being set up for a PICC line for IV antibiotics for the right foot.  Recommended that he call the infectious disease doctor who was scheduling his PICC line and discussed that he has a dehiscence of the wound from blunt trauma and that he would need some oral antibiotic coverage until he is covered with the IV antibiotics.  Follow-up in 1 week to harvest the sutures.  Follow-Up Instructions: Return in about 1 week (around 02/28/2018).   Ortho Exam  Patient is alert, oriented, no adenopathy, well-dressed, normal affect, normal respiratory effort. Examination there is wound dehiscence laterally this is gaped open about 5 mm in width approximately 2 cm in length.  There is a draining hematoma.  There is no redness no cellulitis no odor no purulent drainage.  Examination of the right foot he has a well-healing plantar ulcer I have been using silver alginate they will continue with this treatment they have schedule an MRI scan to further evaluate the right foot clinically there is no signs of abscess or infection there is no cellulitis he does have clawing of the toes and I recommended against anybody trying to proceed  with surgery to straighten his toes.  Imaging: No results found. No images are attached to the encounter.  Labs: Lab Results  Component Value Date   REPTSTATUS 01/10/2018 FINAL 01/05/2018   GRAMSTAIN  01/05/2018    MODERATE WBC PRESENT, PREDOMINANTLY PMN FEW GRAM POSITIVE COCCI IN PAIRS FEW GRAM POSITIVE RODS RARE GRAM NEGATIVE RODS Performed at [facility], [address] [place], [place] [address]    CULT  01/05/2018    RARE ALCALIGENES FAECALIS MIXED ANAEROBIC FLORA PRESENT.  CALL LAB IF FURTHER IID REQUIRED.    LABORGA ALCALIGENES FAECALIS 01/05/2018    No results found for: HGBA1C  There is no height or weight on file to calculate BMI.  Orders:  No orders of the defined types were placed in this encounter.  No orders of the defined types were placed in this encounter.    Procedures: No procedures performed  Clinical Data: No additional findings.  ROS:  All other systems negative, except as noted in the HPI. Review of Systems  Objective: Vital Signs: There were no vitals taken for this visit.  Specialty Comments:  No specialty comments available.  PMFS History: Patient Active Problem List   Diagnosis Date Noted  . Peripheral neuropathy 01/14/2018  . Chronic alcohol abuse 01/14/2018  . Necrotizing fasciitis of lower leg (HCC)   . Sepsis (HCC) 01/05/2018  .  Osteomyelitis of ankle or foot, acute, unspecified laterality (HCC) 01/05/2018   History reviewed. No pertinent past medical history.  Family History  Problem Relation Age of Onset  . Diabetes Mellitus II Mother     Past Surgical History:  Procedure Laterality Date  . AMPUTATION Left 01/18/2018   Procedure: AMPUTATION BELOW KNEE LEFT;  Surgeon: Nadara Mustard, MD;  Location: [facility] OR;  Service: Orthopedics;  Laterality: Left;  . AMPUTATION Left 01/23/2018   Procedure: LEFT ABOVE KNEE AMPUTATION;  Surgeon: Nadara Mustard, MD;  Location: [facility] OR;  Service: Orthopedics;  Laterality: Left;  .  APPLICATION OF WOUND VAC Left 01/07/2018   Procedure: APPLICATION OF WOUND VAC;  Surgeon: Roby Lofts, MD;  [facility] OR;  Service: Orthopedics;  Laterality: Left;  . APPLICATION OF WOUND VAC Left 01/09/2018   Procedure: APPLICATION OF WOUND VAC;  Surgeon: Nadara Mustard, MD;  Location: [facility] OR;  Service: Orthopedics;  Laterality: Left;  . I&D EXTREMITY Left 01/05/2018   Procedure: IRRIGATION AND DEBRIDEMENT EXTREMITY;  Surgeon: Roby Lofts, MD;  [facility] OR;  Service: Orthopedics;  Laterality: Left;  . I&D EXTREMITY Left 01/07/2018   Procedure: IRRIGATION AND DEBRIDEMENT EXTREMITY;  Surgeon: Roby Lofts, MD;  [facility] OR;  Service: Orthopedics;  Laterality: Left;  . I&D EXTREMITY Left 01/09/2018   Procedure: IRRIGATION AND DEBRIDEMENT LEFT LEG;  Surgeon: Nadara Mustard, MD;  Location: [facility] OR;  Service: Orthopedics;  Laterality: Left;  . I&D EXTREMITY Left 01/11/2018   Procedure: REPEAT IRRIGATION AND DEBRIDEMENT LEFT LEG, APPLY WOUND VAC;  Surgeon: Nadara Mustard, MD;  [facility] OR;  Service: Orthopedics;  Laterality: Left;  . I&D EXTREMITY Left 01/16/2018   Procedure: REPEAT IRRIGATION AND DEBRIDEMENT LEFT LEG, APPLY WOUND VAC;  Surgeon: Nadara Mustard, MD;  [facility] OR;  Service: Orthopedics;  Laterality: Left;   Social History   Occupational History  . Not on file  Tobacco Use  . Smoking status: Never Smoker  . Smokeless tobacco: Never Used  Substance and Sexual Activity  . Alcohol use: Yes  . Drug use: No  . Sexual activity: Not on file

## 2018-02-21 NOTE — Telephone Encounter (Signed)
Patient called wanting to make sure that his muscle relaxer (Robaxin) has been sent into the pharmacy. CB # [phone]

## 2018-02-22 ENCOUNTER — Telehealth (INDEPENDENT_AMBULATORY_CARE_PROVIDER_SITE_OTHER): Payer: Self-pay

## 2018-02-22 NOTE — Telephone Encounter (Signed)
Called with 2 questions (she said it was ok to wait until Monday, when Dr. Lajoyce Corners was back in the office): #1 - ok to hold off on PT until ROV on 02/28/18 & #2 - Family is asking if it is ok for them to change the silver alginate dressing 2-3 x week, instead of daily ([facility]N goes out 1 x week)?

## 2018-02-22 NOTE — Telephone Encounter (Signed)
Called and lm on vm to advise pt that Dr. Lajoyce Corners is not going to refill this medication.

## 2018-02-25 NOTE — Telephone Encounter (Signed)
I called and sw Arnold Long to advise that pt has an appt on Thursday and we can fax updated orders after this visit to [phone]

## 2018-02-27 NOTE — Telephone Encounter (Signed)
Fax orders, see below.

## 2018-02-28 ENCOUNTER — Encounter (INDEPENDENT_AMBULATORY_CARE_PROVIDER_SITE_OTHER): Payer: Self-pay | Admitting: Orthopedic Surgery

## 2018-02-28 ENCOUNTER — Ambulatory Visit (INDEPENDENT_AMBULATORY_CARE_PROVIDER_SITE_OTHER): Payer: PRIVATE HEALTH INSURANCE | Admitting: Orthopedic Surgery

## 2018-02-28 DIAGNOSIS — Z89612 Acquired absence of left leg above knee: Secondary | ICD-10-CM

## 2018-02-28 NOTE — Telephone Encounter (Signed)
Faxed

## 2018-02-28 NOTE — Progress Notes (Signed)
Office Visit Note   Patient: Aaron Mcconnell           Date of Birth: 09/16/1976           MRN: [id number] Visit Date: 02/28/2018              Requested by: No referring provider defined for this encounter. PCP: Patient, No Pcp Per  Chief Complaint  Patient presents with  . Left Leg - Follow-up, Routine Post Op      HPI: Patient is a 42 year old gentleman status post left above-knee amputation with a Waggoner grade 1 ulcer beneath the second metatarsal head right foot.  Patient is currently on ceftriaxone through a PICC line.    Assessment & Plan: Visit Diagnoses:  1. Hx of AKA (above knee amputation), left (HCC)     Plan: Recommend he continue with the Aleve for pain he is okay to take Vicodin with the Aleve but recommend against taking Tylenol with the Vicodin.  He is to get the new stump shrinker from his orthotist.  Continue with washing with soap and water and dry gauze dressing.  Follow-Up Instructions: Return in about 2 weeks (around 03/14/2018).   Ortho Exam  Patient is alert, oriented, no adenopathy, well-dressed, normal affect, normal respiratory effort. Examination the incision is healing nicely there is a little bit of resolving hematoma from his fall we will harvest the sutures today apply 4 x 4 plus an Ace wrap he will get a new shrinker from his orthotist.  There is no redness no cellulitis in the left leg or right foot.  The right foot Waggoner grade 1 ulcer is healing nicely.  He does have fixed clawing of the second toe.  Imaging: No results found. No images are attached to the encounter.  Labs: Lab Results  Component Value Date   REPTSTATUS 01/10/2018 FINAL 01/05/2018   GRAMSTAIN  01/05/2018    MODERATE WBC PRESENT, PREDOMINANTLY PMN FEW GRAM POSITIVE COCCI IN PAIRS FEW GRAM POSITIVE RODS RARE GRAM NEGATIVE RODS Performed at [facility], [address], [place], [place] [address]    CULT  01/05/2018    RARE ALCALIGENES FAECALIS MIXED  ANAEROBIC FLORA PRESENT.  CALL LAB IF FURTHER IID REQUIRED.    LABORGA ALCALIGENES FAECALIS 01/05/2018     Lab Results  Component Value Date   ALBUMIN 2.7 (L) 01/18/2018   ALBUMIN 1.9 (L) 01/06/2018   ALBUMIN 2.0 (L) 01/05/2018    There is no height or weight on file to calculate BMI.  Orders:  No orders of the defined types were placed in this encounter.  No orders of the defined types were placed in this encounter.    Procedures: No procedures performed  Clinical Data: No additional findings.  ROS:  All other systems negative, except as noted in the HPI. Review of Systems  Objective: Vital Signs: There were no vitals taken for this visit.  Specialty Comments:  No specialty comments available.  PMFS History: Patient Active Problem List   Diagnosis Date Noted  . Peripheral neuropathy 01/14/2018  . Chronic alcohol abuse 01/14/2018  . Necrotizing fasciitis of lower leg (HCC)   . Sepsis (HCC) 01/05/2018  . Osteomyelitis of ankle or foot, acute, unspecified laterality (HCC) 01/05/2018   History reviewed. No pertinent past medical history.  Family History  Problem Relation Age of Onset  . Diabetes Mellitus II Mother     Past Surgical History:  Procedure Laterality Date  . AMPUTATION Left 01/18/2018  Procedure: AMPUTATION BELOW KNEE LEFT;  Surgeon: Nadara Mustard, MD;  Location: [facility] OR;  Service: Orthopedics;  Laterality: Left;  . AMPUTATION Left 01/23/2018   Procedure: LEFT ABOVE KNEE AMPUTATION;  Surgeon: Nadara Mustard, MD;  Location: [facility] OR;  Service: Orthopedics;  Laterality: Left;  . APPLICATION OF WOUND VAC Left 01/07/2018   Procedure: APPLICATION OF WOUND VAC;  Surgeon: Roby Lofts, MD;  [facility] OR;  Service: Orthopedics;  Laterality: Left;  . APPLICATION OF WOUND VAC Left 01/09/2018   Procedure: APPLICATION OF WOUND VAC;  Surgeon: Nadara Mustard, MD;  Location: [facility] OR;  Service: Orthopedics;  Laterality: Left;  . I&D EXTREMITY Left 01/05/2018    Procedure: IRRIGATION AND DEBRIDEMENT EXTREMITY;  Surgeon: Roby Lofts, MD;  [facility]: [facility] OR;  Service: Orthopedics;  Laterality: Left;  . I&D EXTREMITY Left 01/07/2018   Procedure: IRRIGATION AND DEBRIDEMENT EXTREMITY;  Surgeon: Roby Lofts, MD;  [facility] OR;  Service: Orthopedics;  Laterality: Left;  . I&D EXTREMITY Left 01/09/2018   Procedure: IRRIGATION AND DEBRIDEMENT LEFT LEG;  Surgeon: Nadara Mustard, MD;  Location: [facility] OR;  Service: Orthopedics;  Laterality: Left;  . I&D EXTREMITY Left 01/11/2018   Procedure: REPEAT IRRIGATION AND DEBRIDEMENT LEFT LEG, APPLY WOUND VAC;  Surgeon: Nadara Mustard, MD;  [facility] OR;  Service: Orthopedics;  Laterality: Left;  . I&D EXTREMITY Left 01/16/2018   Procedure: REPEAT IRRIGATION AND DEBRIDEMENT LEFT LEG, APPLY WOUND VAC;  Surgeon: Nadara Mustard, MD;  Location: [facility] OR;  Service: Orthopedics;  Laterality: Left;   Social History   Occupational History  . Not on file  Tobacco Use  . Smoking status: Never Smoker  . Smokeless tobacco: Never Used  Substance and Sexual Activity  . Alcohol use: Yes  . Drug use: No  . Sexual activity: Not on file

## 2018-03-14 ENCOUNTER — Ambulatory Visit (INDEPENDENT_AMBULATORY_CARE_PROVIDER_SITE_OTHER): Payer: PRIVATE HEALTH INSURANCE | Admitting: Orthopedic Surgery

## 2018-03-14 ENCOUNTER — Encounter (INDEPENDENT_AMBULATORY_CARE_PROVIDER_SITE_OTHER): Payer: Self-pay | Admitting: Orthopedic Surgery

## 2018-03-14 VITALS — Ht 77.0 in | Wt 221.0 lb

## 2018-03-14 DIAGNOSIS — Z89612 Acquired absence of left leg above knee: Secondary | ICD-10-CM

## 2018-03-14 NOTE — Progress Notes (Signed)
Office Visit Note   Patient: Aaron Mcconnell           Date of Birth: 1976-01-10           MRN: [id number] Visit Date: 03/14/2018              Requested by: No referring provider defined for this encounter. PCP: Patient, No Pcp Per  Chief Complaint  Patient presents with  . Left Leg - Routine Post Op    01/23/18 left above the knee amputation       HPI: Patient is a 42 year old gentleman status post left above-knee amputation approximately 2 months out from surgery he is working with a Immunologist in [place] for prosthetic limb.  Patient is also undergone MRI scan and bone scan to the right foot which were reported as negative for osteomyelitis.  Patient during this time was on a PICC line for 9 days of IV antibiotics with oral antibiotics.  Assessment & Plan: Visit Diagnoses:  1. Hx of AKA (above knee amputation), left (HCC)     Plan: Patient was given a prescription for a prosthesis for the left above-knee amputation.  Patient will be a K3 level ambulator he has the strength desire and need to use a computer knee.  Prescription was written for this he will continue with dry dressing changes to the area of hyper granulation tissue of the left above-knee amputation.  Follow-Up Instructions: Return in about 3 weeks (around 04/04/2018).   Ortho Exam  Patient is alert, oriented, no adenopathy, well-dressed, normal affect, normal respiratory effort. Examination patient has good hair growth there is no redness no cellulitis no signs of infection.  There is a small area of 5 mm in diameter of hyper granulation tissue this was touched with silver nitrate and there was clear serosanguineous drainage.  I resorbed a 2 x 2 and a flat 4 x 4 were applied.  There is no tenderness to palpation no clinical signs of infection.  Patient is also just completed 9 days of IV antibiotics.  Imaging: No results found. No images are attached to the encounter.  Labs: Lab Results  Component Value Date     REPTSTATUS 01/10/2018 FINAL 01/05/2018   GRAMSTAIN  01/05/2018    MODERATE WBC PRESENT, PREDOMINANTLY PMN FEW GRAM POSITIVE COCCI IN PAIRS FEW GRAM POSITIVE RODS RARE GRAM NEGATIVE RODS Performed at [facility], [address] [place], [place] [address]    CULT  01/05/2018    RARE ALCALIGENES FAECALIS MIXED ANAEROBIC FLORA PRESENT.  CALL LAB IF FURTHER IID REQUIRED.    LABORGA ALCALIGENES FAECALIS 01/05/2018     Lab Results  Component Value Date   ALBUMIN 2.7 (L) 01/18/2018   ALBUMIN 1.9 (L) 01/06/2018   ALBUMIN 2.0 (L) 01/05/2018    Body mass index is 26.21 kg/m.  Orders:  No orders of the defined types were placed in this encounter.  No orders of the defined types were placed in this encounter.    Procedures: No procedures performed  Clinical Data: No additional findings.  ROS:  All other systems negative, except as noted in the HPI. Review of Systems  Objective: Vital Signs: Ht  (1.956 m)   Wt 221 lb (100.2 kg)   BMI 26.21 kg/m   Specialty Comments:  No specialty comments available.  PMFS History: Patient Active Problem List   Diagnosis Date Noted  . Peripheral neuropathy 01/14/2018  . Chronic alcohol abuse 01/14/2018  . Necrotizing fasciitis of lower  leg (HCC)   . Sepsis (HCC) 01/05/2018  . Osteomyelitis of ankle or foot, acute, unspecified laterality (HCC) 01/05/2018   History reviewed. No pertinent past medical history.  Family History  Problem Relation Age of Onset  . Diabetes Mellitus II Mother     Past Surgical History:  Procedure Laterality Date  . AMPUTATION Left 01/18/2018   Procedure: AMPUTATION BELOW KNEE LEFT;  Surgeon: Nadara Mustard, MD;  Location: [facility] OR;  Service: Orthopedics;  Laterality: Left;  . AMPUTATION Left 01/23/2018   Procedure: LEFT ABOVE KNEE AMPUTATION;  Surgeon: Nadara Mustard, MD;  Location: [facility] OR;  Service: Orthopedics;  Laterality: Left;  . APPLICATION OF WOUND VAC Left 01/07/2018   Procedure:  APPLICATION OF WOUND VAC;  Surgeon: Roby Lofts, MD;  [facility];  Service: Orthopedics;  Laterality: Left;  . APPLICATION OF WOUND VAC Left 01/09/2018   Procedure: APPLICATION OF WOUND VAC;  Surgeon: Nadara Mustard, MD;  Location: [facility] OR;  Service: Orthopedics;  Laterality: Left;  . I&D EXTREMITY Left 01/05/2018   Procedure: IRRIGATION AND DEBRIDEMENT EXTREMITY;  Surgeon: Roby Lofts, MD;  [facility] OR;  Service: Orthopedics;  Laterality: Left;  . I&D EXTREMITY Left 01/07/2018   Procedure: IRRIGATION AND DEBRIDEMENT EXTREMITY;  Surgeon: Roby Lofts, MD;  [facility] OR;  Service: Orthopedics;  Laterality: Left;  . I&D EXTREMITY Left 01/09/2018   Procedure: IRRIGATION AND DEBRIDEMENT LEFT LEG;  Surgeon: Nadara Mustard, MD;  [facility] OR;  Service: Orthopedics;  Laterality: Left;  . I&D EXTREMITY Left 01/11/2018   Procedure: REPEAT IRRIGATION AND DEBRIDEMENT LEFT LEG, APPLY WOUND VAC;  Surgeon: Nadara Mustard, MD;  [facility] OR;  Service: Orthopedics;  Laterality: Left;  . I&D EXTREMITY Left 01/16/2018   Procedure: REPEAT IRRIGATION AND DEBRIDEMENT LEFT LEG, APPLY WOUND VAC;  Surgeon: Nadara Mustard, MD;  Location: [facility] OR;  Service: Orthopedics;  Laterality: Left;   Social History   Occupational History  . Not on file  Tobacco Use  . Smoking status: Never Smoker  . Smokeless tobacco: Never Used  Substance and Sexual Activity  . Alcohol use: Yes  . Drug use: No  . Sexual activity: Not on file

## 2018-03-22 ENCOUNTER — Telehealth (INDEPENDENT_AMBULATORY_CARE_PROVIDER_SITE_OTHER): Payer: Self-pay | Admitting: Orthopedic Surgery

## 2018-03-22 NOTE — Telephone Encounter (Signed)
Last 3 OV notes faxed to [facility] to support need of Rx for his Prosthetic limb fax [phone], ph [phone]

## 2018-03-26 ENCOUNTER — Ambulatory Visit (INDEPENDENT_AMBULATORY_CARE_PROVIDER_SITE_OTHER): Payer: PRIVATE HEALTH INSURANCE | Admitting: Orthopedic Surgery

## 2018-03-26 ENCOUNTER — Encounter (INDEPENDENT_AMBULATORY_CARE_PROVIDER_SITE_OTHER): Payer: Self-pay | Admitting: Orthopedic Surgery

## 2018-03-26 ENCOUNTER — Other Ambulatory Visit (INDEPENDENT_AMBULATORY_CARE_PROVIDER_SITE_OTHER): Payer: Self-pay | Admitting: Orthopedic Surgery

## 2018-03-26 DIAGNOSIS — L97511 Non-pressure chronic ulcer of other part of right foot limited to breakdown of skin: Secondary | ICD-10-CM

## 2018-03-26 DIAGNOSIS — Z89612 Acquired absence of left leg above knee: Secondary | ICD-10-CM

## 2018-03-26 MED ORDER — DOXYCYCLINE HYCLATE 100 MG PO TABS: 100.0000 mg | ORAL_TABLET | Freq: Two times a day (BID) | ORAL | 0 refills | Status: DC

## 2018-03-26 NOTE — Progress Notes (Signed)
Office Visit Note   Patient: Aaron Mcconnell           Date of Birth: 07/04/1976           MRN: [id number] Visit Date: 03/26/2018              Requested by: No referring provider defined for this encounter. PCP: Patient, No Pcp Per  Chief Complaint  Patient presents with  . Left Leg - Follow-up  . Right Leg - Pain, Follow-up      HPI: Patient is a 42 year old gentleman who presents for 2 separate issues #1 Waggoner grade 1 ulcers x2 right foot and a new ulcer in the left above-the-knee amputation.  Patient states he has been fit for his prosthesis and will have the trial fitting next week.  Assessment & Plan: Visit Diagnoses:  1. Hx of AKA (above knee amputation), left (HCC)   2. Non-pressure chronic ulcer of other part of right foot limited to breakdown of skin (HCC)     Plan: Patient was given a prescription for doxycycline he will continue with antibiotic ointment dressing changes to the ulcers on the right foot and the ulcer on the left above-knee amputation.  Follow-Up Instructions: Return in about 1 week (around 04/02/2018).   Ortho Exam  Patient is alert, oriented, no adenopathy, well-dressed, normal affect, normal respiratory effort. Examination patient has a new ulcer on the left above-knee amputation.  This was debrided the ulcer is 5 mm in diameter 5 mm deep.  This was touched with silver nitrate there is no purulence this did not probe to bone.  Examination the right foot he has an ulcer beneath the second metatarsal head and on the plantar aspect of the right great toe.  After informed consent the great toe ulcer was debrided back to bleeding viable granulation tissue this was touched with silver nitrate the ulcer is 10 mm in diameter 1 mm deep.  The second metatarsal head ulcer is 5 mm in diameter and 1 mm deep.  Band-Aids were applied to both wounds.  There is no signs of infection in the right foot.  I am concerned with the new ulcer on the left above-knee  amputation and we will start doxycycline.  Imaging: No results found. No images are attached to the encounter.  Labs: Lab Results  Component Value Date   REPTSTATUS 01/10/2018 FINAL 01/05/2018   GRAMSTAIN  01/05/2018    MODERATE WBC PRESENT, PREDOMINANTLY PMN FEW GRAM POSITIVE COCCI IN PAIRS FEW GRAM POSITIVE RODS RARE GRAM NEGATIVE RODS Performed at [facility], [address], [place], [place] [address]    CULT  01/05/2018    RARE ALCALIGENES FAECALIS MIXED ANAEROBIC FLORA PRESENT.  CALL LAB IF FURTHER IID REQUIRED.    LABORGA ALCALIGENES FAECALIS 01/05/2018     Lab Results  Component Value Date   ALBUMIN 2.7 (L) 01/18/2018   ALBUMIN 1.9 (L) 01/06/2018   ALBUMIN 2.0 (L) 01/05/2018    There is no height or weight on file to calculate BMI.  Orders:  No orders of the defined types were placed in this encounter.  No orders of the defined types were placed in this encounter.    Procedures: No procedures performed  Clinical Data: No additional findings.  ROS:  All other systems negative, except as noted in the HPI. Review of Systems  Objective: Vital Signs: There were no vitals taken for this visit.  Specialty Comments:  No specialty comments available.  PMFS History:  Patient Active Problem List   Diagnosis Date Noted  . Peripheral neuropathy 01/14/2018  . Chronic alcohol abuse 01/14/2018  . Necrotizing fasciitis of lower leg (HCC)   . Sepsis (HCC) 01/05/2018  . Osteomyelitis of ankle or foot, acute, unspecified laterality (HCC) 01/05/2018   History reviewed. No pertinent past medical history.  Family History  Problem Relation Age of Onset  . Diabetes Mellitus II Mother     Past Surgical History:  Procedure Laterality Date  . AMPUTATION Left 01/18/2018   Procedure: AMPUTATION BELOW KNEE LEFT;  Surgeon: Nadara Mustarduda, Mame Twombly V, MD;  [facility];  Service: Orthopedics;  Laterality: Left;  . AMPUTATION Left 01/23/2018   Procedure: LEFT ABOVE KNEE  AMPUTATION;  Surgeon: Nadara Mustarduda, Duyen Beckom V, MD;  [facility];  Service: Orthopedics;  Laterality: Left;  . APPLICATION OF WOUND VAC Left 01/07/2018   Procedure: APPLICATION OF WOUND VAC;  Surgeon: Roby LoftsHaddix, Kevin P, MD;  [facility]: [facility] OR;  Service: Orthopedics;  Laterality: Left;  . APPLICATION OF WOUND VAC Left 01/09/2018   Procedure: APPLICATION OF WOUND VAC;  Surgeon: Nadara Mustarduda, Madhavi Hamblen V, MD;  [facility];  Service: Orthopedics;  Laterality: Left;  . I&D EXTREMITY Left 01/05/2018   Procedure: IRRIGATION AND DEBRIDEMENT EXTREMITY;  Surgeon: Roby LoftsHaddix, Kevin P, MD;  Location: [facility] OR;  Service: Orthopedics;  Laterality: Left;  . I&D EXTREMITY Left 01/07/2018   Procedure: IRRIGATION AND DEBRIDEMENT EXTREMITY;  Surgeon: Roby LoftsHaddix, Kevin P, MD;  Location: [facility] OR;  Service: Orthopedics;  Laterality: Left;  . I&D EXTREMITY Left 01/09/2018   Procedure: IRRIGATION AND DEBRIDEMENT LEFT LEG;  Surgeon: Nadara Mustarduda, Marissah Vandemark V, MD;  [facility];  Service: Orthopedics;  Laterality: Left;  . I&D EXTREMITY Left 01/11/2018   Procedure: REPEAT IRRIGATION AND DEBRIDEMENT LEFT LEG, APPLY WOUND VAC;  Surgeon: Nadara Mustarduda, Lillyian Heidt V, MD;  Location: [facility] OR;  Service: Orthopedics;  Laterality: Left;  . I&D EXTREMITY Left 01/16/2018   Procedure: REPEAT IRRIGATION AND DEBRIDEMENT LEFT LEG, APPLY WOUND VAC;  Surgeon: Nadara Mustarduda, Boris Engelmann V, MD;  Location: [facility] OR;  Service: Orthopedics;  Laterality: Left;   Social History   Occupational History  . Not on file  Tobacco Use  . Smoking status: Never Smoker  . Smokeless tobacco: Never Used  Substance and Sexual Activity  . Alcohol use: Yes  . Drug use: No  . Sexual activity: Not on file

## 2018-04-04 ENCOUNTER — Ambulatory Visit (INDEPENDENT_AMBULATORY_CARE_PROVIDER_SITE_OTHER): Payer: PRIVATE HEALTH INSURANCE | Admitting: Orthopedic Surgery

## 2018-04-04 ENCOUNTER — Encounter (INDEPENDENT_AMBULATORY_CARE_PROVIDER_SITE_OTHER): Payer: Self-pay | Admitting: Orthopedic Surgery

## 2018-04-04 VITALS — Ht 77.0 in | Wt 221.0 lb

## 2018-04-04 DIAGNOSIS — L97511 Non-pressure chronic ulcer of other part of right foot limited to breakdown of skin: Secondary | ICD-10-CM

## 2018-04-04 DIAGNOSIS — Z89612 Acquired absence of left leg above knee: Secondary | ICD-10-CM

## 2018-04-04 NOTE — Progress Notes (Signed)
Office Visit Note   Patient: Aaron Mcconnell           Date of Birth: Apr 14, 1976           MRN: [id number] Visit Date: 04/04/2018              Requested by: No referring provider defined for this encounter. PCP: Patient, No Pcp Per  Chief Complaint  Patient presents with  . Left Leg - Routine Post Op    01/23/18 left AKA  . Right Foot - Wound Check      HPI: Patient is a 42 year old gentleman 2 months status post left above-the-knee amputation.  Patient complains of dermatitis on the residual limb he denies any drainage.  Patient also has some neuropathic ulcers on the right foot he is status post a bone scan.  Assessment & Plan: Visit Diagnoses:  1. Hx of AKA (above knee amputation), left (HCC)   2. Non-pressure chronic ulcer of other part of right foot limited to breakdown of skin (HCC)     Plan: Use Band-Aids on the ulcers on the right foot.  Patient was given a vive stump shrinker to be worn against the skin on the left to help decrease the dermatitis.  He will wear this under the silicone liner to minimize dermatitis.  Follow-Up Instructions: Return in about 1 month (around 05/02/2018).   Ortho Exam  Patient is alert, oriented, no adenopathy, well-dressed, normal affect, normal respiratory effort. Examination patient has a dermatitis on the residual limb.  There is no cellulitis no odor no drainage no signs of infection.  The ulcers of the right foot are superficial about 3 mm in diameter with superficial epithelialization.  Bone scan is reviewed from [place] on the right foot shows no evidence of osteomyelitis.  Imaging: No results found. No images are attached to the encounter.  Labs: Lab Results  Component Value Date   REPTSTATUS 01/10/2018 FINAL 01/05/2018   GRAMSTAIN  01/05/2018    MODERATE WBC PRESENT, PREDOMINANTLY PMN FEW GRAM POSITIVE COCCI IN PAIRS FEW GRAM POSITIVE RODS RARE GRAM NEGATIVE RODS Performed at [facility], [address], [place], [place] [address]    CULT  01/05/2018    RARE ALCALIGENES FAECALIS MIXED ANAEROBIC FLORA PRESENT.  CALL LAB IF FURTHER IID REQUIRED.    LABORGA ALCALIGENES FAECALIS 01/05/2018     Lab Results  Component Value Date   ALBUMIN 2.7 (L) 01/18/2018   ALBUMIN 1.9 (L) 01/06/2018   ALBUMIN 2.0 (L) 01/05/2018    Body mass index is 26.21 kg/m.  Orders:  No orders of the defined types were placed in this encounter.  No orders of the defined types were placed in this encounter.    Procedures: No procedures performed  Clinical Data: No additional findings.  ROS:  All other systems negative, except as noted in the HPI. Review of Systems  Objective: Vital Signs: Ht 6\' 5"  (1.956 m)   Wt 221 lb (100.2 kg)   BMI 26.21 kg/m   Specialty Comments:  No specialty comments available.  PMFS History: Patient Active Problem List   Diagnosis Date Noted  . Peripheral neuropathy 01/14/2018  . Chronic alcohol abuse 01/14/2018  . Necrotizing fasciitis of lower leg (HCC)   . Sepsis (HCC) 01/05/2018  . Osteomyelitis of ankle or foot, acute, unspecified laterality (HCC) 01/05/2018   History reviewed. No pertinent past medical history.  Family History  Problem Relation Age of Onset  . Diabetes Mellitus II Mother  Past Surgical History:  Procedure Laterality Date  . AMPUTATION Left 01/18/2018   Procedure: AMPUTATION BELOW KNEE LEFT;  Surgeon: Nadara Mustarduda, Marcus V, MD;  [facility];  Service: Orthopedics;  Laterality: Left;  . AMPUTATION Left 01/23/2018   Procedure: LEFT ABOVE KNEE AMPUTATION;  Surgeon: Nadara Mustarduda, Marcus V, MD;  [facility];  Service: Orthopedics;  Laterality: Left;  . APPLICATION OF WOUND VAC Left 01/07/2018   Procedure: APPLICATION OF WOUND VAC;  Surgeon: Roby LoftsHaddix, Kevin P, MD;  [facility];  Service: Orthopedics;  Laterality: Left;  . APPLICATION OF WOUND VAC Left 01/09/2018   Procedure: APPLICATION OF WOUND VAC;  Surgeon: Nadara Mustarduda, Marcus V, MD;  [facility];  Service: Orthopedics;  Laterality: Left;  . I&D EXTREMITY Left 01/05/2018   Procedure: IRRIGATION AND DEBRIDEMENT EXTREMITY;  Surgeon: Roby LoftsHaddix, Kevin P, MD;  [facility];  Service: Orthopedics;  Laterality: Left;  . I&D EXTREMITY Left 01/07/2018   Procedure: IRRIGATION AND DEBRIDEMENT EXTREMITY;  Surgeon: Roby LoftsHaddix, Kevin P, MD;  [facility];  Service: Orthopedics;  Laterality: Left;  . I&D EXTREMITY Left 01/09/2018   Procedure: IRRIGATION AND DEBRIDEMENT LEFT LEG;  Surgeon: Nadara Mustarduda, Marcus V, MD;  [facility];  Service: Orthopedics;  Laterality: Left;  . I&D EXTREMITY Left 01/11/2018   Procedure: REPEAT IRRIGATION AND DEBRIDEMENT LEFT LEG, APPLY WOUND VAC;  Surgeon: Nadara Mustarduda, Marcus V, MD;  [facility];  Service: Orthopedics;  Laterality: Left;  . I&D EXTREMITY Left 01/16/2018   Procedure: REPEAT IRRIGATION AND DEBRIDEMENT LEFT LEG, APPLY WOUND VAC;  Surgeon: Nadara Mustarduda, Marcus V, MD;  [facility];  Service: Orthopedics;  Laterality: Left;   Social History   Occupational History  . Not on file  Tobacco Use  . Smoking status: Never Smoker  . Smokeless tobacco: Never Used  Substance and Sexual Activity  . Alcohol use: Yes  . Drug use: No  . Sexual activity: Not on file

## 2018-04-22 ENCOUNTER — Other Ambulatory Visit (INDEPENDENT_AMBULATORY_CARE_PROVIDER_SITE_OTHER): Payer: Self-pay | Admitting: Orthopedic Surgery

## 2018-04-22 ENCOUNTER — Telehealth (INDEPENDENT_AMBULATORY_CARE_PROVIDER_SITE_OTHER): Payer: Self-pay | Admitting: Orthopedic Surgery

## 2018-04-22 NOTE — Telephone Encounter (Signed)
Patient's mother Olegario Messier(Kathy) called advised patient has started (PT). She said patient is wearing the stump swinker under his prosthetic leg and it's red and angry per the physical therapist. Olegario MessierKathy asked if she can get clarification on when he should wear the stump swinker. The number to contact Olegario MessierKathy is [phone]

## 2018-04-23 NOTE — Telephone Encounter (Signed)
Pt is asking if they should be wearing shrinker under prosthetic??

## 2018-04-23 NOTE — Telephone Encounter (Signed)
Patient is to try to wear the shrinker to decrease the dermatitis on the leg, wear as much as possible until the dermatitis resolves

## 2018-04-24 NOTE — Telephone Encounter (Signed)
I spoke with patient's mother and advised of Dr. Audrie Liauda's response to wear as much as possible.  Mom is keeping an eye on things and will call if there are any changes to patient's stump.

## 2018-04-24 NOTE — Telephone Encounter (Signed)
n

## 2018-04-26 ENCOUNTER — Telehealth (INDEPENDENT_AMBULATORY_CARE_PROVIDER_SITE_OTHER): Payer: Self-pay

## 2018-04-26 NOTE — Telephone Encounter (Signed)
FYI only:   Sonia, the Tree surgeonprogram director for [facility] called to let us know they will see the patient today,initial visit (was referred there by another practitioner). He will be evaluated by the doctor there today and Jetta LoutRebecca Yates will be his nurse. He has a followup appt here with Dr. Lajoyce Cornersuda 05/02/18.   If any questions/concerns, she can be reached at [phone].

## 2018-04-29 ENCOUNTER — Other Ambulatory Visit (INDEPENDENT_AMBULATORY_CARE_PROVIDER_SITE_OTHER): Payer: Self-pay | Admitting: Orthopedic Surgery

## 2018-04-29 NOTE — Telephone Encounter (Signed)
noted 

## 2018-05-02 ENCOUNTER — Ambulatory Visit (INDEPENDENT_AMBULATORY_CARE_PROVIDER_SITE_OTHER): Payer: PRIVATE HEALTH INSURANCE | Admitting: Orthopedic Surgery

## 2018-05-02 ENCOUNTER — Encounter (INDEPENDENT_AMBULATORY_CARE_PROVIDER_SITE_OTHER): Payer: Self-pay | Admitting: Orthopedic Surgery

## 2018-05-02 VITALS — Ht 77.0 in | Wt 221.0 lb

## 2018-05-02 DIAGNOSIS — Z89612 Acquired absence of left leg above knee: Secondary | ICD-10-CM

## 2018-05-02 DIAGNOSIS — E43 Unspecified severe protein-calorie malnutrition: Secondary | ICD-10-CM | POA: Diagnosis not present

## 2018-05-02 NOTE — Progress Notes (Signed)
Office Visit Note   Patient: Aaron Mcconnell           Date of Birth: 1976/05/08           MRN: [id number] Visit Date: 05/02/2018              Requested by: No referring provider defined for this encounter. PCP: Aggie Cosier, MD  Chief Complaint  Patient presents with  . Left Leg - Routine Post Op    01/23/18 left above the knee amputation       HPI: Patient is a 42 year old gentleman status post above-knee amputation the left.  Patient has had a dermatitis which has shown improvement.  Patient was starting gait training and this was held due to his dermatitis of the residual limb.  Patient did go to a wound care clinic and the compression stocking was recommended to be discontinued and Hydrofera Blue was recommended for wound care treatment.  Assessment & Plan: Visit Diagnoses:  1. Hx of AKA (above knee amputation), left (HCC)     Plan: Discussed with the patient that his dermatitis does show improvement.  I feel the dermatitis is due to drainage and patient has the option to either continue the [facility] dressing changes or to try using the medical compression stocking to help absorb the drainage.  Patient and his mother state they would like to try the medical compression stocking.  Discussed that if the dermatitis does worsen that I would change the dressing option.  Discussed that there are a lot of dressing options and that there is not one dressing option that is better than the other.  The primary focus at this time is to absorb drainage.  Follow-Up Instructions: Return in about 1 month (around 05/30/2018).   Ortho Exam  Patient is alert, oriented, no adenopathy, well-dressed, normal affect, normal respiratory effort. Examination patient has good hair growth inferiorly on the posterior flap there is some dermatitis with a small area of hyper granulation tissue this was touched with silver nitrate.  The distal femur and skin over the distal femur are not tender to  palpation there is no redness the irritated skin posteriorly seems to be due to clear draining fluid there is no purulence no abscess no tenderness to palpation no clinical signs of infection.  I recommended that he discontinue his antibiotic at this time.  With the distal femur being nontender and the skin over this looking good I do not feel that MRI scan would provide useful information.  Discussed that with surgery there will be edema in the bone and this may be provide confounding information.  Imaging: No results found. No images are attached to the encounter.  Labs: Lab Results  Component Value Date   REPTSTATUS 01/10/2018 FINAL 01/05/2018   GRAMSTAIN  01/05/2018    MODERATE WBC PRESENT, PREDOMINANTLY PMN FEW GRAM POSITIVE COCCI IN PAIRS FEW GRAM POSITIVE RODS RARE GRAM NEGATIVE RODS Performed at [facility], [address], [place], [place] [address]    CULT  01/05/2018    RARE ALCALIGENES FAECALIS MIXED ANAEROBIC FLORA PRESENT.  CALL LAB IF FURTHER IID REQUIRED.    LABORGA ALCALIGENES FAECALIS 01/05/2018     Lab Results  Component Value Date   ALBUMIN 2.7 (L) 01/18/2018   ALBUMIN 1.9 (L) 01/06/2018   ALBUMIN 2.0 (L) 01/05/2018    Body mass index is 26.21 kg/m.  Orders:  No orders of the defined types were placed in this encounter.  No  orders of the defined types were placed in this encounter.    Procedures: No procedures performed  Clinical Data: No additional findings.  ROS:  All other systems negative, except as noted in the HPI. Review of Systems  Objective: Vital Signs: Ht 6\' 5"  (1.956 m)   Wt 221 lb (100.2 kg)   BMI 26.21 kg/m   Specialty Comments:  No specialty comments available.  PMFS History: Patient Active Problem List   Diagnosis Date Noted  . Peripheral neuropathy 01/14/2018  . Chronic alcohol abuse 01/14/2018  . Necrotizing fasciitis of lower leg (HCC)   . Sepsis (HCC) 01/05/2018  . Osteomyelitis of ankle or foot, acute,  unspecified laterality (HCC) 01/05/2018   History reviewed. No pertinent past medical history.  Family History  Problem Relation Age of Onset  . Diabetes Mellitus II Mother     Past Surgical History:  Procedure Laterality Date  . AMPUTATION Left 01/18/2018   Procedure: AMPUTATION BELOW KNEE LEFT;  Surgeon: Nadara Mustarduda, Leonore Frankson V, MD;  [facility];  Service: Orthopedics;  Laterality: Left;  . AMPUTATION Left 01/23/2018   Procedure: LEFT ABOVE KNEE AMPUTATION;  Surgeon: Nadara Mustarduda, Bleu Moisan V, MD;  [facility];  Service: Orthopedics;  Laterality: Left;  . APPLICATION OF WOUND VAC Left 01/07/2018   Procedure: APPLICATION OF WOUND VAC;  Surgeon: Roby LoftsHaddix, Kevin P, MD;  [facility];  Service: Orthopedics;  Laterality: Left;  . APPLICATION OF WOUND VAC Left 01/09/2018   Procedure: APPLICATION OF WOUND VAC;  Surgeon: Nadara Mustarduda, Kyndel Egger V, MD;  [facility];  Service: Orthopedics;  Laterality: Left;  . I&D EXTREMITY Left 01/05/2018   Procedure: IRRIGATION AND DEBRIDEMENT EXTREMITY;  Surgeon: Roby LoftsHaddix, Kevin P, MD;  [facility];  Service: Orthopedics;  Laterality: Left;  . I&D EXTREMITY Left 01/07/2018   Procedure: IRRIGATION AND DEBRIDEMENT EXTREMITY;  Surgeon: Roby LoftsHaddix, Kevin P, MD;  [facility];  Service: Orthopedics;  Laterality: Left;  . I&D EXTREMITY Left 01/09/2018   Procedure: IRRIGATION AND DEBRIDEMENT LEFT LEG;  Surgeon: Nadara Mustarduda, Mialani Reicks V, MD;  [facility];  Service: Orthopedics;  Laterality: Left;  . I&D EXTREMITY Left 01/11/2018   Procedure: REPEAT IRRIGATION AND DEBRIDEMENT LEFT LEG, APPLY WOUND VAC;  Surgeon: Nadara Mustarduda, Wanya Bangura V, MD;  [facility];  Service: Orthopedics;  Laterality: Left;  . I&D EXTREMITY Left 01/16/2018   Procedure: REPEAT IRRIGATION AND DEBRIDEMENT LEFT LEG, APPLY WOUND VAC;  Surgeon: Nadara Mustarduda, Kaesen Rodriguez V, MD;  [facility]: [facility] OR;  Service: Orthopedics;  Laterality: Left;   Social History   Occupational History  . Not on file  Tobacco Use  . Smoking status: Never Smoker  . Smokeless  tobacco: Never Used  Substance and Sexual Activity  . Alcohol use: Yes  . Drug use: No  . Sexual activity: Not on file

## 2018-05-16 ENCOUNTER — Encounter (INDEPENDENT_AMBULATORY_CARE_PROVIDER_SITE_OTHER): Payer: Self-pay | Admitting: Orthopedic Surgery

## 2018-05-16 ENCOUNTER — Ambulatory Visit (INDEPENDENT_AMBULATORY_CARE_PROVIDER_SITE_OTHER): Payer: PRIVATE HEALTH INSURANCE | Admitting: Orthopedic Surgery

## 2018-05-16 DIAGNOSIS — Z89612 Acquired absence of left leg above knee: Secondary | ICD-10-CM | POA: Diagnosis not present

## 2018-05-16 NOTE — Progress Notes (Signed)
Office Visit Note   Patient: Aaron Mcconnell           Date of Birth: 01/24/1976           MRN: [id number] Visit Date: 05/16/2018              Requested by: Aggie Cosieresai, Balaji, Harriet PhoMD [facility] [address] [place], [place] [id number] PCP: Aggie Cosieresai, Balaji, MD  Chief Complaint  Patient presents with  . Left Knee - Routine Post Op      HPI: Patient is a 42 year old gentleman is seen in follow-up for left above-knee amputation.  Patient is currently ambulating with a walker he does have a well fitting prosthesis.  Patient states the dermatitis is improving.  Assessment & Plan: Visit Diagnoses:  1. Hx of AKA (above knee amputation), left (HCC)     Plan: Recommended patient continue with his current care use the medical compression stump shrinker to help decrease the dermatitis he may also try using some Shea butter at night.  Recommend against using a cortisone cream this may cause soft tissue atrophy.  Follow-Up Instructions: Return if symptoms worsen or fail to improve.   Ortho Exam  Patient is alert, oriented, no adenopathy, well-dressed, normal affect, normal respiratory effort. Examination patient continues to have improvement of the residual limb on the left.  There is no redness or tenderness over the residual femur.  The dermatitis is improving there are no open wounds no drainage no cellulitis no signs of infection.  He has good hair growth.  There is no tenderness to palpation.  Imaging: No results found. No images are attached to the encounter.  Labs: Lab Results  Component Value Date   REPTSTATUS 01/10/2018 FINAL 01/05/2018   GRAMSTAIN  01/05/2018    MODERATE WBC PRESENT, PREDOMINANTLY PMN FEW GRAM POSITIVE COCCI IN PAIRS FEW GRAM POSITIVE RODS RARE GRAM NEGATIVE RODS Performed at [facility], [address] [place], [place] [address]    CULT  01/05/2018    RARE ALCALIGENES FAECALIS MIXED ANAEROBIC FLORA PRESENT.  CALL LAB IF FURTHER IID  REQUIRED.    LABORGA ALCALIGENES FAECALIS 01/05/2018     Lab Results  Component Value Date   ALBUMIN 2.7 (L) 01/18/2018   ALBUMIN 1.9 (L) 01/06/2018   ALBUMIN 2.0 (L) 01/05/2018    There is no height or weight on file to calculate BMI.  Orders:  No orders of the defined types were placed in this encounter.  No orders of the defined types were placed in this encounter.    Procedures: No procedures performed  Clinical Data: No additional findings.  ROS:  All other systems negative, except as noted in the HPI. Review of Systems  Objective: Vital Signs: There were no vitals taken for this visit.  Specialty Comments:  No specialty comments available.  PMFS History: Patient Active Problem List   Diagnosis Date Noted  . Peripheral neuropathy 01/14/2018  . Chronic alcohol abuse 01/14/2018  . Necrotizing fasciitis of lower leg (HCC)   . Sepsis (HCC) 01/05/2018  . Osteomyelitis of ankle or foot, acute, unspecified laterality (HCC) 01/05/2018   History reviewed. No pertinent past medical history.  Family History  Problem Relation Age of Onset  . Diabetes Mellitus II Mother     Past Surgical History:  Procedure Laterality Date  . AMPUTATION Left 01/18/2018   Procedure: AMPUTATION BELOW KNEE LEFT;  Surgeon: Nadara Mustarduda, Ammara Raj V, MD;  [facility];  Service: Orthopedics;  Laterality: Left;  . AMPUTATION  Left 01/23/2018   Procedure: LEFT ABOVE KNEE AMPUTATION;  Surgeon: Nadara Mustard, MD;  Location: [facility] OR;  Service: Orthopedics;  Laterality: Left;  . APPLICATION OF WOUND VAC Left 01/07/2018   Procedure: APPLICATION OF WOUND VAC;  Surgeon: Roby Lofts, MD;  Location: [facility] OR;  Service: Orthopedics;  Laterality: Left;  . APPLICATION OF WOUND VAC Left 01/09/2018   Procedure: APPLICATION OF WOUND VAC;  Surgeon: Nadara Mustard, MD;  Location: [facility] OR;  Service: Orthopedics;  Laterality: Left;  . I&D EXTREMITY Left 01/05/2018   Procedure: IRRIGATION AND DEBRIDEMENT EXTREMITY;   Surgeon: Roby Lofts, MD;  Location: [facility] OR;  Service: Orthopedics;  Laterality: Left;  . I&D EXTREMITY Left 01/07/2018   Procedure: IRRIGATION AND DEBRIDEMENT EXTREMITY;  Surgeon: Roby Lofts, MD;  [facility] OR;  Service: Orthopedics;  Laterality: Left;  . I&D EXTREMITY Left 01/09/2018   Procedure: IRRIGATION AND DEBRIDEMENT LEFT LEG;  Surgeon: Nadara Mustard, MD;  Location: [facility] OR;  Service: Orthopedics;  Laterality: Left;  . I&D EXTREMITY Left 01/11/2018   Procedure: REPEAT IRRIGATION AND DEBRIDEMENT LEFT LEG, APPLY WOUND VAC;  Surgeon: Nadara Mustard, MD;  [facility]: [facility] OR;  Service: Orthopedics;  Laterality: Left;  . I&D EXTREMITY Left 01/16/2018   Procedure: REPEAT IRRIGATION AND DEBRIDEMENT LEFT LEG, APPLY WOUND VAC;  Surgeon: Nadara Mustard, MD;  [facility] OR;  Service: Orthopedics;  Laterality: Left;   Social History   Occupational History  . Not on file  Tobacco Use  . Smoking status: Never Smoker  . Smokeless tobacco: Never Used  Substance and Sexual Activity  . Alcohol use: Yes  . Drug use: No  . Sexual activity: Not on file

## 2018-05-20 ENCOUNTER — Ambulatory Visit (INDEPENDENT_AMBULATORY_CARE_PROVIDER_SITE_OTHER): Payer: PRIVATE HEALTH INSURANCE | Admitting: Orthopedic Surgery

## 2018-05-22 ENCOUNTER — Other Ambulatory Visit (INDEPENDENT_AMBULATORY_CARE_PROVIDER_SITE_OTHER): Payer: Self-pay | Admitting: Orthopedic Surgery

## 2018-06-02 ENCOUNTER — Encounter (INDEPENDENT_AMBULATORY_CARE_PROVIDER_SITE_OTHER): Payer: Self-pay | Admitting: Orthopedic Surgery

## 2018-06-04 ENCOUNTER — Telehealth (INDEPENDENT_AMBULATORY_CARE_PROVIDER_SITE_OTHER): Payer: Self-pay

## 2018-06-04 NOTE — Telephone Encounter (Signed)
Mom called for pt. Pt hit stump on the side of his desk at work yesterday. Was red and irritated last night. Pt went for therapy today and therapist is telling her it is bruised and needs to be looked at. I scheduled him for Thursday morning but she wants to know if she should bring him in tomorrow and let someone else look at it to be safe?

## 2018-06-04 NOTE — Telephone Encounter (Signed)
I called and sw pt's mother. He does not report any pain and no signs of open area. Advised if it hurts to wear his prosthetic then he should not do this but if is ok to wait until Thursday and less anything further develops. To call with questions.

## 2018-06-06 ENCOUNTER — Encounter (INDEPENDENT_AMBULATORY_CARE_PROVIDER_SITE_OTHER): Payer: Self-pay | Admitting: Orthopedic Surgery

## 2018-06-06 ENCOUNTER — Ambulatory Visit (INDEPENDENT_AMBULATORY_CARE_PROVIDER_SITE_OTHER): Payer: PRIVATE HEALTH INSURANCE | Admitting: Orthopedic Surgery

## 2018-06-06 DIAGNOSIS — Z89612 Acquired absence of left leg above knee: Secondary | ICD-10-CM

## 2018-06-06 NOTE — Progress Notes (Signed)
Office Visit Note   Patient: Aaron Mcconnell           Date of Birth: 06/07/1976           MRN: [id number] Visit Date: 06/06/2018              Requested by: Aggie Cosieresai, Balaji, MD [facility] [address] [place], [place] [id number] PCP: Aggie Cosieresai, Balaji, MD  Chief Complaint  Patient presents with  . Left Leg - Injury, Wound Check      HPI: Patient is a 42 year old gentleman left above-the-knee amputation.  Patient states that 2 days ago he stumbled with using his crutches the low artificial leg locked at 45 degrees and he bruised the inferior aspect of the residual limb.  Patient states his been using antibiotic ointment.  His therapy has been on hold.  Assessment & Plan: Visit Diagnoses:  1. Hx of AKA (above knee amputation), left (HCC)     Plan: Recommended that he resume physical therapy without restrictions.  Patient will use a cortisone cream that he has for the dermatitis.  Continue with moisturizing lotion continue with the stump shrinker.  Follow-Up Instructions: Return if symptoms worsen or fail to improve.   Ortho Exam  Patient is alert, oriented, no adenopathy, well-dressed, normal affect, normal respiratory effort. Examination patient has dermatitis on the residual limb there is no cellulitis no open wound no drainage no signs of infection.  Imaging: No results found. No images are attached to the encounter.  Labs: Lab Results  Component Value Date   REPTSTATUS 01/10/2018 FINAL 01/05/2018   GRAMSTAIN  01/05/2018    MODERATE WBC PRESENT, PREDOMINANTLY PMN FEW GRAM POSITIVE COCCI IN PAIRS FEW GRAM POSITIVE RODS RARE GRAM NEGATIVE RODS Performed at [facility], [address] [place], [place] [address]    CULT  01/05/2018    RARE ALCALIGENES FAECALIS MIXED ANAEROBIC FLORA PRESENT.  CALL LAB IF FURTHER IID REQUIRED.    LABORGA ALCALIGENES FAECALIS 01/05/2018     Lab Results  Component Value Date   ALBUMIN 2.7 (L) 01/18/2018   ALBUMIN 1.9 (L) 01/06/2018   ALBUMIN 2.0 (L) 01/05/2018    There is no height or weight on file to calculate BMI.  Orders:  No orders of the defined types were placed in this encounter.  No orders of the defined types were placed in this encounter.    Procedures: No procedures performed  Clinical Data: No additional findings.  ROS:  All other systems negative, except as noted in the HPI. Review of Systems  Objective: Vital Signs: There were no vitals taken for this visit.  Specialty Comments:  No specialty comments available.  PMFS History: Patient Active Problem List   Diagnosis Date Noted  . Peripheral neuropathy 01/14/2018  . Chronic alcohol abuse 01/14/2018  . Necrotizing fasciitis of lower leg (HCC)   . Sepsis (HCC) 01/05/2018  . Osteomyelitis of ankle or foot, acute, unspecified laterality (HCC) 01/05/2018   History reviewed. No pertinent past medical history.  Family History  Problem Relation Age of Onset  . Diabetes Mellitus II Mother     Past Surgical History:  Procedure Laterality Date  . AMPUTATION Left 01/18/2018   Procedure: AMPUTATION BELOW KNEE LEFT;  Surgeon: Nadara Mustarduda, Althia Egolf V, MD;  [facility];  Service: Orthopedics;  Laterality: Left;  . AMPUTATION Left 01/23/2018   Procedure: LEFT ABOVE KNEE AMPUTATION;  Surgeon: Nadara Mustarduda, Elianah Karis V, MD;  [facility];  Service: Orthopedics;  Laterality: Left;  .  APPLICATION OF WOUND VAC Left 01/07/2018   Procedure: APPLICATION OF WOUND VAC;  Surgeon: Roby LoftsHaddix, Kevin P, MD;  [facility];  Service: Orthopedics;  Laterality: Left;  . APPLICATION OF WOUND VAC Left 01/09/2018   Procedure: APPLICATION OF WOUND VAC;  Surgeon: Nadara Mustarduda, Collier Monica V, MD;  [facility];  Service: Orthopedics;  Laterality: Left;  . I&D EXTREMITY Left 01/05/2018   Procedure: IRRIGATION AND DEBRIDEMENT EXTREMITY;  Surgeon: Roby LoftsHaddix, Kevin P, MD;  [facility];  Service: Orthopedics;  Laterality: Left;  . I&D EXTREMITY Left 01/07/2018   Procedure:  IRRIGATION AND DEBRIDEMENT EXTREMITY;  Surgeon: Roby LoftsHaddix, Kevin P, MD;  [facility];  Service: Orthopedics;  Laterality: Left;  . I&D EXTREMITY Left 01/09/2018   Procedure: IRRIGATION AND DEBRIDEMENT LEFT LEG;  Surgeon: Nadara Mustarduda, Smayan Hackbart V, MD;  [facility];  Service: Orthopedics;  Laterality: Left;  . I&D EXTREMITY Left 01/11/2018   Procedure: REPEAT IRRIGATION AND DEBRIDEMENT LEFT LEG, APPLY WOUND VAC;  Surgeon: Nadara Mustarduda, Sevilla Murtagh V, MD;  [facility];  Service: Orthopedics;  Laterality: Left;  . I&D EXTREMITY Left 01/16/2018   Procedure: REPEAT IRRIGATION AND DEBRIDEMENT LEFT LEG, APPLY WOUND VAC;  Surgeon: Nadara Mustarduda, Idora Brosious V, MD;  [facility]: [facility] OR;  Service: Orthopedics;  Laterality: Left;   Social History   Occupational History  . Not on file  Tobacco Use  . Smoking status: Never Smoker  . Smokeless tobacco: Never Used  Substance and Sexual Activity  . Alcohol use: Yes  . Drug use: No  . Sexual activity: Not on file

## 2019-02-20 ENCOUNTER — Encounter (INDEPENDENT_AMBULATORY_CARE_PROVIDER_SITE_OTHER): Payer: Self-pay | Admitting: Orthopedic Surgery

## 2019-02-21 ENCOUNTER — Telehealth (INDEPENDENT_AMBULATORY_CARE_PROVIDER_SITE_OTHER): Payer: Self-pay

## 2019-02-21 NOTE — Telephone Encounter (Signed)
I called patient to try and get him scheduled to come in to see Dr Lajoyce Corners for an follow-up appointment but lvm to call back. Patient left message concerning if he needs to be seen and last OV was 06/06/18 for left leg AKA.

## 2019-03-07 ENCOUNTER — Telehealth (INDEPENDENT_AMBULATORY_CARE_PROVIDER_SITE_OTHER): Payer: Self-pay

## 2019-03-07 NOTE — Telephone Encounter (Signed)
Patient was called today to follow-up with him about a message he has left for [place] in Mychart. I have reached out to him via phone and Mychart but patient has not yet responded. I advised him that it was his decision if he would like to be seen for follow up if he is doing well but if in need of anything then he would have to follow-up in our office since it has been a year since being seen. Thank you

## 2019-08-29 IMAGING — CT CT FOOT*L* W/CM
3 series · 13 of 27 positions shown, 16 images · IV contrast (agent unspecified)
Comparison: Radiographs 01/04/2018

CONTRAST:  75mL [id number] IOPAMIDOL ([id number]-1AA) INJECTION 61%

CLINICAL DATA: Diffuse ankle and foot pain and swelling. Recent
extensive surgical debridement of foot and calf wounds.

EXAM:
CT OF THE LOWER LEFT EXTREMITY WITH CONTRAST
TECHNIQUE: Multidetector CT imaging of the lower left extremity was performed
according to the standard protocol following intravenous contrast
administration.

[Series 7: lower ext 1.5 st · axial · 0.56mm/px · z∈[-1279,-1098]mm · 6 of 171 slices shown, 8 images]
[im 25/171  soft-tissue]
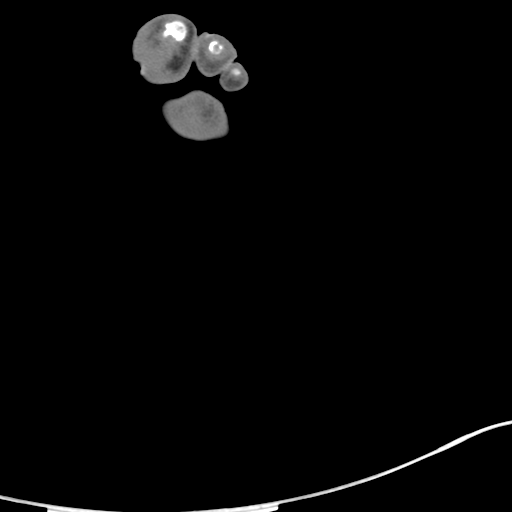
[im 25/171  bone]
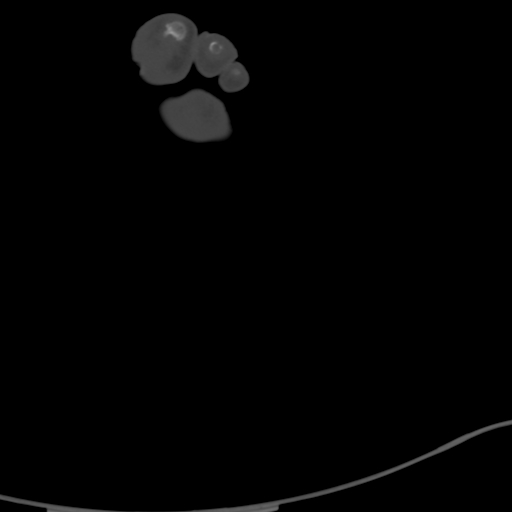
[im 49/171  bone]
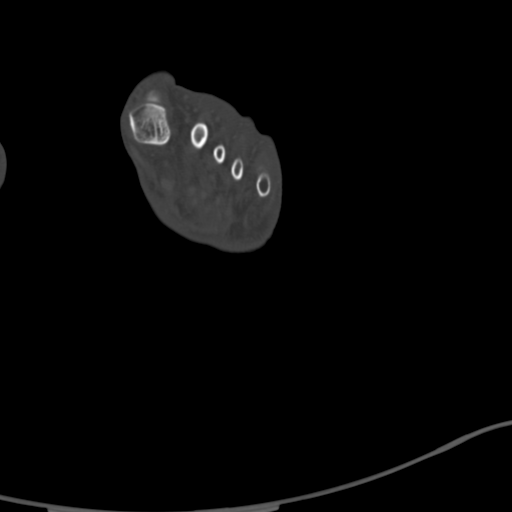
[im 73/171  bone]
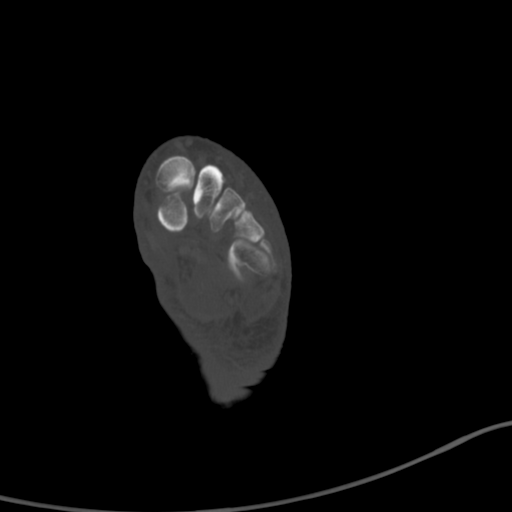
[im 98/171  bone]
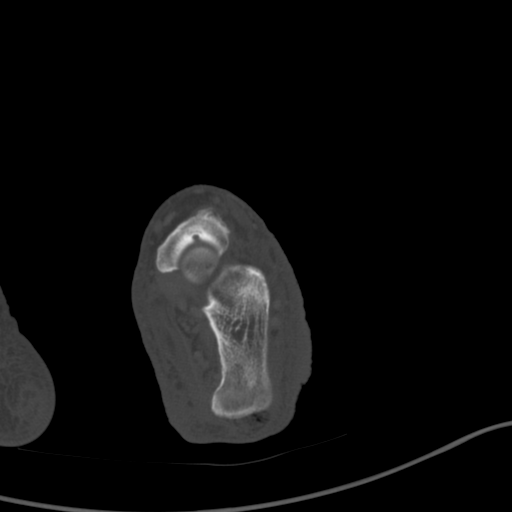
[im 122/171  soft-tissue]
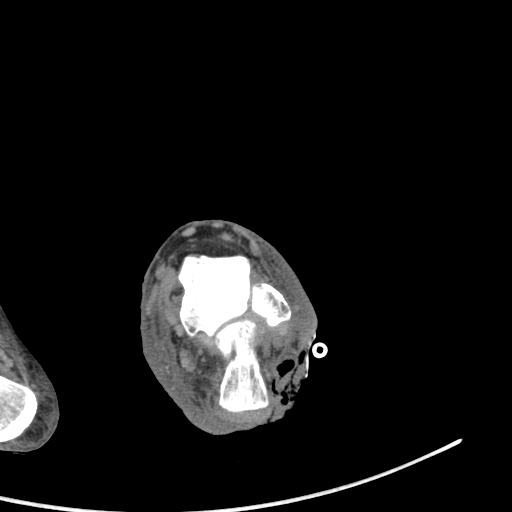
[im 122/171  bone]
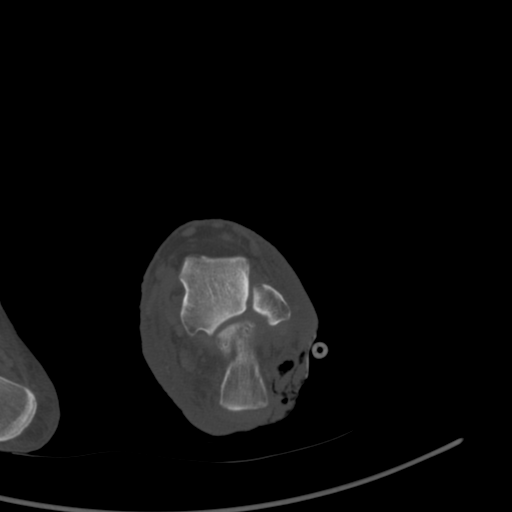
[im 146/171  bone]
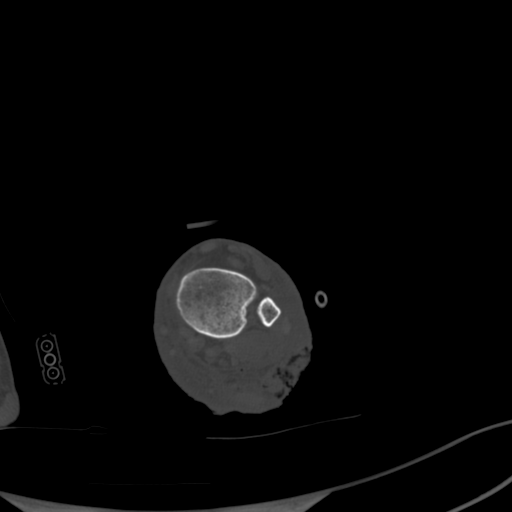

[Series 12: lower ext cor st · axial · 0.37mm/px · z∈[-1137,-1107]mm · 2 of 127 slices shown]
[im 26/127  bone]
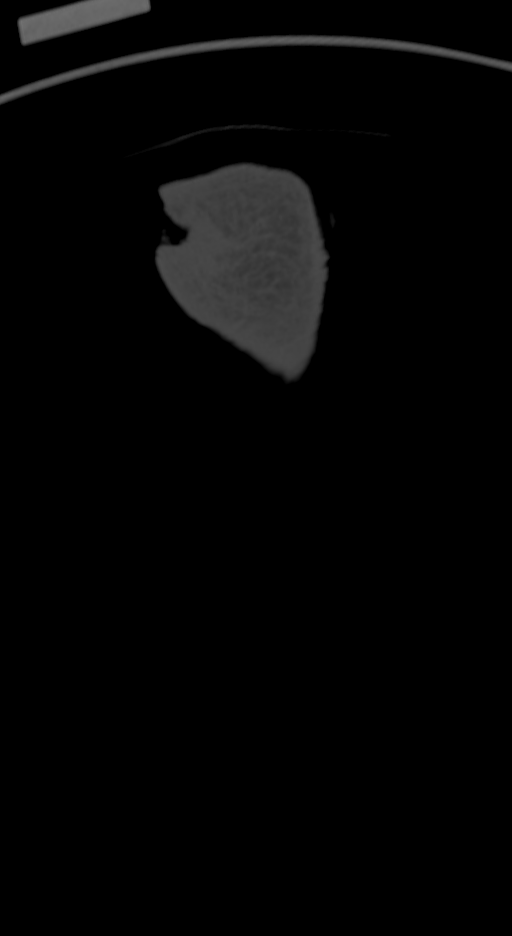
[im 51/127  bone]
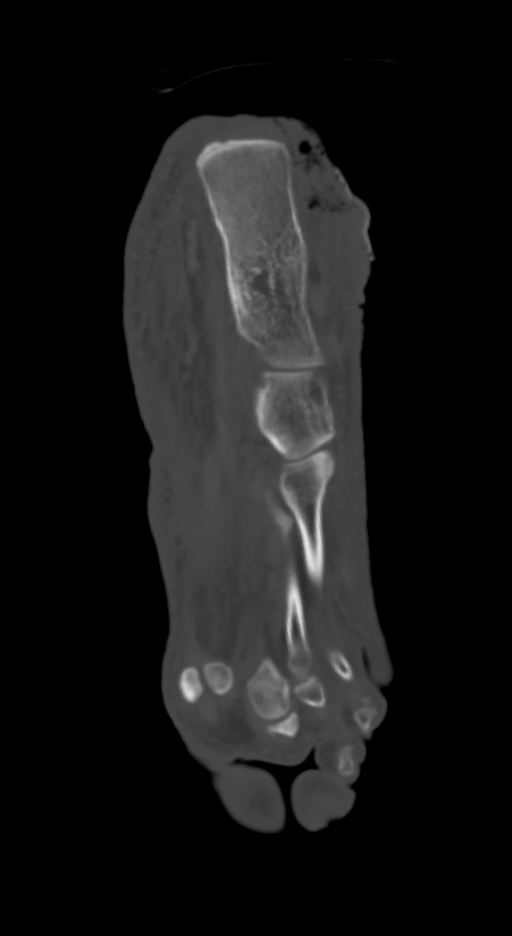

[Series 13: lower ext sag st · sagittal · 0.51mm/px · 5 of 112 slices shown, 6 images]
[im 38/112  bone]
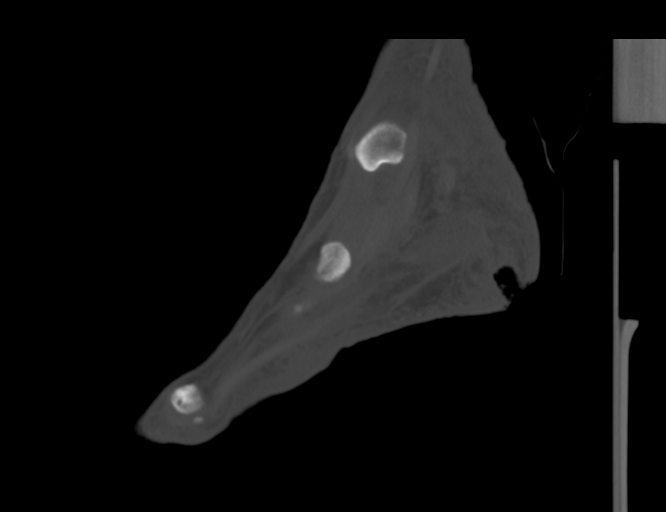
[im 47/112  bone]
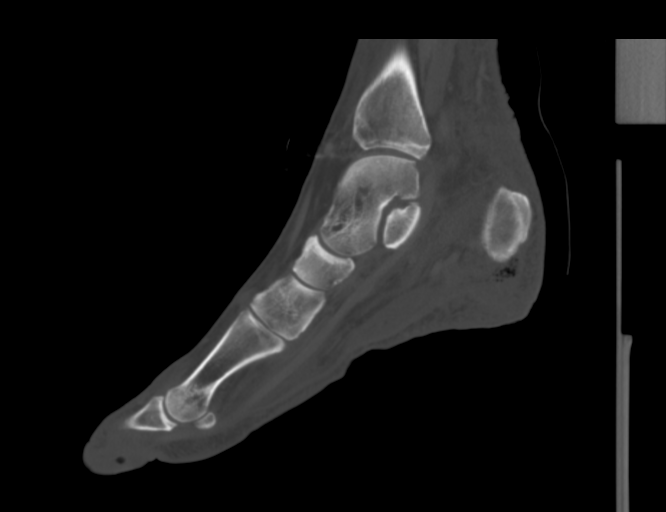
[im 56/112  soft-tissue]
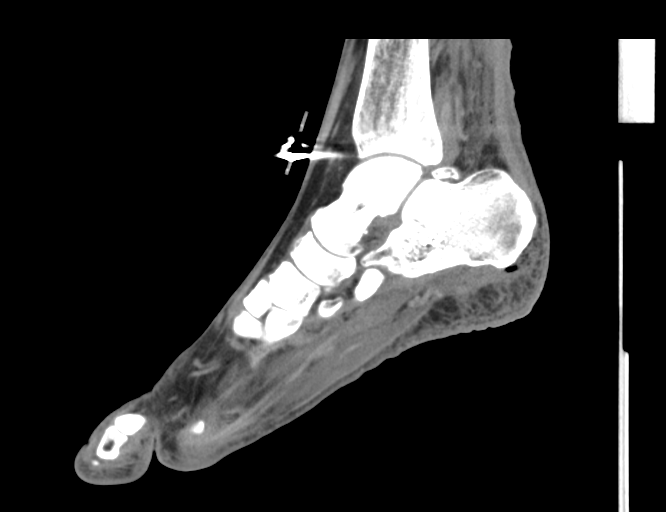
[im 56/112  bone]
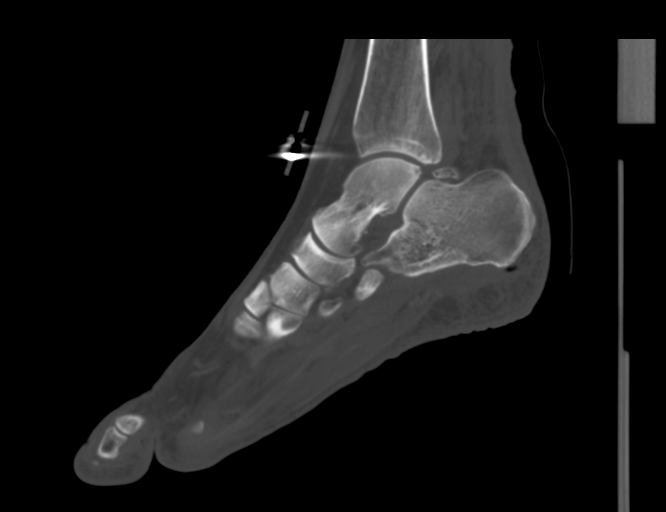
[im 65/112  bone]
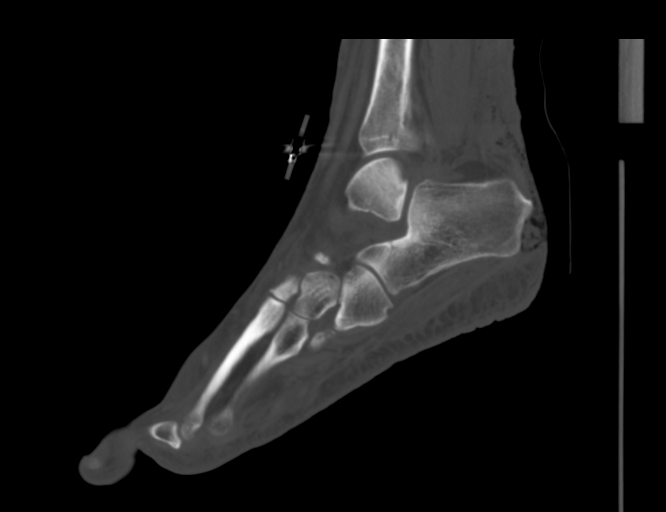
[im 75/112  bone]
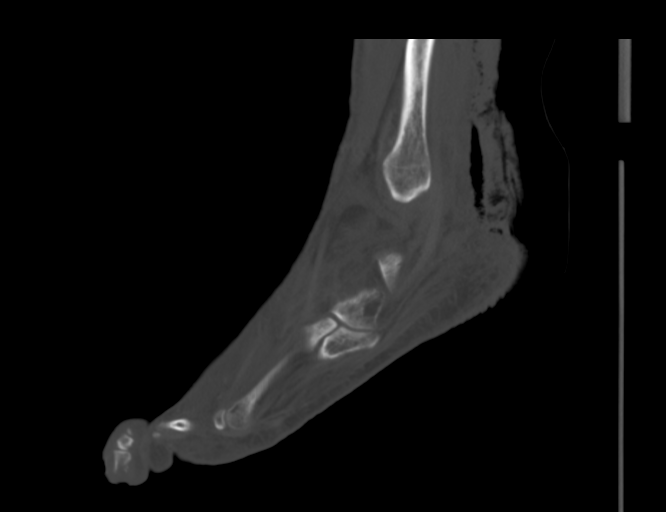

[13 of 27 positions shown; findings below may reference images not displayed]

FINDINGS: Evidence of recent surgical debridement with a large open wound
involving the entire posterior aspect of the heel right down to the
calcaneus. Associated air throughout the soft tissues and a wound
VAC is noted laterally. Diffuse and marked subcutaneous soft tissue
swelling/edema/fluid consistent with severe cellulitis.

Diffuse myofasciitis involving the short flexor muscles. No definite
findings for pyomyositis.

Open wound noted along the medial aspect of the great toe distally
with significant surrounding cellulitis.

I do not see any obvious CT findings to suggest septic arthritis or
osteomyelitis but MRI would be much more sensitive.
IMPRESSION: 1. Diffuse cellulitis and myofasciitis without discrete drainable
soft tissue abscess, pyomyositis, septic arthritis or osteomyelitis.
2. Large open wound along the posterior aspect of the heel with air
in the soft tissues and a wound VAC in place.
3. Small open wound along the medial aspect of the great toe.

## 2022-01-23 ENCOUNTER — Inpatient Hospital Stay (HOSPITAL_COMMUNITY)
Admission: AD | Admit: 2022-01-23 | Discharge: 2022-01-26 | DRG: 393 | Disposition: A | Discharge status: Home / Self Care | Payer: PRIVATE HEALTH INSURANCE | Source: Other Acute Inpatient Hospital | Attending: Family Medicine | Admitting: Family Medicine

## 2022-01-23 ENCOUNTER — Other Ambulatory Visit: Payer: Self-pay

## 2022-01-23 ENCOUNTER — Encounter (HOSPITAL_COMMUNITY): Payer: Self-pay | Admitting: Internal Medicine

## 2022-01-23 DIAGNOSIS — K640 First degree hemorrhoids: Secondary | ICD-10-CM | POA: Diagnosis present

## 2022-01-23 DIAGNOSIS — K635 Polyp of colon: Secondary | ICD-10-CM | POA: Diagnosis present

## 2022-01-23 DIAGNOSIS — K922 Gastrointestinal hemorrhage, unspecified: Secondary | ICD-10-CM | POA: Diagnosis not present

## 2022-01-23 DIAGNOSIS — D62 Acute posthemorrhagic anemia: Secondary | ICD-10-CM | POA: Diagnosis present

## 2022-01-23 DIAGNOSIS — K766 Portal hypertension: Secondary | ICD-10-CM | POA: Diagnosis present

## 2022-01-23 DIAGNOSIS — K529 Noninfective gastroenteritis and colitis, unspecified: Secondary | ICD-10-CM

## 2022-01-23 DIAGNOSIS — K2971 Gastritis, unspecified, with bleeding: Secondary | ICD-10-CM | POA: Diagnosis not present

## 2022-01-23 DIAGNOSIS — Z89512 Acquired absence of left leg below knee: Secondary | ICD-10-CM

## 2022-01-23 DIAGNOSIS — K3189 Other diseases of stomach and duodenum: Secondary | ICD-10-CM | POA: Diagnosis present

## 2022-01-23 DIAGNOSIS — K921 Melena: Secondary | ICD-10-CM | POA: Diagnosis present

## 2022-01-23 DIAGNOSIS — K5731 Diverticulosis of large intestine without perforation or abscess with bleeding: Secondary | ICD-10-CM | POA: Diagnosis present

## 2022-01-23 DIAGNOSIS — I8511 Secondary esophageal varices with bleeding: Secondary | ICD-10-CM | POA: Diagnosis not present

## 2022-01-23 DIAGNOSIS — F1010 Alcohol abuse, uncomplicated: Secondary | ICD-10-CM

## 2022-01-23 DIAGNOSIS — K2970 Gastritis, unspecified, without bleeding: Secondary | ICD-10-CM | POA: Diagnosis present

## 2022-01-23 DIAGNOSIS — Z89612 Acquired absence of left leg above knee: Secondary | ICD-10-CM | POA: Diagnosis not present

## 2022-01-23 DIAGNOSIS — I8510 Secondary esophageal varices without bleeding: Secondary | ICD-10-CM | POA: Diagnosis present

## 2022-01-23 DIAGNOSIS — K7031 Alcoholic cirrhosis of liver with ascites: Secondary | ICD-10-CM

## 2022-01-23 HISTORY — DX: Alcohol abuse, uncomplicated: F10.10

## 2022-01-23 LAB — PROTIME-INR
INR: 1.6 — ABNORMAL HIGH (ref 0.8–1.2)
Prothrombin Time: 19 seconds — ABNORMAL HIGH (ref 11.4–15.2)

## 2022-01-23 LAB — CBC WITH DIFFERENTIAL/PLATELET
Abs Immature Granulocytes: 0.09 10*3/uL — ABNORMAL HIGH (ref 0.00–0.07)
Basophils Absolute: 0.1 10*3/uL (ref 0.0–0.1)
Basophils Relative: 0 %
Eosinophils Absolute: 0 10*3/uL (ref 0.0–0.5)
Eosinophils Relative: 0 %
HCT: 23.5 % — ABNORMAL LOW (ref 39.0–52.0)
Hemoglobin: 7.7 g/dL — ABNORMAL LOW (ref 13.0–17.0)
Immature Granulocytes: 1 %
Lymphocytes Relative: 10 %
Lymphs Abs: 1.6 10*3/uL (ref 0.7–4.0)
MCH: 36.5 pg — ABNORMAL HIGH (ref 26.0–34.0)
MCHC: 32.8 g/dL (ref 30.0–36.0)
MCV: 111.4 fL — ABNORMAL HIGH (ref 80.0–100.0)
Monocytes Absolute: 0.9 10*3/uL (ref 0.1–1.0)
Monocytes Relative: 6 %
Neutro Abs: 12.3 10*3/uL — ABNORMAL HIGH (ref 1.7–7.7)
Neutrophils Relative %: 83 %
Platelets: 227 10*3/uL (ref 150–400)
RBC: 2.11 MIL/uL — ABNORMAL LOW (ref 4.22–5.81)
RDW: 16.5 % — ABNORMAL HIGH (ref 11.5–15.5)
WBC: 14.9 10*3/uL — ABNORMAL HIGH (ref 4.0–10.5)
nRBC: 0 % (ref 0.0–0.2)

## 2022-01-23 LAB — COMPREHENSIVE METABOLIC PANEL
ALT: 18 U/L (ref 0–44)
AST: 55 U/L — ABNORMAL HIGH (ref 15–41)
Albumin: 2.3 g/dL — ABNORMAL LOW (ref 3.5–5.0)
Alkaline Phosphatase: 126 U/L (ref 38–126)
Anion gap: 9 (ref 5–15)
BUN: 11 mg/dL (ref 6–20)
CO2: 27 mmol/L (ref 22–32)
Calcium: 8 mg/dL — ABNORMAL LOW (ref 8.9–10.3)
Chloride: 98 mmol/L (ref 98–111)
Creatinine, Ser: 0.82 mg/dL (ref 0.61–1.24)
GFR, Estimated: >60 mL/min (ref >60)
Glucose, Bld: 159 mg/dL — ABNORMAL HIGH (ref 70–99)
Potassium: 4.4 mmol/L (ref 3.5–5.1)
Sodium: 134 mmol/L — ABNORMAL LOW (ref 135–145)
Total Bilirubin: 2 mg/dL — ABNORMAL HIGH (ref 0.3–1.2)
Total Protein: 6.1 g/dL — ABNORMAL LOW (ref 6.5–8.1)

## 2022-01-23 LAB — HEMOGLOBIN AND HEMATOCRIT, BLOOD
HCT: 21.7 % — ABNORMAL LOW (ref 39.0–52.0)
HCT: 23.3 % — ABNORMAL LOW (ref 39.0–52.0)
HCT: 22.5 % — ABNORMAL LOW (ref 39.0–52.0)
Hemoglobin: 7 g/dL — ABNORMAL LOW (ref 13.0–17.0)
Hemoglobin: 7.7 g/dL — ABNORMAL LOW (ref 13.0–17.0)
Hemoglobin: 7.5 g/dL — ABNORMAL LOW (ref 13.0–17.0)

## 2022-01-23 LAB — LACTIC ACID, PLASMA
Lactic Acid, Venous: 1.6 mmol/L (ref 0.5–1.9)
Lactic Acid, Venous: 1.6 mmol/L (ref 0.5–1.9)

## 2022-01-23 LAB — PREPARE RBC (CROSSMATCH)

## 2022-01-23 LAB — HIV ANTIBODY (ROUTINE TESTING W REFLEX): HIV Screen 4th Generation wRfx: NONREACTIVE

## 2022-01-23 MED ORDER — ADULT MULTIVITAMIN W/MINERALS CH
1.0000 | ORAL_TABLET | Freq: Every day | ORAL | Status: DC
  Administered 2022-01-24 – 2022-01-25 (×2): 1 via ORAL
  Filled 2022-01-23 (×3): qty 1

## 2022-01-23 MED ORDER — ONDANSETRON HCL 4 MG PO TABS: 4.0000 mg | ORAL_TABLET | Freq: Four times a day (QID) | ORAL | Status: DC | PRN

## 2022-01-23 MED ORDER — PANTOPRAZOLE SODIUM 40 MG IV SOLR
40.0000 mg | Freq: Two times a day (BID) | INTRAVENOUS | Status: DC
Start: 2022-01-26 — End: 2022-01-26
  Administered 2022-01-26: 40 mg via INTRAVENOUS
  Filled 2022-01-23: qty 10

## 2022-01-23 MED ORDER — ACETAMINOPHEN 650 MG RE SUPP: 650.0000 mg | Freq: Four times a day (QID) | RECTAL | Status: DC | PRN

## 2022-01-23 MED ORDER — FOLIC ACID 1 MG PO TABS
1.0000 mg | ORAL_TABLET | Freq: Every day | ORAL | Status: DC
  Administered 2022-01-24 – 2022-01-25 (×2): 1 mg via ORAL
  Filled 2022-01-23 (×3): qty 1

## 2022-01-23 MED ORDER — LORAZEPAM 2 MG/ML IJ SOLN
1.0000 mg | INTRAMUSCULAR | Status: AC | PRN
  Administered 2022-01-23: 2 mg via INTRAVENOUS
  Filled 2022-01-23: qty 1

## 2022-01-23 MED ORDER — ONDANSETRON HCL 4 MG/2ML IJ SOLN: 4.0000 mg | Freq: Four times a day (QID) | INTRAMUSCULAR | Status: DC | PRN

## 2022-01-23 MED ORDER — SODIUM CHLORIDE 0.9 % IV SOLN
50.0000 ug/h | INTRAVENOUS | Status: DC
  Administered 2022-01-23 – 2022-01-26 (×6): 50 ug/h via INTRAVENOUS
  Filled 2022-01-23 (×8): qty 1

## 2022-01-23 MED ORDER — LORAZEPAM 1 MG PO TABS
1.0000 mg | ORAL_TABLET | ORAL | Status: AC | PRN
  Administered 2022-01-23 – 2022-01-24 (×2): 1 mg via ORAL
  Filled 2022-01-23 (×3): qty 1

## 2022-01-23 MED ORDER — THIAMINE HCL 100 MG/ML IJ SOLN
100.0000 mg | Freq: Every day | INTRAMUSCULAR | Status: DC
  Administered 2022-01-23: 100 mg via INTRAVENOUS
  Filled 2022-01-23: qty 2

## 2022-01-23 MED ORDER — METRONIDAZOLE 500 MG/100ML IV SOLN
500.0000 mg | Freq: Two times a day (BID) | INTRAVENOUS | Status: DC
  Administered 2022-01-23 – 2022-01-26 (×7): 500 mg via INTRAVENOUS
  Filled 2022-01-23 (×7): qty 100

## 2022-01-23 MED ORDER — SODIUM CHLORIDE 0.9 % IV SOLN
2.0000 g | INTRAVENOUS | Status: DC
  Administered 2022-01-23 – 2022-01-25 (×3): 2 g via INTRAVENOUS
  Filled 2022-01-23 (×4): qty 20

## 2022-01-23 MED ORDER — PANTOPRAZOLE INFUSION (NEW) - SIMPLE MED
8.0000 mg/h | INTRAVENOUS | Status: AC
  Administered 2022-01-23 – 2022-01-25 (×6): 8 mg/h via INTRAVENOUS
  Filled 2022-01-23: qty 80
  Filled 2022-01-23: qty 100
  Filled 2022-01-23 (×3): qty 80
  Filled 2022-01-23 (×2): qty 100
  Filled 2022-01-23: qty 80
  Filled 2022-01-23: qty 100
  Filled 2022-01-23: qty 80

## 2022-01-23 MED ORDER — SODIUM CHLORIDE 0.9 % IV BOLUS
250.0000 mL | Freq: Once | INTRAVENOUS | Status: AC
  Administered 2022-01-23: 250 mL via INTRAVENOUS

## 2022-01-23 MED ORDER — ACETAMINOPHEN 325 MG PO TABS: 650.0000 mg | ORAL_TABLET | Freq: Four times a day (QID) | ORAL | Status: DC | PRN

## 2022-01-23 MED ORDER — THIAMINE HCL 100 MG PO TABS
100.0000 mg | ORAL_TABLET | Freq: Every day | ORAL | Status: DC
  Administered 2022-01-24 – 2022-01-25 (×2): 100 mg via ORAL
  Filled 2022-01-23 (×3): qty 1

## 2022-01-23 MED ORDER — SODIUM CHLORIDE 0.9% IV SOLUTION: Freq: Once | INTRAVENOUS | Status: AC

## 2022-01-23 NOTE — Consult Note (Addendum)
Referring Provider: TRH ?Primary Care Physician:  Aggie Cosieresai, Balaji, MD ?Primary Gastroenterologist:  Gentry FitzUnassigned ? ?Reason for Consultation:  Diarrhea, abdominal pain, rectal bleeding ?  ?HPI: Aaron Mcconnell is a 46 y.o. male medical history significant of EtOH abuse, necrotizing fasciitis of L leg ultimately needed L AKA in 2019, presents for evaluation of diarrhea, abdominal pain, and rectal bleeding. ? ?Patient notes yesterday he started to have severe lower abdominal cramping when he went to have a bowel movement all came out were clots of bright red blood.  Denies any stool with bowel movement.  Blood was clotted and filled the toilet bowl.  He continued to have rectal bleeding throughout the night. Yesterday he notes he had 12 bowel movements mostly containing blood and he presented to the ED.  He has had 1 bowel movement this morning.  Today he has had a bowel movement with loose stools as well as clotted red blood.  Today he notes his abdominal pain has improved.  He has appetite and would like to eat. ?  ?Of note, [facility]. He was found to have colitis, new onset cirrhosis with ascites. Diagnostic paracentesis removed 2L of fluid and was negative for SBP and patient was discharged home on Cipro and Flagyl. He reports he finished the 3 days of Cipro and Flagyl and is continuing to take his Lasix and spirolactone.  ? ?Notes he will have approximately 1-2 drinks a week.  Denies family history of colon cancer.  Mother with history of colon polyps.  He denies previous colonoscopy or upper endoscopy. He denies tylenol usage and will use Aleeve once weekly for phantom limb pain.  ? ? ?Past Medical History:  ?Diagnosis Date  ? ETOH abuse   ? ? ?Past Surgical History:  ?Procedure Laterality Date  ? AMPUTATION Left 01/18/2018  ? Procedure: AMPUTATION BELOW KNEE LEFT;  Surgeon: Nadara Mustarduda, Marcus V, MD;  [facility];  Service: Orthopedics;  Laterality: Left;   ? AMPUTATION Left 01/23/2018  ? Procedure: LEFT ABOVE KNEE AMPUTATION;  Surgeon: Nadara Mustarduda, Marcus V, MD;  [facility];  Service: Orthopedics;  Laterality: Left;  ? APPLICATION OF WOUND VAC Left 01/07/2018  ? Procedure: APPLICATION OF WOUND VAC;  Surgeon: Roby LoftsHaddix, Kevin P, MD;  [facility];  Service: Orthopedics;  Laterality: Left;  ? APPLICATION OF WOUND VAC Left 01/09/2018  ? Procedure: APPLICATION OF WOUND VAC;  Surgeon: Nadara Mustarduda, Marcus V, MD;  [facility];  Service: Orthopedics;  Laterality: Left;  ? I & D EXTREMITY Left 01/05/2018  ? Procedure: IRRIGATION AND DEBRIDEMENT EXTREMITY;  Surgeon: Roby LoftsHaddix, Kevin P, MD;  [facility];  Service: Orthopedics;  Laterality: Left;  ? I & D EXTREMITY Left 01/07/2018  ? Procedure: IRRIGATION AND DEBRIDEMENT EXTREMITY;  Surgeon: Roby LoftsHaddix, Kevin P, MD;  [facility];  Service: Orthopedics;  Laterality: Left;  ? I & D EXTREMITY Left 01/09/2018  ? Procedure: IRRIGATION AND DEBRIDEMENT LEFT LEG;  Surgeon: Nadara Mustarduda, Marcus V, MD;  [facility];  Service: Orthopedics;  Laterality: Left;  ? I & D EXTREMITY Left 01/11/2018  ? Procedure: REPEAT IRRIGATION AND DEBRIDEMENT LEFT LEG, APPLY WOUND VAC;  Surgeon: Nadara Mustarduda, Marcus V, MD;  [facility];  Service: Orthopedics;  Laterality: Left;  ? I & D EXTREMITY Left 01/16/2018  ? Procedure: REPEAT IRRIGATION AND DEBRIDEMENT LEFT LEG, APPLY WOUND VAC;  Surgeon: Nadara Mustarduda, Marcus V, MD;  [facility]: [facility];  Service: Orthopedics;  Laterality: Left;  ? ? ?  Prior to Admission medications   ?Medication Sig Start Date End Date Taking? Authorizing Provider  ?albuterol (VENTOLIN HFA) 108 (90 Base) MCG/ACT inhaler Inhale 2 puffs into the lungs every 4 (four) hours as needed for wheezing or shortness of breath. 09/30/21  Yes [provider]  ?Bismuth Subsalicylate (PEPTO-BISMOL PO) Take 2 tablets by mouth every 4 (four) hours as needed (nausea/vomiting).   Yes [provider]  ?Cholecalciferol (VITAMIN D-3) 125 MCG (5000 UT) TABS Take 5,000 Units  by mouth at bedtime.   Yes [provider]  ?clonazePAM (KLONOPIN) 0.5 MG tablet Take 0.5 mg by mouth at bedtime. 12/01/21  Yes [provider]  ?Fish Oil-Cholecalciferol (OMEGA-3 + VITAMIN D3 PO) Take 1 tablet by mouth at bedtime. Fish oil 1200 mg, Vitamin D3 2000 units   Yes [provider]  ?furosemide (LASIX) 20 MG tablet Take 20 mg by mouth every morning. 01/12/22  Yes [provider]  ?lactulose (CHRONULAC) 10 GM/15ML solution Take 10-20 g by mouth at bedtime as needed (constipation).   Yes [provider]  ?Multiple Vitamin (MULTIVITAMIN WITH MINERALS) TABS tablet Take 1 tablet by mouth at bedtime.   Yes [provider]  ?naproxen sodium (ALEVE) 220 MG tablet Take 220 mg by mouth daily as needed (pain).   Yes [provider]  ?potassium gluconate 595 (99 K) MG TABS tablet Take 595 mg by mouth at bedtime.   Yes [provider]  ?Probiotic Product (ALIGN) 4 MG CAPS Take 4 mg by mouth at bedtime.   Yes [provider]  ?vitamin C (ASCORBIC ACID) 500 MG tablet Take 500 mg by mouth at bedtime.   Yes [provider]  ?Baclofen 5 MG TABS Take 5 mg by mouth 2 (two) times daily. ?Patient not taking: Reported on 01/23/2022 01/04/22   [provider]  ?ciprofloxacin (CIPRO) 500 MG tablet Take 500 mg by mouth 2 (two) times daily. ?Patient not taking: Reported on 01/23/2022 01/12/22   [provider]  ?DULoxetine (CYMBALTA) 30 MG capsule Take 30 mg by mouth daily. ?Patient not taking: Reported on 01/23/2022 01/09/22   [provider]  ?metroNIDAZOLE (FLAGYL) 500 MG tablet Take 500 mg by mouth 2 (two) times daily. ?Patient not taking: Reported on 01/23/2022 01/12/22   [provider]  ?mirtazapine (REMERON) 15 MG tablet Take 15 mg by mouth daily. ?Patient not taking: Reported on 01/23/2022 12/24/21   [provider]  ?spironolactone (ALDACTONE) 25 MG tablet Take 25 mg by mouth every morning. 01/12/22    [provider]  ?valsartan (DIOVAN) 160 MG tablet Take 160 mg by mouth daily. ?Patient not taking: Reported on 01/23/2022 12/20/21   [provider]  ? ? ?Scheduled Meds: ? sodium chloride   Intravenous Once  ? folic acid  1 mg Oral Daily  ? multivitamin with minerals  1 tablet Oral Daily  ? [START ON 01/26/2022] pantoprazole  40 mg Intravenous Q12H  ? thiamine  100 mg Oral Daily  ? Or  ? thiamine  100 mg Intravenous Daily  ? ?Continuous Infusions: ? cefTRIAXone (ROCEPHIN)  IV    ? metronidazole 500 mg (01/23/22 1032)  ? octreotide  (SANDOSTATIN)    IV infusion 50 mcg/hr (01/23/22 0330)  ? pantoprazole 8 mg/hr (01/23/22 0341)  ? ?PRN Meds:.acetaminophen **OR** acetaminophen, LORazepam **OR** LORazepam, ondansetron **OR** ondansetron (ZOFRAN) IV ? ?Allergies as of 01/22/2022 - Review Complete 06/06/2018  ?Allergen Reaction Noted  ? Erythromycin Itching 01/05/2018  ? Oxycodone Itching 01/05/2018  ? ? ?  Family History  ?Problem Relation Age of Onset  ? Diabetes Mellitus II Mother   ? ? ?Social History  ? ?Socioeconomic History  ? Marital status: Single  ?  Spouse name: Not on file  ? Number of children: Not on file  ? Years of education: Not on file  ? Highest education level: Not on file  ?Occupational History  ? Not on file  ?Tobacco Use  ? Smoking status: Never  ? Smokeless tobacco: Never  ?Substance and Sexual Activity  ? Alcohol use: Yes  ?  Alcohol/week: 3.0 standard drinks  ?  Types: 3 Shots of liquor per week  ? Drug use: No  ? Sexual activity: Not on file  ?Other Topics Concern  ? Not on file  ?Social History Narrative  ? Not on file  ? ?Social Determinants of Health  ? ?Financial Resource Strain: Not on file  ?Food Insecurity: Not on file  ?Transportation Needs: Not on file  ?Physical Activity: Not on file  ?Stress: Not on file  ?Social Connections: Not on file  ?Intimate Partner Violence: Not on file  ? ? ?Review of Systems: Review of Systems  ?Constitutional:  Negative for chills and fever.   ?HENT:  Negative for hearing loss and tinnitus.   ?Eyes:  Negative for blurred vision and double vision.  ?Respiratory:  Negative for cough and hemoptysis.   ?Cardiovascular:  Negative for chest pain and palpi

## 2022-01-23 NOTE — Progress Notes (Signed)
Subjective: ?Patient admitted this morning, see detailed H&P by Dr Julian Reil ?46 year old male with medical history of alcohol abuse, necrotizing fasciitis of left leg, ultimately required left AKA in 2019 who was admitted to [facility] with abdominal [facility] last week.  Found to have colitis, new onset alcohol induced cirrhosis with ascites.  Paracentesis was performed, negative for SBP.  Patient was discharged home on Cipro and Flagyl.  He came back to the ED in [place] with abdominal pain, diarrhea and maroon stools with clots. ?He was found to have colitis on CT abdomen pelvis, WBC was elevated 23,000.  Patient started on Rocephin and Flagyl.  Stool for C. difficile ordered.  Gastroenterology was consulted ? ?Vitals:  ? 01/23/22 0958 01/23/22 1345  ?BP: (!) 93/58 (!) 93/54  ?Pulse: 71 71  ?Resp: 18 17  ?Temp: 98.3 ?F (36.8 ?C) 98.5 ?F (36.9 ?C)  ?SpO2: 97% 92%  ? ? ? ? ?A/P ?Lower GI bleed ?-Presented with maroon stool, FOBT positive ?-Started on Protonix gtt., empiric octreotide gtt. ?-Gastroenterology was consulted ? ?Anemia ?-Secondary to acute blood loss anemia from above ?-Hemoglobin is 7.7 ?-Transfuse PRBC for hemoglobin less than 7 ?-Follow CBC in a.m. ? ?Colitis ?-WBC 23,000 ?-Differential diagnosis include infectious versus ischemic colitis ?-Continue empiric antibiotics with Rocephin and Flagyl ?-Follow stool for C. difficile PCR ? ?Chronic alcohol abuse ?-Continue CIWA protocol ? ?Alcohol cirrhosis of liver with ascites ?-Paracentesis was negative for SBP ?-Continue Rocephin plus Flagyl ? ? ? ?Meredeth Ide ?[facility] ?Pager- [phone]  ?

## 2022-01-23 NOTE — Plan of Care (Signed)

## 2022-01-23 NOTE — Assessment & Plan Note (Signed)
CIWA ?Tele monitor ?

## 2022-01-23 NOTE — H&P (Signed)
?History and Physical  ? ? ?Patient: Aaron Mcconnell LPF:[id number] DOB: 1976-04-17 ?DOA: 01/23/2022 ?DOS: the patient was seen and examined on 01/23/2022 ?PCP: Aggie Cosier, MD  ?Patient coming from: Outside Hospital ? ?Chief Complaint: No chief complaint on file. ? ?HPI: Aaron Mcconnell is a 46 y.o. 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 ? ?Pt admitted to [facility] with abd pain last week.  Found to have colitis, new onset EtOH cirrhosis with ascites.  Tap was negative for SBP.  Pt discharged home on cipro+flagyl. ? ?Pt now back in to ED yesterday at [facility] with abd pain, diarrhea with maroon stool and clots.  Symptoms constant, severe, worsening. ? ?Review of Systems: As mentioned in the history of present illness. All other systems reviewed and are negative. ?Past Medical History:  ?Diagnosis Date  ? ETOH abuse   ? ?Past Surgical History:  ?Procedure Laterality Date  ? AMPUTATION Left 01/18/2018  ? Procedure: AMPUTATION BELOW KNEE LEFT;  Surgeon: Nadara Mustard, MD;  Location: [facility] OR;  Service: Orthopedics;  Laterality: Left;  ? AMPUTATION Left 01/23/2018  ? Procedure: LEFT ABOVE KNEE AMPUTATION;  Surgeon: Nadara Mustard, MD;  Location: [facility] OR;  Service: Orthopedics;  Laterality: Left;  ? APPLICATION OF WOUND VAC Left 01/07/2018  ? Procedure: APPLICATION OF WOUND VAC;  Surgeon: Roby Lofts, MD;  Location: [facility] OR;  Service: Orthopedics;  Laterality: Left;  ? APPLICATION OF WOUND VAC Left 01/09/2018  ? Procedure: APPLICATION OF WOUND VAC;  Surgeon: Nadara Mustard, MD;  Location: [facility] OR;  Service: Orthopedics;  Laterality: Left;  ? I & D EXTREMITY Left 01/05/2018  ? Procedure: IRRIGATION AND DEBRIDEMENT EXTREMITY;  Surgeon: Roby Lofts, MD;  Location: [facility] OR;  Service: Orthopedics;  Laterality: Left;  ? I & D EXTREMITY Left 01/07/2018  ? Procedure: IRRIGATION AND DEBRIDEMENT EXTREMITY;  Surgeon: Roby Lofts, MD;  Location: [facility] OR;   Service: Orthopedics;  Laterality: Left;  ? I & D EXTREMITY Left 01/09/2018  ? Procedure: IRRIGATION AND DEBRIDEMENT LEFT LEG;  Surgeon: Nadara Mustard, MD;  Location: [facility] OR;  Service: Orthopedics;  Laterality: Left;  ? I & D EXTREMITY Left 01/11/2018  ? Procedure: REPEAT IRRIGATION AND DEBRIDEMENT LEFT LEG, APPLY WOUND VAC;  Surgeon: Nadara Mustard, MD;  Location: [facility] OR;  Service: Orthopedics;  Laterality: Left;  ? I & D EXTREMITY Left 01/16/2018  ? Procedure: REPEAT IRRIGATION AND DEBRIDEMENT LEFT LEG, APPLY WOUND VAC;  Surgeon: Nadara Mustard, MD;  Location: [facility] OR;  Service: Orthopedics;  Laterality: Left;  ? ?Social History:  reports that he has never smoked. He has never used smokeless tobacco. He reports current alcohol use of about 3.0 standard drinks per week. He reports that he does not use drugs. ? ?Allergies  ?Allergen Reactions  ? Neosporin [Bacitracin-Polymyxin B] Other (See Comments)  ?  Arm turned completely pinkish/red  ? Erythromycin Itching  ? Oxycodone Itching  ? ? ?Family History  ?Problem Relation Age of Onset  ? Diabetes Mellitus II Mother   ? ? ?Prior to Admission medications   ?Medication Sig Start Date End Date Taking? Authorizing Provider  ?Ascorbic Acid (VITAMIN C) 100 MG tablet Take 100 mg by mouth daily.   Yes [provider]  ?Bacillus Coagulans-Inulin (ALIGN PREBIOTIC-PROBIOTIC PO) Take by mouth.   Yes [provider]  ?Cholecalciferol (VITAMIN D) 2000 units CAPS Take by mouth.  Yes [provider]  ?Omega-3 Fatty Acids (FISH OIL) 1200 MG CAPS Take by mouth.   Yes [provider]  ?doxycycline (VIBRA-TABS) 100 MG tablet TAKE 1 TABLET BY MOUTH TWICE A DAY 04/22/18   Nadara Mustarduda, Marcus V, MD  ?gabapentin (NEURONTIN) 300 MG capsule TAKE 2 CAPSULES (600 MG TOTAL) BY MOUTH 3 (THREE) TIMES DAILY. 04/29/18   Nadara Mustarduda, Marcus V, MD  ?HYDROcodone-acetaminophen (NORCO/VICODIN) 5-325 MG tablet Take 1-2 tablets by mouth every 6 (six) hours as needed for moderate pain. 02/07/18    Nadara Mustarduda, Marcus V, MD  ?methocarbamol (ROBAXIN) 500 MG tablet Take 1 tablet (500 mg total) by mouth 3 (three) times daily. 01/31/18   Nadara Mustarduda, Marcus V, MD  ? ? ?Physical Exam: ?Vitals:  ? 01/23/22 0217  ?BP: (!) 101/57  ?Pulse: 76  ?Resp: 14  ?Temp: 98.4 ?F (36.9 ?C)  ?TempSrc: Oral  ?SpO2: 97%  ?Weight: 106.1 kg  ?Height: 6\' 5"  (1.956 m)  ? ?Constitutional: NAD, calm, comfortable ?Eyes: PERRL, lids and conjunctivae normal ?ENMT: Mucous membranes are moist. Posterior pharynx clear of any exudate or lesions.Normal dentition.  ?Neck: normal, supple, no masses, no thyromegaly ?Respiratory: clear to auscultation bilaterally, no wheezing, no crackles. Normal respiratory effort. No accessory muscle use.  ?Cardiovascular: Regular rate and rhythm, no murmurs / rubs / gallops. No extremity edema. 2+ pedal pulses. No carotid bruits.  ?Abdomen: no tenderness, no masses palpated. No hepatosplenomegaly. Bowel sounds positive.  ?Musculoskeletal: L AKA ?Skin: no rashes, lesions, ulcers. No induration, pale ?Neurologic: CN 2-12 grossly intact. Sensation intact, DTR normal. Strength 5/5 in all 4.  ?Psychiatric: Normal judgment and insight. Alert and oriented x 3. Normal mood.  ? ?Data Reviewed: ? ?Initial HGB in ED at 1800 was 9.5, repeat at 2000 was 8.4 ?WBC 23k ?Lactate 3.8 ?CT AP = Colitis ?INR 1.6 ? ? ?Assessment and Plan: ?* GI bleed ?Maroon stool, hemoccult positive.  HGB dropped from 9.5 to 8.4 (got in sign out that this drop was from last week until today, however labs from [place] on paper make it look like drop was between 1800 and 2000 last night!) ?Type and screen ?Empiric protonix GTT ?Continue empiric octreotide gtt ?Vitals appear stable for the moment ?Tachy to 150 in ED earlier but this resolved ?Actually EKG looks like this might have been A.Flutter 2:1 to me. ?Message sent to Dr. Bosie ClosSchooler for AM GI consult. ?NPO ?IVF ? ?Colitis ?Colitis with WBC 23k. ?DDx includes bacterial colitis vs Cdiff given cipro treatment this  past week. ?C.Diff pending ?Needs PO vanc ordered if positive ?GI pathogen panel ?[facility] ?Continuing flagyl for now, will re-order rocephin for evening for empiric treatment of colitis. ?Ordering repeat CBC/CMP here at this time. ? ?Chronic alcohol abuse ?CIWA ?Tele monitor ? ?Alcoholic cirrhosis of liver with ascites (HCC) ?New diagnosis as of last week ?Paracentesis last week was negative for SBP. ? Got Rocephin + Flagyl in ED anyhow. ? ? ? ? ? Advance Care Planning:   Code Status: Full Code ? ?Consults: Message sent to Dr. Bosie ClosSchooler for AM GI consult ? ?Family Communication: None ? ?Severity of Illness: ?The appropriate patient status for this patient is INPATIENT. Inpatient status is judged to be reasonable and necessary in order to provide the required intensity of service to ensure the patient's safety. The patient's presenting symptoms, physical exam findings, and initial radiographic and laboratory data in the context of their chronic comorbidities is felt to place them at high risk for further  clinical deterioration. Furthermore, it is not anticipated that the patient will be medically stable for discharge from the hospital within 2 midnights of admission.  ? ?* I certify that at the point of admission it is my clinical judgment that the patient will require inpatient hospital care spanning beyond 2 midnights from the point of admission due to high intensity of service, high risk for further deterioration and high frequency of surveillance required.* ? ?Author: ?Hillary Bow., DO ?01/23/2022 3:51 AM ? ?For on call review [email or web address].  ?

## 2022-01-23 NOTE — Assessment & Plan Note (Addendum)
Maroon stool, hemoccult positive.  HGB dropped from 9.5 to 8.4 (got in sign out that this drop was from last week until today, however labs from [place] on paper make it look like drop was between 1800 and 2000 last night!) ?1. Type and screen ?2. Empiric protonix GTT ?3. Continue empiric octreotide gtt ?4. Vitals appear stable for the moment ?1. Tachy to 150 in ED earlier but this resolved ?2. Actually EKG looks like this might have been A.Flutter 2:1 to me. ?5. Message sent to Dr. Bosie Clos for AM GI consult. ?6. NPO ?7. IVF ?

## 2022-01-23 NOTE — Assessment & Plan Note (Signed)
New diagnosis as of last week ?Paracentesis last week was negative for SBP. ? Got Rocephin + Flagyl in ED anyhow. ?

## 2022-01-23 NOTE — Assessment & Plan Note (Addendum)
Colitis with WBC 23k. ?DDx includes bacterial colitis vs Cdiff given cipro treatment this past week. ?1. C.Diff pending ?1. Needs PO vanc ordered if positive ?2. GI pathogen panel ?3. Got rocephin + flagyl at [facility] ED ?4. Continuing flagyl for now, will re-order rocephin for evening for empiric treatment of colitis. ?5. Ordering repeat CBC/CMP here at this time. ?

## 2022-01-24 DIAGNOSIS — K7031 Alcoholic cirrhosis of liver with ascites: Secondary | ICD-10-CM | POA: Diagnosis not present

## 2022-01-24 DIAGNOSIS — F1010 Alcohol abuse, uncomplicated: Secondary | ICD-10-CM | POA: Diagnosis not present

## 2022-01-24 DIAGNOSIS — K529 Noninfective gastroenteritis and colitis, unspecified: Secondary | ICD-10-CM | POA: Diagnosis not present

## 2022-01-24 LAB — COMPREHENSIVE METABOLIC PANEL
ALT: 16 U/L (ref 0–44)
AST: 75 U/L — ABNORMAL HIGH (ref 15–41)
Albumin: 2 g/dL — ABNORMAL LOW (ref 3.5–5.0)
Alkaline Phosphatase: 107 U/L (ref 38–126)
Anion gap: 6 (ref 5–15)
BUN: 11 mg/dL (ref 6–20)
CO2: 26 mmol/L (ref 22–32)
Calcium: 7.6 mg/dL — ABNORMAL LOW (ref 8.9–10.3)
Chloride: 106 mmol/L (ref 98–111)
Creatinine, Ser: 0.75 mg/dL (ref 0.61–1.24)
GFR, Estimated: >60 mL/min (ref >60)
Glucose, Bld: 116 mg/dL — ABNORMAL HIGH (ref 70–99)
Potassium: 4.3 mmol/L (ref 3.5–5.1)
Sodium: 138 mmol/L (ref 135–145)
Total Bilirubin: 1.6 mg/dL — ABNORMAL HIGH (ref 0.3–1.2)
Total Protein: 5.2 g/dL — ABNORMAL LOW (ref 6.5–8.1)

## 2022-01-24 LAB — HEMOGLOBIN AND HEMATOCRIT, BLOOD
HCT: 23.1 % — ABNORMAL LOW (ref 39.0–52.0)
HCT: 22.4 % — ABNORMAL LOW (ref 39.0–52.0)
HCT: 23.3 % — ABNORMAL LOW (ref 39.0–52.0)
HCT: 23.3 % — ABNORMAL LOW (ref 39.0–52.0)
Hemoglobin: 7.5 g/dL — ABNORMAL LOW (ref 13.0–17.0)
Hemoglobin: 7.7 g/dL — ABNORMAL LOW (ref 13.0–17.0)
Hemoglobin: 7.6 g/dL — ABNORMAL LOW (ref 13.0–17.0)
Hemoglobin: 7.6 g/dL — ABNORMAL LOW (ref 13.0–17.0)

## 2022-01-24 LAB — GLUCOSE, CAPILLARY: Glucose-Capillary: 102 mg/dL — ABNORMAL HIGH (ref 70–99)

## 2022-01-24 LAB — CBC
HCT: 20.9 % — ABNORMAL LOW (ref 39.0–52.0)
Hemoglobin: 6.6 g/dL — CL (ref 13.0–17.0)
MCH: 37.3 pg — ABNORMAL HIGH (ref 26.0–34.0)
MCHC: 31.6 g/dL (ref 30.0–36.0)
MCV: 118.1 fL — ABNORMAL HIGH (ref 80.0–100.0)
Platelets: 171 10*3/uL (ref 150–400)
RBC: 1.77 MIL/uL — ABNORMAL LOW (ref 4.22–5.81)
RDW: 17.2 % — ABNORMAL HIGH (ref 11.5–15.5)
WBC: 9.3 10*3/uL (ref 4.0–10.5)
nRBC: 0 % (ref 0.0–0.2)

## 2022-01-24 LAB — PREPARE RBC (CROSSMATCH)

## 2022-01-24 MED ORDER — PEG 3350-KCL-NA BICARB-NACL 420 G PO SOLR
4000.0000 mL | Freq: Once | ORAL | Status: AC
  Administered 2022-01-25: 4000 mL via ORAL
  Filled 2022-01-24: qty 4000

## 2022-01-24 MED ORDER — SODIUM CHLORIDE 0.9% IV SOLUTION: Freq: Once | INTRAVENOUS | Status: AC

## 2022-01-24 MED ORDER — PEG 3350-KCL-NA BICARB-NACL 420 G PO SOLR: 4000.0000 mL | Freq: Once | ORAL | Status: DC

## 2022-01-24 MED ORDER — SODIUM CHLORIDE 0.9 % IV SOLN: INTRAVENOUS | Status: DC

## 2022-01-24 NOTE — Plan of Care (Signed)
  Problem: Clinical Measurements: Goal: Cardiovascular complication will be avoided Outcome: Progressing   Problem: Nutrition: Goal: Adequate nutrition will be maintained Outcome: Progressing   Problem: Coping: Goal: Level of anxiety will decrease Outcome: Progressing   

## 2022-01-24 NOTE — Progress Notes (Signed)
[facility] Progress Note ? ?Aaron Mcconnell 46 y.o. 10-09-1976 ? ?CC:  Diarrhea, abdominal pain, rectal bleeding, cirrhosis ? ? ?Subjective: ?Examined laying comfortably in bed. He had Hgb of 6.5 this AM requiring 1 unit pRBC. Since his hgb has been stable around 7.5. He denies any further episodes of rectal bleeding today. No bowel movements today. Denies abdominal pain.  ? ?ROS : Review of Systems  ?Constitutional:  Negative for chills, fever and weight loss.  ?Cardiovascular:  Negative for chest pain and palpitations.  ?Gastrointestinal:  Negative for abdominal pain, blood in stool, constipation, diarrhea, heartburn, melena, nausea and vomiting.  ?Genitourinary:  Negative for dysuria and urgency.  ?Skin:  Negative for itching and rash.  ? ? ? ?Objective: ?Vital signs in last 24 hours: ?Vitals:  ? 01/24/22 0640 01/24/22 0903  ?BP: 100/73 (!) 94/57  ?Pulse: 76 79  ?Resp: 18 18  ?Temp: 98.4 ?F (36.9 ?C) 98.6 ?F (37 ?C)  ?SpO2:  95%  ? ? ?Physical Exam: ? ?General:  Alert, cooperative, no distress, appears stated age  ?Head:  Normocephalic, without obvious abnormality, atraumatic  ?Eyes:  Anicteric sclera, EOM's intact  ?Lungs:   Clear to auscultation bilaterally, respirations unlabored  ?Heart:  Regular rate and rhythm, S1, S2 normal  ?Abdomen:   Soft, non-tender, bowel sounds active all four quadrants,  no masses,   ?Extremities: Extremities normal, atraumatic, right leg AKA, no  edema  ?Pulses: 2+ and symmetric  ? ? ?Lab Results: ?Recent Labs  ?  01/23/22 ?7062 01/24/22 ?3762  ?NA 134* 138  ?K 4.4 4.3  ?CL 98 106  ?CO2 27 26  ?GLUCOSE 159* 116*  ?BUN 11 11  ?CREATININE 0.82 0.75  ?CALCIUM 8.0* 7.6*  ? ?Recent Labs  ?  01/23/22 ?8315 01/24/22 ?1761  ?AST 55* 75*  ?ALT 18 16  ?ALKPHOS 126 107  ?BILITOT 2.0* 1.6*  ?PROT 6.1* 5.2*  ?ALBUMIN 2.3* 2.0*  ? ?Recent Labs  ?  01/23/22 ?6073 01/23/22 ?1003 01/24/22 ?7106 01/24/22 ?2694 01/24/22 ?1230  ?WBC 14.9*  --  9.3  --   --   ?NEUTROABS 12.3*  --   --   --    --   ?HGB 7.7*   < > 6.6* 7.5* 7.7*  ?HCT 23.5*   < > 20.9* 23.1* 22.4*  ?MCV 111.4*  --  118.1*  --   --   ?PLT 227  --  171  --   --   ? < > = values in this interval not displayed.  ? ?Recent Labs  ?  01/23/22 ?8546  ?LABPROT 19.0*  ?INR 1.6*  ? ? ? ? ?Assessment ?Anemia  ?Rectal bleeding ?Abdominal pain ?Alcoholic liver cirrhosis with ascites ? ?HGB 7.7(6.5) Platelets 171(227) ?AST 75 (55) ALT 16 (18) ?Alkphos [id number]) TBili 1.6(2.0) ?GFR >60  ?INR 01/23/2022 1.6  ?MELD-Na score: 13 at 01/24/2022  3:44 AM ?MELD score: 13 at 01/24/2022  3:44 AM ?Calculated from: ?Serum Creatinine: 0.75 mg/dL (Using min of 1 mg/dL) at 11/29/348  0:93 AM ?Serum Sodium: 138 mmol/L (Using max of 137 mmol/L) at 01/24/2022  3:44 AM ?Total Bilirubin: 1.6 mg/dL at 05/23/8298  3:71 AM ?INR(ratio): 1.6 at 01/23/2022  3:34 AM ?Age: 88 years  ? ?Anemia s/p 1 unit pRBC, no further rectal bleeding, will continue to monitor. ?  ?Outside hospital with CT AP consistent with colitis. Will need records sent over.  ?  ?Possible ischemic colitis, doubt infectious colitis. Doubt upper tract source. ? ? ?Plan: ?Plan for  EGD/Colonoscopy 4/6. I thoroughly discussed the procedures to include nature, alternatives, benefits, and risks including but not limited to bleeding, perforation, infection, anesthesia/cardiac and pulmonary complications. Patient provides understanding and gave verbal consent to proceed. ?Continue Protonix 40 mg IV BID. ?Nulytely prep, clear liquid diet, NPO at midnight. ?Continue daily CBC with transfusion as needed to maintain Hgb >7.  ?Advised complete alcohol cessation ?Consider abdominal U/S to assess ascites but await outside records from [place]. ?[facility] will follow.   ? ?Emmit Alexanders PA-C ?01/24/2022, 2:14 PM ? ?Contact #  [phone]  ?

## 2022-01-24 NOTE — Progress Notes (Signed)
[facility] ? ?PROGRESS NOTE ? ?Aaron Mcconnell [id number] DOB: 1976/06/30 DOA: 01/23/2022 ?PCP: Kennieth Rad, MD ? ? ?Brief HPI:   ?46 year old male with medical history of alcohol abuse, necrotizing fasciitis of left leg, ultimately required left AKA in 2019 who was admitted to [facility] with abdominal pain last week.  Found to have colitis, new onset alcohol induced cirrhosis with ascites.  Paracentesis was performed, negative for SBP.  Patient was discharged home on Cipro and Flagyl.  He came back to the ED in [place] with abdominal pain, diarrhea and maroon stools with clots. ?He was found to have colitis on CT abdomen pelvis, WBC was elevated 23,000.  Patient started on Rocephin and Flagyl.  Stool for C. difficile ordered.  Gastroenterology was consulted. ? ? ? ?Subjective  ? ?Patient seen and examined, hemoglobin was 6.6 this morning.  S/p 1 unit PRBC.  Repeat hemoglobin is 7.7. ? ? Assessment/Plan:  ? ? ? ? ?Lower GI bleed ?-Presented with maroon stool, FOBT positive ?-Started on Protonix gtt., empiric octreotide gtt. ?-Gastroenterology was consulted ?-Plan for EGD and colonoscopy on 01/26/2022 ?  ?Anemia ?-Secondary to acute blood loss anemia from above ?-Hemoglobin is 7.7 ?-S/p 1 unit PRBC for hemoglobin 6.6 this morning ?-Follow CBC in a.m. ?  ?Colitis ?-WBC 23,000; improved to 9000 this morning ?-Differential diagnosis include infectious versus ischemic colitis ?-Continue empiric antibiotics with Rocephin and Flagyl ?-Follow stool for C. difficile PCR ?  ?Chronic alcohol abuse ?-Continue CIWA protocol ?  ?Alcohol cirrhosis of liver with ascites ?-Paracentesis was negative for SBP ?-Continue Rocephin plus Flagyl ? ? ?Medications ? ?  ? folic acid  1 mg Oral Daily  ? multivitamin with minerals  1 tablet Oral Daily  ? [START ON 01/26/2022] pantoprazole  40 mg Intravenous Q12H  ? [START ON 01/25/2022] polyethylene glycol-electrolytes  4,000 mL Oral Once  ? thiamine  100 mg Oral Daily  ? Or  ?  thiamine  100 mg Intravenous Daily  ? ? ? Data Reviewed:  ? ?CBG: ? ?No results for input(s): GLUCAP in the last 168 hours. ? ?SpO2: 95 %  ? ? ?Vitals:  ? 01/24/22 0103 01/24/22 [id number] 01/24/22 [id number] 01/24/22 [id number]  ?BP: 90/62 93/64 100/73 (!) 94/57  ?Pulse: 77 74 76 79  ?Resp: 18 16 18 18   ?Temp: 98.9 ?F (37.2 ?C) 98.2 ?F (36.8 ?C) 98.4 ?F (36.9 ?C) 98.6 ?F (37 ?C)  ?TempSrc: Oral Oral  Oral  ?SpO2: 94% 94%  95%  ?Weight:      ?Height:      ? ? ? ? ?Data Reviewed: ? ?Basic Metabolic Panel: ?Recent Labs  ?Lab 01/23/22 ?[id number] 01/24/22 ?[id number]  ?NA 134* 138  ?K 4.4 4.3  ?CL 98 106  ?CO2 27 26  ?GLUCOSE 159* 116*  ?BUN 11 11  ?CREATININE 0.82 0.75  ?CALCIUM 8.0* 7.6*  ? ? ?CBC: ?Recent Labs  ?Lab 01/23/22 ?[id number] 01/23/22 ?1003 01/23/22 ?1545 01/23/22 ?2112 01/24/22 ?[id number] 01/24/22 ?[id number] 01/24/22 ?1230  ?WBC 14.9*  --   --   --  9.3  --   --   ?NEUTROABS 12.3*  --   --   --   --   --   --   ?HGB 7.7*   < > 7.7* 7.5* 6.6* 7.5* 7.7*  ?HCT 23.5*   < > 23.3* 22.5* 20.9* 23.1* 22.4*  ?MCV 111.4*  --   --   --  118.1*  --   --   ?PLT 227  --   --   --  171  --   --   ? < > = values in this interval not displayed.  ? ? ?LFT ?Recent Labs  ?Lab 01/23/22 ?[id number] 01/24/22 ?[id number]  ?AST 55* 75*  ?ALT 18 16  ?ALKPHOS 126 107  ?BILITOT 2.0* 1.6*  ?PROT 6.1* 5.2*  ?ALBUMIN 2.3* 2.0*  ? ?  ?Antibiotics: ?Anti-infectives (From admission, onward)  ? ? Start     Dose/Rate Route Frequency Ordered Stop  ? 01/23/22 1800  cefTRIAXone (ROCEPHIN) 2 g in sodium chloride 0.9 % 100 mL IVPB       ? 2 g ?200 mL/hr over 30 Minutes Intravenous Every 24 hours 01/23/22 0326    ? 01/23/22 1000  metroNIDAZOLE (FLAGYL) IVPB 500 mg       ? 500 mg ?100 mL/hr over 60 Minutes Intravenous Every 12 hours 01/23/22 0314    ? ?  ? ? ? ?DVT prophylaxis: SCDs ? ?Code Status: Full code ? ?Family Communication: No family at bedside ? ? ?CONSULTS  ? ? ?Objective  ? ? ?Physical Examination: ? ? ?General-appears in no acute distress ?Heart-S1-S2, regular, no murmur  auscultated ?Lungs-clear to auscultation bilaterally, no wheezing or crackles auscultated ?Abdomen-soft, nontender, no organomegaly ?Extremities-no edema in the lower extremities ?Neuro-alert, oriented x3, no focal deficit noted ? ? ?Status is: Inpatient: GI bleed ? ? ? ?  ? ? ?[name] ?  ?[facility] ?If 7PM-7AM, please contact night-coverage at [email or web address], ?Office  [phone] ? ? ?01/24/2022, 3:40 PM  LOS: 1 day  ? ? ? ? ? ? ? ? ? ? ?  ?

## 2022-01-25 DIAGNOSIS — F1010 Alcohol abuse, uncomplicated: Secondary | ICD-10-CM | POA: Diagnosis not present

## 2022-01-25 DIAGNOSIS — K7031 Alcoholic cirrhosis of liver with ascites: Secondary | ICD-10-CM | POA: Diagnosis not present

## 2022-01-25 DIAGNOSIS — K529 Noninfective gastroenteritis and colitis, unspecified: Secondary | ICD-10-CM | POA: Diagnosis not present

## 2022-01-25 LAB — HEMOGLOBIN AND HEMATOCRIT, BLOOD
HCT: 23.3 % — ABNORMAL LOW (ref 39.0–52.0)
HCT: 26.8 % — ABNORMAL LOW (ref 39.0–52.0)
HCT: 24.9 % — ABNORMAL LOW (ref 39.0–52.0)
HCT: 24.7 % — ABNORMAL LOW (ref 39.0–52.0)
Hemoglobin: 7.7 g/dL — ABNORMAL LOW (ref 13.0–17.0)
Hemoglobin: 8.8 g/dL — ABNORMAL LOW (ref 13.0–17.0)
Hemoglobin: 8.4 g/dL — ABNORMAL LOW (ref 13.0–17.0)
Hemoglobin: 8.2 g/dL — ABNORMAL LOW (ref 13.0–17.0)

## 2022-01-25 NOTE — Plan of Care (Signed)
  Problem: Clinical Measurements: Goal: Diagnostic test results will improve Outcome: Progressing   Problem: Nutrition: Goal: Adequate nutrition will be maintained Outcome: Progressing   Problem: Coping: Goal: Level of anxiety will decrease Outcome: Progressing   

## 2022-01-25 NOTE — H&P (View-Only) (Signed)
[facility] Progress Note ? ?Cohen Boettner 46 y.o. 08/04/1976 ? ?CC:  Diarrhea, abdominal pain, rectal bleeding, cirrhosis ? ? ?Subjective: ?Examined laying comfortably in bed.  He denies any further episodes of rectal bleeding today. No bowel movements today or yesterday. Denies abdominal pain. Hgb stable.  ? ?ROS : Review of Systems  ?Constitutional:  Negative for chills and fever.  ?Gastrointestinal:  Negative for abdominal pain, blood in stool, constipation, diarrhea, heartburn, melena, nausea and vomiting.  ?Genitourinary:  Negative for dysuria and urgency.  ?Neurological:  Negative for dizziness and headaches.  ? ? ? ?Objective: ?Vital signs in last 24 hours: ?Vitals:  ? 01/25/22 0556 01/25/22 0915  ?BP: 116/80 108/69  ?Pulse: 80 82  ?Resp: 18 18  ?Temp: 99.4 ?F (37.4 ?C) (!) 97.5 ?F (36.4 ?C)  ?SpO2: 90% 94%  ? ? ?Physical Exam: ? ?General:  Alert, cooperative, no distress, appears stated age  ?Head:  Normocephalic, without obvious abnormality, atraumatic  ?Eyes:  Anicteric sclera, EOM's intact  ?Lungs:   Clear to auscultation bilaterally, respirations unlabored  ?Heart:  Regular rate and rhythm, S1, S2 normal  ?Abdomen:   Soft, non-tender, bowel sounds active all four quadrants,  no masses,   ?Extremities: Extremities normal, atraumatic, no  edema, L AKA  ?Pulses: 2+ and symmetric  ? ? ?Lab Results: ?Recent Labs  ?  01/23/22 ?7341 01/24/22 ?9379  ?NA 134* 138  ?K 4.4 4.3  ?CL 98 106  ?CO2 27 26  ?GLUCOSE 159* 116*  ?BUN 11 11  ?CREATININE 0.82 0.75  ?CALCIUM 8.0* 7.6*  ? ?Recent Labs  ?  01/23/22 ?0240 01/24/22 ?9735  ?AST 55* 75*  ?ALT 18 16  ?ALKPHOS 126 107  ?BILITOT 2.0* 1.6*  ?PROT 6.1* 5.2*  ?ALBUMIN 2.3* 2.0*  ? ?Recent Labs  ?  01/23/22 ?3299 01/23/22 ?1003 01/24/22 ?2426 01/24/22 ?8341 01/25/22 ?9622 01/25/22 ?2979  ?WBC 14.9*  --  9.3  --   --   --   ?NEUTROABS 12.3*  --   --   --   --   --   ?HGB 7.7*   < > 6.6*   < > 7.7* 8.8*  ?HCT 23.5*   < > 20.9*   < > 23.3* 26.8*  ?MCV 111.4*   --  118.1*  --   --   --   ?PLT 227  --  171  --   --   --   ? < > = values in this interval not displayed.  ? ?Recent Labs  ?  01/23/22 ?8921  ?LABPROT 19.0*  ?INR 1.6*  ? ? ? ? ?Assessment ?Anemia  ?Rectal bleeding ?Abdominal pain ?Alcoholic liver cirrhosis with ascites ? ?HGB 8.8(7.7) Platelets 171 ?AST 75 (55) ALT 16 (18) ?Alkphos [id number]) TBili 1.6(2.0) ?GFR >60  ?INR 01/23/2022 1.6  ?MELD-Na score: 13 at 01/24/2022  3:44 AM ?MELD score: 13 at 01/24/2022  3:44 AM ?Calculated from: ?Serum Creatinine: 0.75 mg/dL (Using min of 1 mg/dL) at 10/31/4172  0:81 AM ?Serum Sodium: 138 mmol/L (Using max of 137 mmol/L) at 01/24/2022  3:44 AM ?Total Bilirubin: 1.6 mg/dL at 01/25/8184  6:31 AM ?INR(ratio): 1.6 at 01/23/2022  3:34 AM ?Age: 26 years ?  ?Anemia s/p 1 unit pRBC on 4/4, no further rectal bleeding, will continue to monitor Hgb/Hct. Will evaluate for continuation of rectal bleeding or hematochezia with starting bowel prep.  ?  ?Outside hospital with CT AP consistent with colitis. Will need records sent over.  ?  ?Possible ischemic  colitis, doubt infectious colitis. Doubt upper tract source. ? ?Plan: ?Plan for EGD/Colonoscopy 4/6. I thoroughly discussed the procedures to include nature, alternatives, benefits, and risks including but not limited to bleeding, perforation, infection, anesthesia/cardiac and pulmonary complications. Patient provides understanding and gave verbal consent to proceed. ?Continue Protonix 40 mg IV BID. ?Nulytely prep, clear liquid diet, NPO at midnight. ?Continue daily CBC with transfusion as needed to maintain Hgb >7.  ?Advised complete alcohol cessation, will screen for varices with EGD.  ?Consider abdominal U/S to assess ascites but await outside records from [place]. ?[facility] will follow.   ? ?Emmit Alexanders PA-C ?01/25/2022, 10:53 AM ? ?Contact #  [phone] ? ?

## 2022-01-25 NOTE — Progress Notes (Signed)
[facility] ? ?PROGRESS NOTE ? ?Aaron Mcconnell [id number] DOB: [birth year] DOA: 01/23/2022 ?PCP: Kennieth Rad, MD ? ? ?Brief HPI:   ?46 year old male with medical history of alcohol abuse, necrotizing fasciitis of left leg, ultimately required left AKA in 2019 who was admitted to [facility] with abdominal pain last week.  Found to have colitis, new onset alcohol induced cirrhosis with ascites.  Paracentesis was performed, negative for SBP.  Patient was discharged home on Cipro and Flagyl.  He came back to the ED in [place] with abdominal pain, diarrhea and maroon stools with clots. ?He was found to have colitis on CT abdomen pelvis, WBC was elevated 23,000.  Patient started on Rocephin and Flagyl.  Stool for C. difficile ordered.  Gastroenterology was consulted. ? ? ? ?Subjective  ? ?Patient seen and examined, no new complaints.  Hemoglobin stable at 8.8.  Plan for EGD and colonoscopy in a.m. ? ? Assessment/Plan:  ? ? ?Lower GI bleed ?-Presented with maroon stool, FOBT positive ?-Started on Protonix gtt., empiric octreotide gtt. ?-Gastroenterology was consulted ?-Plan for EGD and colonoscopy on 01/26/2022 ?  ?Anemia ?-Secondary to acute blood loss anemia from above ?-Hemoglobin is 8.8 ?-S/p 1 unit PRBC for hemoglobin 6.6 on 01/24/2022 ?-Follow CBC in a.m. ?  ?Colitis ?-Significantly improved ?-WBC 23,000; improved to 9000 this morning ?-Differential diagnosis include infectious versus ischemic colitis ?-Continue empiric antibiotics with Rocephin and Flagyl ?-Did not have bowel movement in the hospital ?  ?Chronic alcohol abuse ?-Continue CIWA protocol ?  ?Alcohol cirrhosis of liver with ascites ?-Paracentesis was negative for SBP ?-Continue Rocephin plus Flagyl ?-Continue thiamine, folate ? ? ?Medications ? ?  ? folic acid  1 mg Oral Daily  ? multivitamin with minerals  1 tablet Oral Daily  ? [START ON 01/26/2022] pantoprazole  40 mg Intravenous Q12H  ? polyethylene glycol-electrolytes  4,000 mL Oral Once   ? thiamine  100 mg Oral Daily  ? Or  ? thiamine  100 mg Intravenous Daily  ? ? ? Data Reviewed:  ? ?CBG: ? ?Recent Labs  ?Lab 01/24/22 ?2145  ?GLUCAP 102*  ? ? ?SpO2: 94 %  ? ? ?Vitals:  ? 01/24/22 0903 01/24/22 2144 01/25/22 0556 01/25/22 0915  ?BP: (!) 94/57 113/77 116/80 108/69  ?Pulse: 79 82 80 82  ?Resp: 18 20 18 18   ?Temp: 98.6 ?F (37 ?C) 99 ?F (37.2 ?C) 99.4 ?F (37.4 ?C) (!) 97.5 ?F (36.4 ?C)  ?TempSrc: Oral Oral Oral Oral  ?SpO2: 95% 94% 90% 94%  ?Weight:      ?Height:      ? ? ? ? ?Data Reviewed: ? ?Basic Metabolic Panel: ?Recent Labs  ?Lab 01/23/22 ?[id number] 01/24/22 ?[id number]  ?NA 134* 138  ?K 4.4 4.3  ?CL 98 106  ?CO2 27 26  ?GLUCOSE 159* 116*  ?BUN 11 11  ?CREATININE 0.82 0.75  ?CALCIUM 8.0* 7.6*  ? ? ?CBC: ?Recent Labs  ?Lab 01/23/22 ?[id number] 01/23/22 ?1003 01/24/22 ?[id number] 01/24/22 ?[id number] 01/24/22 ?1230 01/24/22 ?1816 01/24/22 ?2145 01/25/22 ?[id number] 01/25/22 ?[id number]  ?WBC 14.9*  --  9.3  --   --   --   --   --   --   ?NEUTROABS 12.3*  --   --   --   --   --   --   --   --   ?HGB 7.7*   < > 6.6*   < > 7.7* 7.6* 7.6* 7.7* 8.8*  ?HCT 23.5*   < > 20.9*   < >  22.4* 23.3* 23.3* 23.3* 26.8*  ?MCV 111.4*  --  118.1*  --   --   --   --   --   --   ?PLT 227  --  171  --   --   --   --   --   --   ? < > = values in this interval not displayed.  ? ? ?LFT ?Recent Labs  ?Lab 01/23/22 ?[id number] 01/24/22 ?[id number]  ?AST 55* 75*  ?ALT 18 16  ?ALKPHOS 126 107  ?BILITOT 2.0* 1.6*  ?PROT 6.1* 5.2*  ?ALBUMIN 2.3* 2.0*  ? ?  ?Antibiotics: ?Anti-infectives (From admission, onward)  ? ? Start     Dose/Rate Route Frequency Ordered Stop  ? 01/23/22 1800  cefTRIAXone (ROCEPHIN) 2 g in sodium chloride 0.9 % 100 mL IVPB       ? 2 g ?200 mL/hr over 30 Minutes Intravenous Every 24 hours 01/23/22 0326    ? 01/23/22 1000  metroNIDAZOLE (FLAGYL) IVPB 500 mg       ? 500 mg ?100 mL/hr over 60 Minutes Intravenous Every 12 hours 01/23/22 0314    ? ?  ? ? ? ?DVT prophylaxis: SCDs ? ?Code Status: Full code ? ?Family Communication: No family at bedside ? ? ?CONSULTS   ? ? ?Objective  ? ? ?Physical Examination: ? ? ?General-appears in no acute distress ?Heart-S1-S2, regular, no murmur auscultated ?Lungs-clear to auscultation bilaterally, no wheezing or crackles auscultated ?Abdomen-soft, nontender, no organomegaly ?Extremities-no edema in the lower extremities ?Neuro-alert, oriented x3, no focal deficit noted ? ? ?Status is: Inpatient: GI bleed ? ? ? ?  ? ? ?[name] ?  ?[facility] ?If 7PM-7AM, please contact night-coverage at [email or web address], ?Office  [phone] ? ? ?01/25/2022, 2:07 PM  LOS: 2 days  ? ? ? ? ? ? ? ? ? ? ?  ?

## 2022-01-25 NOTE — Progress Notes (Signed)
[facility] Progress Note ? ?Cohen Boettner 46 y.o. 08/04/1976 ? ?CC:  Diarrhea, abdominal pain, rectal bleeding, cirrhosis ? ? ?Subjective: ?Examined laying comfortably in bed.  He denies any further episodes of rectal bleeding today. No bowel movements today or yesterday. Denies abdominal pain. Hgb stable.  ? ?ROS : Review of Systems  ?Constitutional:  Negative for chills and fever.  ?Gastrointestinal:  Negative for abdominal pain, blood in stool, constipation, diarrhea, heartburn, melena, nausea and vomiting.  ?Genitourinary:  Negative for dysuria and urgency.  ?Neurological:  Negative for dizziness and headaches.  ? ? ? ?Objective: ?Vital signs in last 24 hours: ?Vitals:  ? 01/25/22 0556 01/25/22 0915  ?BP: 116/80 108/69  ?Pulse: 80 82  ?Resp: 18 18  ?Temp: 99.4 ?F (37.4 ?C) (!) 97.5 ?F (36.4 ?C)  ?SpO2: 90% 94%  ? ? ?Physical Exam: ? ?General:  Alert, cooperative, no distress, appears stated age  ?Head:  Normocephalic, without obvious abnormality, atraumatic  ?Eyes:  Anicteric sclera, EOM's intact  ?Lungs:   Clear to auscultation bilaterally, respirations unlabored  ?Heart:  Regular rate and rhythm, S1, S2 normal  ?Abdomen:   Soft, non-tender, bowel sounds active all four quadrants,  no masses,   ?Extremities: Extremities normal, atraumatic, no  edema, L AKA  ?Pulses: 2+ and symmetric  ? ? ?Lab Results: ?Recent Labs  ?  01/23/22 ?7341 01/24/22 ?9379  ?NA 134* 138  ?K 4.4 4.3  ?CL 98 106  ?CO2 27 26  ?GLUCOSE 159* 116*  ?BUN 11 11  ?CREATININE 0.82 0.75  ?CALCIUM 8.0* 7.6*  ? ?Recent Labs  ?  01/23/22 ?0240 01/24/22 ?9735  ?AST 55* 75*  ?ALT 18 16  ?ALKPHOS 126 107  ?BILITOT 2.0* 1.6*  ?PROT 6.1* 5.2*  ?ALBUMIN 2.3* 2.0*  ? ?Recent Labs  ?  01/23/22 ?3299 01/23/22 ?1003 01/24/22 ?2426 01/24/22 ?8341 01/25/22 ?9622 01/25/22 ?2979  ?WBC 14.9*  --  9.3  --   --   --   ?NEUTROABS 12.3*  --   --   --   --   --   ?HGB 7.7*   < > 6.6*   < > 7.7* 8.8*  ?HCT 23.5*   < > 20.9*   < > 23.3* 26.8*  ?MCV 111.4*   --  118.1*  --   --   --   ?PLT 227  --  171  --   --   --   ? < > = values in this interval not displayed.  ? ?Recent Labs  ?  01/23/22 ?8921  ?LABPROT 19.0*  ?INR 1.6*  ? ? ? ? ?Assessment ?Anemia  ?Rectal bleeding ?Abdominal pain ?Alcoholic liver cirrhosis with ascites ? ?HGB 8.8(7.7) Platelets 171 ?AST 75 (55) ALT 16 (18) ?Alkphos [id number]) TBili 1.6(2.0) ?GFR >60  ?INR 01/23/2022 1.6  ?MELD-Na score: 13 at 01/24/2022  3:44 AM ?MELD score: 13 at 01/24/2022  3:44 AM ?Calculated from: ?Serum Creatinine: 0.75 mg/dL (Using min of 1 mg/dL) at 10/31/4172  0:81 AM ?Serum Sodium: 138 mmol/L (Using max of 137 mmol/L) at 01/24/2022  3:44 AM ?Total Bilirubin: 1.6 mg/dL at 01/25/8184  6:31 AM ?INR(ratio): 1.6 at 01/23/2022  3:34 AM ?Age: 26 years ?  ?Anemia s/p 1 unit pRBC on 4/4, no further rectal bleeding, will continue to monitor Hgb/Hct. Will evaluate for continuation of rectal bleeding or hematochezia with starting bowel prep.  ?  ?Outside hospital with CT AP consistent with colitis. Will need records sent over.  ?  ?Possible ischemic  colitis, doubt infectious colitis. Doubt upper tract source. ? ?Plan: ?Plan for EGD/Colonoscopy 4/6. I thoroughly discussed the procedures to include nature, alternatives, benefits, and risks including but not limited to bleeding, perforation, infection, anesthesia/cardiac and pulmonary complications. Patient provides understanding and gave verbal consent to proceed. ?Continue Protonix 40 mg IV BID. ?Nulytely prep, clear liquid diet, NPO at midnight. ?Continue daily CBC with transfusion as needed to maintain Hgb >7.  ?Advised complete alcohol cessation, will screen for varices with EGD.  ?Consider abdominal U/S to assess ascites but await outside records from [place]. ?[facility] will follow.   ? ?Ashford Clouse N Rollin Kotowski PA-C ?01/25/2022, 10:53 AM ? ?Contact #  [phone] ? ?

## 2022-01-26 ENCOUNTER — Encounter (HOSPITAL_COMMUNITY): Payer: Self-pay | Admitting: Internal Medicine

## 2022-01-26 ENCOUNTER — Encounter (HOSPITAL_COMMUNITY): Admission: AD | Disposition: A | Discharge status: Home / Self Care | Payer: Self-pay | Attending: Family Medicine

## 2022-01-26 ENCOUNTER — Inpatient Hospital Stay (HOSPITAL_COMMUNITY): Payer: PRIVATE HEALTH INSURANCE | Admitting: Anesthesiology

## 2022-01-26 DIAGNOSIS — K922 Gastrointestinal hemorrhage, unspecified: Secondary | ICD-10-CM | POA: Diagnosis not present

## 2022-01-26 DIAGNOSIS — K529 Noninfective gastroenteritis and colitis, unspecified: Secondary | ICD-10-CM | POA: Diagnosis not present

## 2022-01-26 DIAGNOSIS — K7031 Alcoholic cirrhosis of liver with ascites: Secondary | ICD-10-CM | POA: Diagnosis not present

## 2022-01-26 DIAGNOSIS — K766 Portal hypertension: Secondary | ICD-10-CM

## 2022-01-26 DIAGNOSIS — F1010 Alcohol abuse, uncomplicated: Secondary | ICD-10-CM | POA: Diagnosis not present

## 2022-01-26 DIAGNOSIS — K3189 Other diseases of stomach and duodenum: Secondary | ICD-10-CM

## 2022-01-26 DIAGNOSIS — K2971 Gastritis, unspecified, with bleeding: Secondary | ICD-10-CM

## 2022-01-26 DIAGNOSIS — I8511 Secondary esophageal varices with bleeding: Secondary | ICD-10-CM

## 2022-01-26 HISTORY — PX: ESOPHAGOGASTRODUODENOSCOPY: SHX5428

## 2022-01-26 HISTORY — PX: BIOPSY: SHX5522

## 2022-01-26 HISTORY — PX: POLYPECTOMY: SHX5525

## 2022-01-26 HISTORY — PX: COLONOSCOPY: SHX5424

## 2022-01-26 LAB — HEMOGLOBIN AND HEMATOCRIT, BLOOD
HCT: 23.7 % — ABNORMAL LOW (ref 39.0–52.0)
HCT: 27.9 % — ABNORMAL LOW (ref 39.0–52.0)
HCT: 27.4 % — ABNORMAL LOW (ref 39.0–52.0)
Hemoglobin: 7.8 g/dL — ABNORMAL LOW (ref 13.0–17.0)
Hemoglobin: 9.3 g/dL — ABNORMAL LOW (ref 13.0–17.0)
Hemoglobin: 8.8 g/dL — ABNORMAL LOW (ref 13.0–17.0)

## 2022-01-26 SURGERY — COLONOSCOPY
Anesthesia: Monitor Anesthesia Care

## 2022-01-26 SURGERY — EGD (ESOPHAGOGASTRODUODENOSCOPY)
Anesthesia: Monitor Anesthesia Care

## 2022-01-26 MED ORDER — PHENYLEPHRINE HCL (PRESSORS) 10 MG/ML IV SOLN
INTRAVENOUS | Status: DC | PRN
  Administered 2022-01-26 (×2): 120 ug via INTRAVENOUS

## 2022-01-26 MED ORDER — THIAMINE HCL 100 MG PO TABS: 100.0000 mg | ORAL_TABLET | Freq: Every day | ORAL | 0 refills | Status: AC

## 2022-01-26 MED ORDER — LIDOCAINE 2% (20 MG/ML) 5 ML SYRINGE
INTRAMUSCULAR | Status: DC | PRN
  Administered 2022-01-26: 40 mg via INTRAVENOUS

## 2022-01-26 MED ORDER — PROPOFOL 10 MG/ML IV BOLUS
INTRAVENOUS | Status: DC | PRN
Start: 2022-01-26 — End: 2022-01-26
  Administered 2022-01-26: 20 mg via INTRAVENOUS
  Administered 2022-01-26: 70 mg via INTRAVENOUS

## 2022-01-26 MED ORDER — SODIUM CHLORIDE 0.9 % IV SOLN: INTRAVENOUS | Status: DC

## 2022-01-26 MED ORDER — FOLIC ACID 1 MG PO TABS: 1.0000 mg | ORAL_TABLET | Freq: Every day | ORAL | 0 refills | Status: AC

## 2022-01-26 MED ORDER — PROPOFOL 500 MG/50ML IV EMUL
INTRAVENOUS | Status: DC | PRN
  Administered 2022-01-26: 200 ug/kg/min via INTRAVENOUS

## 2022-01-26 MED ORDER — LACTATED RINGERS IV SOLN: INTRAVENOUS | Status: DC

## 2022-01-26 NOTE — Interval H&P Note (Signed)
History and Physical Interval Note: ? ?01/26/2022 ?2:54 PM ? ?Aaron Mcconnell  has presented today for surgery, with the diagnosis of anemia, hematochezia.  The various methods of treatment have been discussed with the patient and family. After consideration of risks, benefits and other options for treatment, the patient has consented to  Procedure(s): ?COLONOSCOPY (N/A) ?ESOPHAGOGASTRODUODENOSCOPY (EGD) (N/A) as a surgical intervention.  The patient's history has been reviewed, patient examined, no change in status, stable for surgery.  I have reviewed the patient's chart and labs.  Questions were answered to the patient's satisfaction.   ? ? ?Shirley Friar ? ? ?

## 2022-01-26 NOTE — Anesthesia Preprocedure Evaluation (Signed)
Anesthesia Evaluation  ?Patient identified by MRN, date of birth, ID band ?Patient awake ? ? ? ?Reviewed: ?Allergy & Precautions, NPO status , Patient's Chart, lab work & pertinent test results ? ?Airway ?Mallampati: II ? ?TM Distance: >3 FB ?Neck ROM: Full ? ? ? Dental ?no notable dental hx. ? ?  ?Pulmonary ?neg pulmonary ROS,  ?  ?Pulmonary exam normal ?breath sounds clear to auscultation ? ? ? ? ? ? Cardiovascular ?negative cardio ROS ?Normal cardiovascular exam ?Rhythm:Regular Rate:Normal ? ? ?  ?Neuro/Psych ?negative neurological ROS ? negative psych ROS  ? GI/Hepatic ?negative GI ROS, (+) Cirrhosis  ? ascites ? substance abuse ? alcohol use,   ?Endo/Other  ?negative endocrine ROS ? Renal/GU ?negative Renal ROS  ?negative genitourinary ?  ?Musculoskeletal ?negative musculoskeletal ROS ?(+)  ? Abdominal ?  ?Peds ?negative pediatric ROS ?(+)  Hematology ? ?(+) Blood dyscrasia, anemia ,   ?Anesthesia Other Findings ? ? Reproductive/Obstetrics ?negative OB ROS ? ?  ? ? ? ? ? ? ? ? ? ? ? ? ? ?  ?  ? ? ? ? ? ? ? ? ?Anesthesia Physical ?Anesthesia Plan ? ?ASA: 3 ? ?Anesthesia Plan: MAC  ? ?Post-op Pain Management: Minimal or no pain anticipated  ? ?Induction: Intravenous ? ?PONV Risk Score and Plan: 1 and Treatment may vary due to age or medical condition ? ?Airway Management Planned: Simple Face Mask ? ?Additional Equipment:  ? ?Intra-op Plan:  ? ?Post-operative Plan:  ? ?Informed Consent: I have reviewed the patients History and Physical, chart, labs and discussed the procedure including the risks, benefits and alternatives for the proposed anesthesia with the patient or authorized representative who has indicated his/her understanding and acceptance.  ? ? ? ?Dental advisory given ? ?Plan Discussed with: CRNA and Surgeon ? ?Anesthesia Plan Comments:   ? ? ? ? ? ? ?Anesthesia Quick Evaluation ? ?

## 2022-01-26 NOTE — Op Note (Signed)
[facility] ?Patient Name: Aaron Mcconnell ?Procedure Date: 01/26/2022 ?MRN: [id number] ?Attending MD: Shirley Friar , MD ?Date of Birth: 03/04/1976 ?CSN: [id number] ?Age: 46 ?Admit Type: Outpatient ?Procedure:                Colonoscopy ?Indications:              This is the patient's first colonoscopy,  ?                          Generalized abdominal pain, Hematochezia ?Providers:                Shirley Friar, MD, Nena Polio, RN, Debbora Presto  ?                          Lyman Bishop, Technician, Ponciano Ort, CRNA ?Referring MD:             hospital team ?Medicines:                Propofol per Anesthesia, Monitored Anesthesia Care ?Complications:            No immediate complications. ?Estimated Blood Loss:     Estimated blood loss was minimal. ?Procedure:                Pre-Anesthesia Assessment: ?                          - Prior to the procedure, a History and Physical  ?                          was performed, and patient medications and  ?                          allergies were reviewed. The patient's tolerance of  ?                          previous anesthesia was also reviewed. The risks  ?                          and benefits of the procedure and the sedation  ?                          options and risks were discussed with the patient.  ?                          All questions were answered, and informed consent  ?                          was obtained. Prior Anticoagulants: The patient has  ?                          taken no previous anticoagulant or antiplatelet  ?                          agents. ASA Grade Assessment: III - A patient with  ?  severe systemic disease. After reviewing the risks  ?                          and benefits, the patient was deemed in  ?                          satisfactory condition to undergo the procedure. ?                          After obtaining informed consent, the colonoscope  ?                          was passed under direct  vision. Throughout the  ?                          procedure, the patient's blood pressure, pulse, and  ?                          oxygen saturations were monitored continuously. The  ?                          [id number]-[id number] ([id number] Olympus colonoscope was  ?                          introduced through the anus and advanced to the the  ?                          cecum, identified by appendiceal orifice and  ?                          ileocecal valve. The colonoscopy was performed with  ?                          difficulty due to inadequate bowel prep. Successful  ?                          completion of the procedure was aided by lavage.  ?                          The patient tolerated the procedure well. The  ?                          quality of the bowel preparation was inadequate.  ?                          The terminal ileum, ileocecal valve, appendiceal  ?                          orifice, and rectum were photographed. ?Scope In: 3:19:58 PM ?Scope Out: 3:32:18 PM ?Scope Withdrawal Time: 0 hours 9 minutes 53 seconds  ?Total Procedure Duration: 0 hours 12 minutes 20 seconds  ?Findings: ?     The perianal and digital rectal examinations were normal. ?     A 6 mm polyp was found in the  cecum. The polyp was sessile. The polyp  ?     was removed with a hot snare. Resection and retrieval were complete.  ?     Estimated blood loss: none. ?     Multiple small-mouthed diverticula were found in the sigmoid colon. ?     Internal hemorrhoids were found during retroflexion. The hemorrhoids  ?     were medium-sized and Grade I (internal hemorrhoids that do not  ?     prolapse). ?     A moderate amount of liquid semi-liquid semi-solid stool was found in  ?     the entire colon, interfering with visualization. ?     The terminal ileum appeared normal. ?Impression:               - Preparation of the colon was inadequate. ?                          - One 6 mm polyp in the cecum, removed with a hot  ?                           snare. Resected and retrieved. ?                          - Diverticulosis in the sigmoid colon. ?                          - Internal hemorrhoids. ?                          - Stool in the entire examined colon. ?                          - The examined portion of the ileum was normal. ?Moderate Sedation: ?     Not Applicable - Patient had care per Anesthesia. ?Recommendation:           - Low sodium diet. ?                          - Await pathology results. ?                          - Repeat colonoscopy for surveillance based on  ?                          pathology results. ?Procedure Code(s):        --- Professional --- ?                          [phone], Colonoscopy, flexible; with removal of  ?                          tumor(s), polyp(s), or other lesion(s) by snare  ?                          technique ?Diagnosis Code(s):        --- Professional --- ?  K92.1, Melena (includes Hematochezia) ?                          R10.84, Generalized abdominal pain ?                          K63.5, Polyp of colon ?                          K64.0, First degree hemorrhoids ?                          K57.30, Diverticulosis of large intestine without  ?                          perforation or abscess without bleeding ?CPT copyright 2019 [employer]. All rights reserved. ?The codes documented in this report are preliminary and upon coder review may  ?be revised to meet current compliance requirements. ?Shirley Friar, MD ?01/26/2022 3:46:31 PM ?This report has been signed electronically. ?Number of Addenda: 0 ?

## 2022-01-26 NOTE — Discharge Summary (Addendum)
?Physician Discharge Summary ?  ?Patient: Aaron Mcconnell MRN: [id number] DOB: 10-10-1976  ?Admit date:     01/23/2022  ?Discharge date: 01/26/22  ?Discharge Physician: Meredeth Ide  ? ?PCP: Aggie Cosier, MD  ? ?Recommendations at discharge:  ? ?Follow-up gastroenterology in 1 week check ? ?Discharge Diagnoses: ?Principal Problem: ?  GI bleed ?Active Problems: ?  Colitis ?  Chronic alcohol abuse ?  Alcoholic cirrhosis of liver with ascites (HCC) ? ?Resolved Problems: ?  * No resolved hospital problems. * ? ?Hospital Course: ?46 year old male with medical history of alcohol abuse, necrotizing fasciitis of left leg, ultimately required left AKA in 2019 who was admitted to [facility] with abdominal pain last week.  Found to have colitis, new onset alcohol induced cirrhosis with ascites.  Paracentesis was performed, negative for SBP.  Patient was discharged home on Cipro and Flagyl.  He came back to the ED in [place] with abdominal pain, diarrhea and maroon stools with clots. ?He was found to have colitis on CT abdomen pelvis, WBC was elevated 23,000.  Patient started on Rocephin and Flagyl.  Stool for C. difficile ordered.  Gastroenterology was consulted ? ?Assessment and Plan: ? ? ?Lower GI bleed ?-Presented with maroon stool, FOBT positive ?-Started on Protonix gtt., empiric octreotide gtt. ?-Gastroenterology was consulted ?-EGD and colonoscopy were unremarkable for acute bleed ?-Follow-up GI as outpatient ?  ?Anemia ?-Secondary to acute blood loss anemia from above ?-Hemoglobin is 8.8 ?-S/p 1 unit PRBC for hemoglobin 6.6 on 01/24/2022 ?-Hemoglobin has been stable, today hemoglobin is 8.8 ?  ?Colitis ?-Significantly improved ?-WBC 23,000; improved to 9000 this morning ?-Differential diagnosis include infectious versus ischemic colitis ?-Patient was continued on empiric antibiotics, Rocephin and Flagyl  ?-We will discharge home without antibiotics ?-Did not have bowel movement in the hospital ?  ?Chronic alcohol  abuse ?-Continue folate, thiamine ?-No signs and symptoms of alcohol withdrawal ?  ?Alcohol cirrhosis of liver with ascites ?-Paracentesis was negative for SBP ?-Continue thiamine, folate ?  ? ?  ? ? ?Consultants: Gastroenterology ?Procedures performed: EGD and colonoscopy ?Disposition: Home ?Diet recommendation:  ?Discharge Diet Orders (From admission, onward)  ? ?  Start     Ordered  ? 01/26/22 0000  Diet - low sodium heart healthy       ? 01/26/22 1639  ? ?  ?  ? ?  ? ?Regular diet ?DISCHARGE MEDICATION: ?Allergies as of 01/26/2022   ? ?   Reactions  ? Gabapentin Other (See Comments)  ? Bad dreams/skin crawling  ? Neosporin [bacitracin-polymyxin B] Other (See Comments)  ? Arm turned completely pinkish/red  ? Erythromycin Itching  ? Oxycodone Itching  ? ?  ? ?  ?Medication List  ?  ? ?STOP taking these medications   ? ?ciprofloxacin 500 MG tablet ?Commonly known as: CIPRO ?  ?DULoxetine 30 MG capsule ?Commonly known as: CYMBALTA ?  ?metroNIDAZOLE 500 MG tablet ?Commonly known as: FLAGYL ?  ?mirtazapine 15 MG tablet ?Commonly known as: REMERON ?  ?naproxen sodium 220 MG tablet ?Commonly known as: ALEVE ?  ?potassium gluconate 595 (99 K) MG Tabs tablet ?  ?valsartan 160 MG tablet ?Commonly known as: DIOVAN ?  ? ?  ? ?TAKE these medications   ? ?albuterol 108 (90 Base) MCG/ACT inhaler ?Commonly known as: VENTOLIN HFA ?Inhale 2 puffs into the lungs every 4 (four) hours as needed for wheezing or shortness of breath. ?  ?Align 4 MG Caps ?Take 4 mg by mouth at bedtime. ?  ?  Baclofen 5 MG Tabs ?Take 5 mg by mouth 2 (two) times daily. ?  ?clonazePAM 0.5 MG tablet ?Commonly known as: KLONOPIN ?Take 0.5 mg by mouth at bedtime. ?  ?folic acid 1 MG tablet ?Commonly known as: FOLVITE ?Take 1 tablet (1 mg total) by mouth daily. ?Start taking on: January 27, 2022 ?  ?furosemide 20 MG tablet ?Commonly known as: LASIX ?Take 20 mg by mouth every morning. ?  ?lactulose 10 GM/15ML solution ?Commonly known as: CHRONULAC ?Take 10-20 g by  mouth at bedtime as needed (constipation). ?  ?multivitamin with minerals Tabs tablet ?Take 1 tablet by mouth at bedtime. ?  ?OMEGA-3 + VITAMIN D3 PO ?Take 1 tablet by mouth at bedtime. Fish oil 1200 mg, Vitamin D3 2000 units ?  ?PEPTO-BISMOL PO ?Take 2 tablets by mouth every 4 (four) hours as needed (nausea/vomiting). ?  ?spironolactone 25 MG tablet ?Commonly known as: ALDACTONE ?Take 25 mg by mouth every morning. ?  ?thiamine 100 MG tablet ?Take 1 tablet (100 mg total) by mouth daily. ?Start taking on: January 27, 2022 ?  ?vitamin C 500 MG tablet ?Commonly known as: ASCORBIC ACID ?Take 500 mg by mouth at bedtime. ?  ?Vitamin D-3 125 MCG (5000 UT) Tabs ?Take 5,000 Units by mouth at bedtime. ?  ? ?  ? ? Follow-up Information   ? ? Charlott Rakes, MD Follow up in 1 week(s).   ?Specialty: Gastroenterology ?Contact information: ?[address] ?[address] ?[place] [address] ?[phone] ? ? ?  ?  ? ?  ?  ? ?  ? ?Discharge Exam: ?Ceasar Mons Weights  ? 01/23/22 0217  ?Weight: 106.1 kg  ? ?General-appears in no acute distress ?Heart-S1-S2, regular, no murmur auscultated ?Lungs-clear to auscultation bilaterally, no wheezing or crackles auscultated ?Abdomen-soft, nontender, no organomegaly ?Extremities-no edema in the lower extremities ?Neuro-alert, oriented x3, no focal deficit noted ? ?Condition at discharge: stable ? ?The results of significant diagnostics from this hospitalization (including imaging, microbiology, ancillary and laboratory) are listed below for reference.  ? ?Imaging Studies: ?No results found. ? ?Microbiology: ?Results for orders placed or performed during the hospital encounter of 01/05/18  ?Aerobic/Anaerobic Culture (surgical/deep wound)     Status: None  ? Collection Time: 01/05/18 11:00 AM  ? Specimen: Leg, Left; Tissue  ?Result Value Ref Range Status  ? Specimen Description TISSUE LEFT LEG  Final  ? Special Requests SPEC A  Final  ? Gram Stain   Final  ?  MODERATE WBC PRESENT, PREDOMINANTLY  PMN ?FEW GRAM POSITIVE COCCI IN PAIRS ?FEW GRAM POSITIVE RODS ?RARE GRAM NEGATIVE RODS ?Performed at [facility], [address], [place], [place] [address] ?  ? Culture   Final  ?  RARE ALCALIGENES FAECALIS ?MIXED ANAEROBIC FLORA PRESENT.  CALL LAB IF FURTHER IID REQUIRED. ?  ? Report Status 01/10/2018 FINAL  Final  ? Organism ID, Bacteria ALCALIGENES FAECALIS  Final  ?    Susceptibility  ? Alcaligenes faecalis - MIC*  ?  CEFEPIME 4 SENSITIVE Sensitive   ?  CEFAZOLIN 32 INTERMEDIATE Intermediate   ?  GENTAMICIN 2 SENSITIVE Sensitive   ?  CIPROFLOXACIN 1 SENSITIVE Sensitive   ?  IMIPENEM 0.5 SENSITIVE Sensitive   ?  TRIMETH/SULFA <=20 SENSITIVE Sensitive   ?  * RARE ALCALIGENES FAECALIS  ?Surgical pcr screen     Status: Abnormal  ? Collection Time: 01/07/18  7:45 AM  ? Specimen: Nasal Mucosa; Nasal Swab  ?Result Value Ref Range Status  ? MRSA,  PCR NEGATIVE NEGATIVE Final  ? Staphylococcus aureus POSITIVE (A) NEGATIVE Final  ?  Comment: (NOTE) ?The Xpert SA Assay (FDA approved for NASAL specimens in patients 3722 ?years of age and older), is one component of a comprehensive ?surveillance program. It is not intended to diagnose infection nor to ?guide or monitor treatment. ?Performed at [facility], [address] [place], [place] ?[id number] ?  ? ? ?Labs: ?CBC: ?Recent Labs  ?Lab 01/23/22 ?[id number] 01/23/22 ?1003 01/24/22 ?[id number] 01/24/22 ?[id number] 01/25/22 ?1548 01/25/22 ?2157 01/26/22 ?[id number] 01/26/22 ?[id number] 01/26/22 ?1622  ?WBC 14.9*  --  9.3  --   --   --   --   --   --   ?NEUTROABS 12.3*  --   --   --   --   --   --   --   --   ?HGB 7.7*   < > 6.6*   < > 8.4* 8.2* 7.8* 9.3* 8.8*  ?HCT 23.5*   < > 20.9*   < > 24.9* 24.7* 23.7* 27.9* 27.4*  ?MCV 111.4*  --  118.1*  --   --   --   --   --   --   ?PLT 227  --  171  --   --   --   --   --   --   ? < > = values in this interval not displayed.  ? ?Basic Metabolic Panel: ?Recent Labs  ?Lab 01/23/22 ?[id number] 01/24/22 ?[id number]  ?NA 134* 138  ?K 4.4 4.3  ?CL 98 106  ?CO2 27 26   ?GLUCOSE 159* 116*  ?BUN 11 11  ?CREATININE 0.82 0.75  ?CALCIUM 8.0* 7.6*  ? ?Liver Function Tests: ?Recent Labs  ?Lab 01/23/22 ?[id number] 01/24/22 ?[id number]  ?AST 55* 75*  ?ALT 18 16  ?ALKPHOS 126 107  ?BILITOT 2.0* 1.6*

## 2022-01-26 NOTE — Op Note (Signed)
[facility] ?Patient Name: Aaron ParasJay Sibrian ?Procedure Date: 01/26/2022 ?MRN: [id number] ?Attending MD: Shirley FriarVincent C Valina Maes , MD ?Date of Birth: 02/21/1976 ?CSN: [id number] ?Age: [age] ?Admit Type: Outpatient ?Procedure:                Upper GI endoscopy ?Indications:              Generalized abdominal pain, Hematochezia ?Providers:                Shirley FriarVincent C. Janita Camberos, MD, Nena PolioLisa Moore, RN, Debbora PrestoJustina  ?                          Lyman BishopLawrence, Technician, Ponciano OrtGrace Brewer, CRNA ?Referring MD:             hospital team ?Medicines:                Propofol per Anesthesia, Monitored Anesthesia Care ?Complications:            No immediate complications. ?Estimated Blood Loss:     Estimated blood loss was minimal. ?Procedure:                Pre-Anesthesia Assessment: ?                          - Prior to the procedure, a History and Physical  ?                          was performed, and patient medications and  ?                          allergies were reviewed. The patient's tolerance of  ?                          previous anesthesia was also reviewed. The risks  ?                          and benefits of the procedure and the sedation  ?                          options and risks were discussed with the patient.  ?                          All questions were answered, and informed consent  ?                          was obtained. Prior Anticoagulants: The patient has  ?                          taken no previous anticoagulant or antiplatelet  ?                          agents. ASA Grade Assessment: III - A patient with  ?                          severe systemic disease. After reviewing the risks  ?  and benefits, the patient was deemed in  ?                          satisfactory condition to undergo the procedure. ?                          After obtaining informed consent, the endoscope was  ?                          passed under direct vision. Throughout the  ?                          procedure, the  patient's blood pressure, pulse, and  ?                          oxygen saturations were monitored continuously. The  ?                          [id number]-[id number] ([id number]) Olympus endoscope was introduced  ?                          through the mouth, and advanced to the second part  ?                          of duodenum. The upper GI endoscopy was  ?                          accomplished without difficulty. The patient  ?                          tolerated the procedure well. ?Scope In: ?Scope Out: ?Findings: ?     The Z-line was regular and was found 38 cm from the incisors. ?     Segmental moderate inflammation characterized by congestion (edema) and  ?     erythema was found in the gastric antrum and in the prepyloric region of  ?     the stomach. Biopsies were taken with a cold forceps for histology.  ?     Estimated blood loss was minimal. ?     Mild portal hypertensive gastropathy was found in the gastric fundus and  ?     in the gastric body. ?     The examined duodenum was normal. ?     Grade I varices were found in the distal esophagus. ?Impression:               - Z-line regular, 38 cm from the incisors. ?                          - Gastritis. Biopsied. ?                          - Portal hypertensive gastropathy. ?                          - Normal examined duodenum. ?                          -  Grade I esophageal varices. ?Moderate Sedation: ?     Not Applicable - Patient had care per Anesthesia. ?Recommendation:           - Resume previous diet. ?Procedure Code(s):        --- Professional --- ?                          [phone], Esophagogastroduodenoscopy, flexible,  ?                          transoral; with biopsy, single or multiple ?Diagnosis Code(s):        --- Professional --- ?                          R10.84, Generalized abdominal pain ?                          K92.1, Melena (includes Hematochezia) ?                          I85.00, Esophageal varices without bleeding ?                          K76.6,  Portal hypertension ?                          K29.70, Gastritis, unspecified, without bleeding ?                          K31.89, Other diseases of stomach and duodenum ?CPT copyright 2019 American Medical Association. All rights reserved. ?The codes documented in this report are preliminary and upon coder review may  ?be revised to meet current compliance requirements. ?Shirley Friar, MD ?01/26/2022 3:41:29 PM ?This report has been signed electronically. ?Number of Addenda: 0 ?

## 2022-01-26 NOTE — Transfer of Care (Signed)
Immediate Anesthesia Transfer of Care Note ? ?Patient: Aaron Mcconnell ? ?Procedure(s) Performed: COLONOSCOPY ?ESOPHAGOGASTRODUODENOSCOPY (EGD) ?BIOPSY ?POLYPECTOMY ? ?Patient Location: PACU ? ?Anesthesia Type:MAC ? ?Level of Consciousness: awake, alert , oriented and patient cooperative ? ?Airway & Oxygen Therapy: Patient Spontanous Breathing and Patient connected to nasal cannula oxygen ? ?Post-op Assessment: Report given to RN and Post -op Vital signs reviewed and stable ? ?Post vital signs: Reviewed and stable ? ?Last Vitals:  ?Vitals Value Taken Time  ?BP    ?Temp    ?Pulse    ?Resp    ?SpO2    ? ? ?Last Pain:  ?Vitals:  ? 01/26/22 1332  ?TempSrc: Temporal  ?PainSc: 0-No pain  ?   ? ?Patients Stated Pain Goal: 0 (01/25/22 0955) ? ?Complications: No notable events documented. ?

## 2022-01-26 NOTE — Anesthesia Postprocedure Evaluation (Signed)
Anesthesia Post Note ? ?Patient: Aaron Mcconnell ? ?Procedure(s) Performed: COLONOSCOPY ?ESOPHAGOGASTRODUODENOSCOPY (EGD) ?BIOPSY ?POLYPECTOMY ? ?  ? ?Patient location during evaluation: PACU ?Anesthesia Type: MAC ?Level of consciousness: awake and alert ?Pain management: pain level controlled ?Vital Signs Assessment: post-procedure vital signs reviewed and stable ?Respiratory status: spontaneous breathing, nonlabored ventilation, respiratory function stable and patient connected to nasal cannula oxygen ?Cardiovascular status: stable and blood pressure returned to baseline ?Postop Assessment: no apparent nausea or vomiting ?Anesthetic complications: no ? ? ?No notable events documented. ? ?Last Vitals:  ?Vitals:  ? 01/26/22 1536 01/26/22 1546  ?BP: 120/81 96/61  ?Pulse: 96 88  ?Resp: (!) 23 18  ?Temp: (!) 36.4 ?C   ?SpO2: 92% 92%  ?  ?Last Pain:  ?Vitals:  ? 01/26/22 1546  ?TempSrc:   ?PainSc: 0-No pain  ? ? ?  ?  ?  ?  ?  ?  ? ?Jana Swartzlander S ? ? ? ? ?

## 2022-01-27 ENCOUNTER — Encounter (HOSPITAL_COMMUNITY): Payer: Self-pay | Admitting: Gastroenterology

## 2022-01-27 LAB — TYPE AND SCREEN
ABO/RH(D): A POS
Antibody Screen: NEGATIVE
Unit division: 0
Unit division: 0

## 2022-01-27 LAB — BPAM RBC
Blood Product Expiration Date: 202304212359
Blood Product Expiration Date: 202304212359
ISSUE DATE / TIME: 202304040612
Unit Type and Rh: 6200
Unit Type and Rh: 6200

## 2022-01-30 LAB — SURGICAL PATHOLOGY

## 2022-02-23 ENCOUNTER — Observation Stay (HOSPITAL_COMMUNITY)
Admission: EM | Admit: 2022-02-23 | Discharge: 2022-02-25 | Disposition: A | Discharge status: Home / Self Care | Payer: PRIVATE HEALTH INSURANCE | Attending: Internal Medicine | Admitting: Internal Medicine

## 2022-02-23 ENCOUNTER — Encounter (HOSPITAL_COMMUNITY): Payer: Self-pay

## 2022-02-23 ENCOUNTER — Other Ambulatory Visit: Payer: Self-pay

## 2022-02-23 DIAGNOSIS — K7031 Alcoholic cirrhosis of liver with ascites: Secondary | ICD-10-CM | POA: Diagnosis not present

## 2022-02-23 DIAGNOSIS — D649 Anemia, unspecified: Secondary | ICD-10-CM | POA: Insufficient documentation

## 2022-02-23 DIAGNOSIS — F1010 Alcohol abuse, uncomplicated: Secondary | ICD-10-CM | POA: Diagnosis not present

## 2022-02-23 DIAGNOSIS — K922 Gastrointestinal hemorrhage, unspecified: Secondary | ICD-10-CM | POA: Diagnosis not present

## 2022-02-23 DIAGNOSIS — Z79899 Other long term (current) drug therapy: Secondary | ICD-10-CM | POA: Insufficient documentation

## 2022-02-23 DIAGNOSIS — Z89612 Acquired absence of left leg above knee: Secondary | ICD-10-CM

## 2022-02-23 DIAGNOSIS — R799 Abnormal finding of blood chemistry, unspecified: Secondary | ICD-10-CM | POA: Diagnosis present

## 2022-02-23 HISTORY — DX: Necrotizing fasciitis: M72.6

## 2022-02-23 HISTORY — DX: Unspecified cirrhosis of liver: K74.60

## 2022-02-23 LAB — COMPREHENSIVE METABOLIC PANEL
ALT: 18 U/L (ref 0–44)
AST: 59 U/L — ABNORMAL HIGH (ref 15–41)
Albumin: 2.2 g/dL — ABNORMAL LOW (ref 3.5–5.0)
Alkaline Phosphatase: 147 U/L — ABNORMAL HIGH (ref 38–126)
Anion gap: 7 (ref 5–15)
BUN: 7 mg/dL (ref 6–20)
CO2: 24 mmol/L (ref 22–32)
Calcium: 8.3 mg/dL — ABNORMAL LOW (ref 8.9–10.3)
Chloride: 103 mmol/L (ref 98–111)
Creatinine, Ser: 0.65 mg/dL (ref 0.61–1.24)
GFR, Estimated: >60 mL/min (ref >60)
Glucose, Bld: 113 mg/dL — ABNORMAL HIGH (ref 70–99)
Potassium: 3.6 mmol/L (ref 3.5–5.1)
Sodium: 134 mmol/L — ABNORMAL LOW (ref 135–145)
Total Bilirubin: 2.5 mg/dL — ABNORMAL HIGH (ref 0.3–1.2)
Total Protein: 6.8 g/dL (ref 6.5–8.1)

## 2022-02-23 LAB — CBC WITH DIFFERENTIAL/PLATELET
Abs Immature Granulocytes: 0.04 10*3/uL (ref 0.00–0.07)
Basophils Absolute: 0.1 10*3/uL (ref 0.0–0.1)
Basophils Relative: 1 %
Eosinophils Absolute: 0.1 10*3/uL (ref 0.0–0.5)
Eosinophils Relative: 1 %
HCT: 19.2 % — ABNORMAL LOW (ref 39.0–52.0)
Hemoglobin: 5.4 g/dL — CL (ref 13.0–17.0)
Immature Granulocytes: 0 %
Lymphocytes Relative: 15 %
Lymphs Abs: 1.6 10*3/uL (ref 0.7–4.0)
MCH: 28 pg (ref 26.0–34.0)
MCHC: 28.1 g/dL — ABNORMAL LOW (ref 30.0–36.0)
MCV: 99.5 fL (ref 80.0–100.0)
Monocytes Absolute: 0.9 10*3/uL (ref 0.1–1.0)
Monocytes Relative: 9 %
Neutro Abs: 7.7 10*3/uL (ref 1.7–7.7)
Neutrophils Relative %: 74 %
Platelets: 333 10*3/uL (ref 150–400)
RBC: 1.93 MIL/uL — ABNORMAL LOW (ref 4.22–5.81)
RDW: 17.9 % — ABNORMAL HIGH (ref 11.5–15.5)
WBC: 10.4 10*3/uL (ref 4.0–10.5)
nRBC: 0 % (ref 0.0–0.2)

## 2022-02-23 LAB — PREPARE RBC (CROSSMATCH)

## 2022-02-23 LAB — RETICULOCYTES
Immature Retic Fract: 46.8 % — ABNORMAL HIGH (ref 2.3–15.9)
RBC.: 1.9 MIL/uL — ABNORMAL LOW (ref 4.22–5.81)
Retic Count, Absolute: 126.5 10*3/uL (ref 19.0–186.0)
Retic Ct Pct: 6.7 % — ABNORMAL HIGH (ref 0.4–3.1)

## 2022-02-23 LAB — POC OCCULT BLOOD, ED: Fecal Occult Bld: POSITIVE — AB

## 2022-02-23 MED ORDER — SODIUM CHLORIDE 0.9% FLUSH
3.0000 mL | Freq: Two times a day (BID) | INTRAVENOUS | Status: DC
  Administered 2022-02-23 – 2022-02-24 (×3): 3 mL via INTRAVENOUS

## 2022-02-23 MED ORDER — ADULT MULTIVITAMIN W/MINERALS CH
1.0000 | ORAL_TABLET | Freq: Every day | ORAL | Status: DC
  Administered 2022-02-24: 1 via ORAL
  Filled 2022-02-23 (×2): qty 1

## 2022-02-23 MED ORDER — SPIRONOLACTONE 25 MG PO TABS
25.0000 mg | ORAL_TABLET | Freq: Every morning | ORAL | Status: DC
  Administered 2022-02-24 – 2022-02-25 (×2): 25 mg via ORAL
  Filled 2022-02-23 (×2): qty 1

## 2022-02-23 MED ORDER — CLONAZEPAM 0.5 MG PO TABS
0.5000 mg | ORAL_TABLET | Freq: Every day | ORAL | Status: DC
  Administered 2022-02-23: 0.5 mg via ORAL
  Filled 2022-02-23: qty 1

## 2022-02-23 MED ORDER — THIAMINE HCL 100 MG PO TABS
100.0000 mg | ORAL_TABLET | Freq: Every day | ORAL | Status: DC
  Administered 2022-02-24 – 2022-02-25 (×2): 100 mg via ORAL
  Filled 2022-02-23 (×2): qty 1

## 2022-02-23 MED ORDER — ACETAMINOPHEN 650 MG RE SUPP: 650.0000 mg | Freq: Four times a day (QID) | RECTAL | Status: DC | PRN

## 2022-02-23 MED ORDER — LACTULOSE 10 GM/15ML PO SOLN: 10.0000 g | Freq: Every evening | ORAL | Status: DC | PRN

## 2022-02-23 MED ORDER — ACETAMINOPHEN 325 MG PO TABS
650.0000 mg | ORAL_TABLET | Freq: Four times a day (QID) | ORAL | Status: DC | PRN
  Administered 2022-02-24 – 2022-02-25 (×2): 650 mg via ORAL
  Filled 2022-02-23 (×2): qty 2

## 2022-02-23 MED ORDER — SODIUM CHLORIDE 0.9 % IV SOLN: 10.0000 mL/h | Freq: Once | INTRAVENOUS | Status: DC

## 2022-02-23 MED ORDER — FUROSEMIDE 20 MG PO TABS
20.0000 mg | ORAL_TABLET | Freq: Every morning | ORAL | Status: DC
  Administered 2022-02-24 – 2022-02-25 (×2): 20 mg via ORAL
  Filled 2022-02-23 (×2): qty 1

## 2022-02-23 MED ORDER — SODIUM CHLORIDE 0.9 % IV BOLUS
1000.0000 mL | Freq: Once | INTRAVENOUS | Status: AC
  Administered 2022-02-23: 1000 mL via INTRAVENOUS

## 2022-02-23 NOTE — ED Triage Notes (Signed)
Patient was called and told to report to the ED for a Hgb-6.1. Patient had a recent colonoscopy and hospitalization. ? ?Patient also reports cirrhosis of the liver and is jaundice. Patient states no alcohol x 13 days. ?

## 2022-02-23 NOTE — ED Provider Notes (Signed)
?[facility]-EMERGENCY DEPT ?Provider Note ? ? ?CSN: [id number] ?Arrival date & time: 02/23/22  1456 ? ?  ? ?History ? ?Chief Complaint  ?Patient presents with  ? Abnormal Lab  ? ? ?Aaron Mcconnell is a 46 y.o. male.  Patient presents to the emergency department complaining of an abnormal lab value.  Patient states he was seen at his primary care earlier this week and was told that his hemoglobin was 6.1.  Patient was advised to come to [facility] for [facility].  The patient states that at the beginning of April [facility] was [facility] due to a GI bleed and was diagnosed with colitis, chronic alcohol abuse, and alcoholic cirrhosis of the liver with ascites.  Patient has not had an alcoholic drink in over 13 days.  Patient states he has noticed no blood in his stool.  During that hospitalization endoscopy and colonoscopy were performed which showed colitis but were unremarkable for acute bleed.  Hemoglobin was 8.8 at discharge.  Today, in addition to the abnormal lab value, patient complains of some weakness and shortness of breath.  Patient denies chest pain, abdominal pain, nausea, vomiting.  Denies blood in stool.  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 ? ?HPI ? ?  ? ?Home Medications ?Prior to Admission medications   ?Medication Sig Start Date End Date Taking? Authorizing Provider  ?albuterol (VENTOLIN HFA) 108 (90 Base) MCG/ACT inhaler Inhale 2 puffs into the lungs every 4 (four) hours as needed for wheezing or shortness of breath. 09/30/21   [provider]  ?Baclofen 5 MG TABS Take 5 mg by mouth 2 (two) times daily. ?Patient not taking: Reported on 01/23/2022 01/04/22   [provider]  ?Bismuth Subsalicylate (PEPTO-BISMOL PO) Take 2 tablets by mouth every 4 (four) hours as needed (nausea/vomiting).    [provider]   ?Cholecalciferol (VITAMIN D-3) 125 MCG (5000 UT) TABS Take 5,000 Units by mouth at bedtime.    [provider]  ?clonazePAM (KLONOPIN) 0.5 MG tablet Take 0.5 mg by mouth at bedtime. 12/01/21   [provider]  ?Fish Oil-Cholecalciferol (OMEGA-3 + VITAMIN D3 PO) Take 1 tablet by mouth at bedtime. Fish oil 1200 mg, Vitamin D3 2000 units    [provider]  ?folic acid (FOLVITE) 1 MG tablet Take 1 tablet (1 mg total) by mouth daily. 01/27/22   Meredeth IdeLama, Gagan S, MD  ?furosemide (LASIX) 20 MG tablet Take 20 mg by mouth every morning. 01/12/22   [provider]  ?lactulose (CHRONULAC) 10 GM/15ML solution Take 10-20 g by mouth at bedtime as needed (constipation).    [provider]  ?Multiple Vitamin (MULTIVITAMIN WITH MINERALS) TABS tablet Take 1 tablet by mouth at bedtime.    [provider]  ?Probiotic Product (ALIGN) 4 MG CAPS Take 4 mg by mouth at bedtime.    [provider]  ?spironolactone (ALDACTONE) 25 MG tablet Take 25 mg by mouth every morning. 01/12/22   [provider]  ?thiamine 100 MG tablet Take 1 tablet (100 mg total) by mouth daily. 01/27/22   Meredeth IdeLama, Gagan S, MD  ?vitamin C (ASCORBIC ACID) 500 MG tablet Take 500 mg by mouth at bedtime.    [provider]  ?   ? ?Allergies    ?Gabapentin, Neosporin [bacitracin-polymyxin b], Erythromycin, and Oxycodone   ? ?Review of Systems   ?Review of  Systems  ?Respiratory:  Positive for shortness of breath.   ?Cardiovascular:  Negative for chest pain.  ?Gastrointestinal:  Negative for abdominal pain and blood in stool.  ?Neurological:  Positive for weakness. Negative for dizziness, syncope, light-headedness and headaches.  ? ?Physical Exam ?Updated Vital Signs ?BP 115/72   Pulse (!) 106   Temp 99.8 ?F (37.7 ?C) (Oral)   Resp 18   Ht 6\' 5"  (1.956 m)   Wt 113.4 kg   SpO2 98%   BMI 29.65 kg/m?  ?Physical Exam ?Vitals and nursing note reviewed.  ?Constitutional:   ?   General: He is not in acute  distress. ?HENT:  ?   Head: Normocephalic and atraumatic.  ?Eyes:  ?   Conjunctiva/sclera: Conjunctivae normal.  ?Cardiovascular:  ?   Rate and Rhythm: Regular rhythm. Tachycardia present.  ?   Pulses: Normal pulses.  ?   Heart sounds: Normal heart sounds.  ?Pulmonary:  ?   Effort: Pulmonary effort is normal.  ?   Breath sounds: Normal breath sounds.  ?Abdominal:  ?   Palpations: Abdomen is soft.  ?   Tenderness: There is no abdominal tenderness.  ?Genitourinary: ?   Rectum: Guaiac result positive.  ?Skin: ?   General: Skin is warm and dry.  ?   Capillary Refill: Capillary refill takes less than 2 seconds.  ?   Coloration: Skin is not pale.  ?Neurological:  ?   Mental Status: He is alert and oriented to person, place, and time.  ? ? ?ED Results / Procedures / Treatments   ?Labs ?(all labs ordered are listed, but only abnormal results are displayed) ?Labs Reviewed  ?COMPREHENSIVE METABOLIC PANEL  ?CBC WITH DIFFERENTIAL/PLATELET  ?OCCULT BLOOD X 1 CARD TO LAB, STOOL  ?TYPE AND SCREEN  ? ? ?EKG ?None ? ?Radiology ?No results found. ? ?Procedures ? Critical Care ?Performed by: Marland Kitchen, PA-C ?Authorized by: Darrick Grinder, PA-C  ? ?Critical care provider statement:  ?  Critical care time (minutes):  30 ?  Critical care time was exclusive of:  Separately billable procedures and treating other patients ?  Critical care was necessary to treat or prevent imminent or life-threatening deterioration of the following conditions:  Shock ?  Critical care was time spent personally by me on the following activities:  Development of treatment plan with patient or surrogate, discussions with consultants, evaluation of patient's response to treatment, examination of patient, ordering and review of laboratory studies, ordering and review of radiographic studies, ordering and performing treatments and interventions, pulse oximetry, re-evaluation of patient's condition and review of old charts ?  Care discussed with:  admitting provider    ? ? ?Medications Ordered in ED ?Medications  ?sodium chloride 0.9 % bolus 1,000 mL (has no administration in time range)  ? ? ?ED Course/ Medical Decision Making/ A&P ?  ?                        ?Medical Decision Making ?Amount and/or Complexity of Data Reviewed ?Labs: ordered. ? ? ?This patient presents to the ED for concern of low hemoglobin, this involves an extensive number of treatment options, and is a complaint that carries with it a high risk of complications and morbidity.  The differential diagnosis includes GI bleed, iron deficiency, vitamin B12 or folate deficiency, liver disease, and others ? ? ?Co morbidities that complicate the patient evaluation ? ?History of colitis, GI bleed, cirrhosis of liver ? ? ?Additional history obtained: ? ?  Additional history obtained from patient's relative in room ?External records from outside source obtained and reviewed including discharge summary from recent hospitalization dated January 26, 2022 ? ? ?Lab Tests: ? ?I Ordered, and personally interpreted labs.  The pertinent results include: Hemoglobin 5.4, fecal occult blood positive, albumin 2.2 ? ? ? ?Cardiac Monitoring: / EKG: ? ?The patient was maintained on a cardiac monitor.  I personally viewed and interpreted the cardiac monitored which showed an underlying rhythm of: Sinus tach ? ? ?Consultations Obtained: ? ?I requested consultation with the [facility] gastroenterologist on-call, Dr.Magod,  who has added the patient to the team's rounding list ?I also requested consultation with the hospitalist on-call to discuss lab and imaging findings as well as pertinent plan, and they recommend admission ? ? ?Problem List / ED Course / Critical interventions / Medication management ? ?I ordered medication including saline for hydration, 2 units PRBC for symptomatic anemia  ?Reevaluation of the patient after these medicines showed that the patient improved ?I have reviewed the patients home medicines and have  made adjustments as needed ? ?Test / Admission - Considered: ? ?The patient has an active GI bleed with symptomatic anemia.  Hemoglobin is critically low.  The patient needs admission for continued observation, evaluation

## 2022-02-23 NOTE — H&P (Signed)
?History and Physical  ? ?[name]:[id number] DOB: June 19, 1976 DOA: 02/23/2022 ? ?PCP: Kennieth Rad, MD  ? ?Patient coming from: Home ? ?Chief Complaint: Abnormal labs ? ?HPI: Aaron Mcconnell is a 46 y.o. male with medical history significant of alcohol use, cirrhosis, necrotizing fasciitis status post left BKA presenting with abnormal lab, low hemoglobin. ? ?Patient presenting after PCP checked his hemoglobin and found to be 6.1.  Was referred to the ED at that time.  Was recently admitted at the beginning of April found to have colitis and new cirrhosis as well as some GI bleeding.  Upper endoscopy at that time showed gastritis, varices, portal gastropathy but no active bleed, lower endoscopy also did not show source of bleeding but noted to have an adequate prep and did demonstrate internal hemorrhoids and diverticulosis as well as 1 polyp. ? ?He states he has not been drinking for the past 13 days.  He does report some weakness and shortness of breath as well but states this has been going on since his discharge in April.  He states he has not noticed any dark or bloody stools.  He denies any fevers, chills, chest pain, abdominal pain, constipation, diarrhea, nausea, vomiting. ? ?ED Course: Vital signs in the ED significant for heart rate in the 100s, blood pressure in the 100s to 110s.  Lab work-up included CMP with sodium 134, glucose 113, calcium 8.3, albumin 2.2, AST stable at 59, alk phos 147, T. bili 2.5.  CBC with hemoglobin of 5.4 down from 6.1 on yesterday's labs and 8.8 at discharge from previous admission.  FOBT was positive in the ED.  Patient was typed and screened.  GI was consulted and stated they will see the patient.  Patient received a liter of fluids and 2 units of packed red blood cells have been ordered. ? ?Review of Systems: As per HPI otherwise all other systems reviewed and are negative. ? ?Past Medical History:  ?Diagnosis Date  ? Cirrhosis of liver ([place])   ? ETOH abuse    ? ? ?Past Surgical History:  ?Procedure Laterality Date  ? AMPUTATION Left 01/18/2018  ? Procedure: AMPUTATION BELOW KNEE LEFT;  Surgeon: Newt Minion, MD;  Location: [facility];  Service: Orthopedics;  Laterality: Left;  ? AMPUTATION Left 01/23/2018  ? Procedure: LEFT ABOVE KNEE AMPUTATION;  Surgeon: Newt Minion, MD;  Location: [place];  Service: Orthopedics;  Laterality: Left;  ? APPLICATION OF WOUND VAC Left 01/07/2018  ? Procedure: APPLICATION OF WOUND VAC;  Surgeon: Shona Needles, MD;  Location: [place];  Service: Orthopedics;  Laterality: Left;  ? APPLICATION OF WOUND VAC Left 01/09/2018  ? Procedure: APPLICATION OF WOUND VAC;  Surgeon: Newt Minion, MD;  Location: [facility];  Service: Orthopedics;  Laterality: Left;  ? BIOPSY  01/26/2022  ? Procedure: BIOPSY;  Surgeon: Wilford Corner, MD;  Location: [facility];  Service: Gastroenterology;;  ? COLONOSCOPY N/A 01/26/2022  ? Procedure: COLONOSCOPY;  Surgeon: Wilford Corner, MD;  Location: [facility];  Service: Gastroenterology;  Laterality: N/A;  ? ESOPHAGOGASTRODUODENOSCOPY N/A 01/26/2022  ? Procedure: ESOPHAGOGASTRODUODENOSCOPY (EGD);  Surgeon: Wilford Corner, MD;  Location: [facility];  Service: Gastroenterology;  Laterality: N/A;  ? I & D EXTREMITY Left 01/05/2018  ? Procedure: IRRIGATION AND DEBRIDEMENT EXTREMITY;  Surgeon: Shona Needles, MD;  Location: [place];  Service: Orthopedics;  Laterality: Left;  ? I & D EXTREMITY Left 01/07/2018  ? Procedure: IRRIGATION AND DEBRIDEMENT EXTREMITY;  Surgeon: Shona Needles,  MD;  Location: [place];  Service: Orthopedics;  Laterality: Left;  ? I & D EXTREMITY Left 01/09/2018  ? Procedure: IRRIGATION AND DEBRIDEMENT LEFT LEG;  Surgeon: Newt Minion, MD;  Location: [place] [facility];  Service: Orthopedics;  Laterality: Left;  ? I & D EXTREMITY Left 01/11/2018  ? Procedure: REPEAT IRRIGATION AND DEBRIDEMENT LEFT LEG, APPLY WOUND VAC;  Surgeon: Newt Minion, MD;  Location: [place];  Service: Orthopedics;  Laterality: Left;  ? I & D  EXTREMITY Left 01/16/2018  ? Procedure: REPEAT IRRIGATION AND DEBRIDEMENT LEFT LEG, APPLY WOUND VAC;  Surgeon: Newt Minion, MD;  Location: [place];  Service: Orthopedics;  Laterality: Left;  ? POLYPECTOMY  01/26/2022  ? Procedure: POLYPECTOMY;  Surgeon: Wilford Corner, MD;  Location: [facility];  Service: Gastroenterology;;  ? ? ?Social History ? reports that he has never smoked. He has never used smokeless tobacco. He reports that he does not currently use alcohol after a past usage of about 3.0 standard drinks per week. He reports that he does not use drugs. ? ?Allergies  ?Allergen Reactions  ? Gabapentin Other (See Comments)  ?  Bad dreams/skin crawling  ? Neosporin [Bacitracin-Polymyxin B] Other (See Comments)  ?  Arm turned completely pinkish/red  ? Erythromycin Itching  ? Oxycodone Itching  ? ? ?Family History  ?Problem Relation Age of Onset  ? Diabetes Mellitus II Mother   ?Reviewed on admission ? ?Prior to Admission medications   ?Medication Sig Start Date End Date Taking? Authorizing Provider  ?albuterol (VENTOLIN HFA) 108 (90 Base) MCG/ACT inhaler Inhale 2 puffs into the lungs every 4 (four) hours as needed for wheezing or shortness of breath. 09/30/21   [provider]  ?Baclofen 5 MG TABS Take 5 mg by mouth 2 (two) times daily. ?Patient not taking: Reported on 01/23/2022 01/04/22   [provider]  ?Bismuth Subsalicylate (PEPTO-BISMOL PO) Take 2 tablets by mouth every 4 (four) hours as needed (nausea/vomiting).    [provider]  ?Cholecalciferol (VITAMIN D-3) 125 MCG (5000 UT) TABS Take 5,000 Units by mouth at bedtime.    [provider]  ?clonazePAM (KLONOPIN) 0.5 MG tablet Take 0.5 mg by mouth at bedtime. 12/01/21   [provider]  ?Fish Oil-Cholecalciferol (OMEGA-3 + VITAMIN D3 PO) Take 1 tablet by mouth at bedtime. Fish oil 1200 mg, Vitamin D3 2000 units    [provider]  ?folic acid (FOLVITE) 1 MG tablet Take 1 tablet (1 mg total) by mouth  daily. 01/27/22   [name], MD  ?furosemide (LASIX) 20 MG tablet Take 20 mg by mouth every morning. 01/12/22   [provider]  ?lactulose (CHRONULAC) 10 GM/15ML solution Take 10-20 g by mouth at bedtime as needed (constipation).    [provider]  ?Multiple Vitamin (MULTIVITAMIN WITH MINERALS) TABS tablet Take 1 tablet by mouth at bedtime.    [provider]  ?Probiotic Product (ALIGN) 4 MG CAPS Take 4 mg by mouth at bedtime.    [provider]  ?spironolactone (ALDACTONE) 25 MG tablet Take 25 mg by mouth every morning. 01/12/22   [provider]  ?thiamine 100 MG tablet Take 1 tablet (100 mg total) by mouth daily. 01/27/22   [name], MD  ?vitamin C (ASCORBIC ACID) 500 MG tablet Take 500 mg by mouth at bedtime.    [provider]  ? ? ?Physical Exam: ?Vitals:  ? 02/23/22 1700 02/23/22 1731 02/23/22 1800 02/23/22 1830  ?BP: 110/72 109/66  102/72 98/64  ?Pulse: 100 (!) 101 (!) 101 100  ?Resp: 19 15 (!) 23 18  ?Temp:      ?TempSrc:      ?SpO2: 100% 100% 99% 100%  ?Weight:      ?Height:      ? ? ?Physical Exam ?Constitutional:   ?   General: He is not in acute distress. ?   Appearance: Normal appearance.  ?HENT:  ?   Head: Normocephalic and atraumatic.  ?   Mouth/Throat:  ?   Mouth: Mucous membranes are moist.  ?   Pharynx: Oropharynx is clear.  ?Eyes:  ?   Extraocular Movements: Extraocular movements intact.  ?   Pupils: Pupils are equal, round, and reactive to light.  ?Cardiovascular:  ?   Rate and Rhythm: Normal rate and regular rhythm.  ?   Pulses: Normal pulses.  ?   Heart sounds: Normal heart sounds.  ?Pulmonary:  ?   Effort: Pulmonary effort is normal. No respiratory distress.  ?   Breath sounds: Normal breath sounds.  ?Abdominal:  ?   General: Bowel sounds are normal. There is no distension.  ?   Palpations: Abdomen is soft.  ?   Tenderness: There is no abdominal tenderness.  ?Musculoskeletal:     ?   General: No swelling or deformity.  ?   Right lower  leg: Edema present.  ?   Comments: Status post left BKA  ?Skin: ?   General: Skin is warm and dry.  ?   Coloration: Skin is pale.  ?Neurological:  ?   General: No focal deficit present.  ?   Mental Status: Mental

## 2022-02-24 ENCOUNTER — Observation Stay (HOSPITAL_COMMUNITY): Payer: PRIVATE HEALTH INSURANCE

## 2022-02-24 ENCOUNTER — Encounter (HOSPITAL_COMMUNITY): Payer: Self-pay | Admitting: Internal Medicine

## 2022-02-24 DIAGNOSIS — K922 Gastrointestinal hemorrhage, unspecified: Secondary | ICD-10-CM | POA: Diagnosis not present

## 2022-02-24 DIAGNOSIS — D649 Anemia, unspecified: Secondary | ICD-10-CM | POA: Diagnosis not present

## 2022-02-24 DIAGNOSIS — Z89612 Acquired absence of left leg above knee: Secondary | ICD-10-CM

## 2022-02-24 DIAGNOSIS — F1010 Alcohol abuse, uncomplicated: Secondary | ICD-10-CM | POA: Diagnosis not present

## 2022-02-24 DIAGNOSIS — K7031 Alcoholic cirrhosis of liver with ascites: Secondary | ICD-10-CM | POA: Diagnosis not present

## 2022-02-24 HISTORY — DX: Acquired absence of left leg above knee: Z89.612

## 2022-02-24 LAB — CBC WITH DIFFERENTIAL/PLATELET
Abs Immature Granulocytes: 0.03 10*3/uL (ref 0.00–0.07)
Basophils Absolute: 0.1 10*3/uL (ref 0.0–0.1)
Basophils Relative: 1 %
Eosinophils Absolute: 0.1 10*3/uL (ref 0.0–0.5)
Eosinophils Relative: 1 %
HCT: 22.6 % — ABNORMAL LOW (ref 39.0–52.0)
Hemoglobin: 7.1 g/dL — ABNORMAL LOW (ref 13.0–17.0)
Immature Granulocytes: 0 %
Lymphocytes Relative: 15 %
Lymphs Abs: 1.3 10*3/uL (ref 0.7–4.0)
MCH: 29.7 pg (ref 26.0–34.0)
MCHC: 31.4 g/dL (ref 30.0–36.0)
MCV: 94.6 fL (ref 80.0–100.0)
Monocytes Absolute: 0.7 10*3/uL (ref 0.1–1.0)
Monocytes Relative: 8 %
Neutro Abs: 6.3 10*3/uL (ref 1.7–7.7)
Neutrophils Relative %: 75 %
Platelets: 291 10*3/uL (ref 150–400)
RBC: 2.39 MIL/uL — ABNORMAL LOW (ref 4.22–5.81)
RDW: 18.8 % — ABNORMAL HIGH (ref 11.5–15.5)
WBC: 8.5 10*3/uL (ref 4.0–10.5)
nRBC: 0 % (ref 0.0–0.2)

## 2022-02-24 LAB — TYPE AND SCREEN
ABO/RH(D): A POS
Antibody Screen: POSITIVE
DAT, IgG: NEGATIVE
Donor AG Type: NEGATIVE
Donor AG Type: NEGATIVE
PT AG Type: NEGATIVE
Unit division: 0
Unit division: 0

## 2022-02-24 LAB — HEMOGLOBIN AND HEMATOCRIT, BLOOD
HCT: 21.7 % — ABNORMAL LOW (ref 39.0–52.0)
Hemoglobin: 7.2 g/dL — ABNORMAL LOW (ref 13.0–17.0)

## 2022-02-24 LAB — BPAM RBC
Blood Product Expiration Date: 202305092359
Blood Product Expiration Date: 202305152359
ISSUE DATE / TIME: 202305042027
ISSUE DATE / TIME: 202305042352
Unit Type and Rh: 6200
Unit Type and Rh: 6200

## 2022-02-24 LAB — COMPREHENSIVE METABOLIC PANEL
ALT: 15 U/L (ref 0–44)
AST: 53 U/L — ABNORMAL HIGH (ref 15–41)
Albumin: 2 g/dL — ABNORMAL LOW (ref 3.5–5.0)
Alkaline Phosphatase: 120 U/L (ref 38–126)
Anion gap: 7 (ref 5–15)
BUN: 6 mg/dL (ref 6–20)
CO2: 24 mmol/L (ref 22–32)
Calcium: 7.9 mg/dL — ABNORMAL LOW (ref 8.9–10.3)
Chloride: 105 mmol/L (ref 98–111)
Creatinine, Ser: 0.57 mg/dL — ABNORMAL LOW (ref 0.61–1.24)
GFR, Estimated: >60 mL/min (ref >60)
Glucose, Bld: 90 mg/dL (ref 70–99)
Potassium: 3.4 mmol/L — ABNORMAL LOW (ref 3.5–5.1)
Sodium: 136 mmol/L (ref 135–145)
Total Bilirubin: 3.4 mg/dL — ABNORMAL HIGH (ref 0.3–1.2)
Total Protein: 5.9 g/dL — ABNORMAL LOW (ref 6.5–8.1)

## 2022-02-24 LAB — CBC
HCT: 22.4 % — ABNORMAL LOW (ref 39.0–52.0)
Hemoglobin: 6.9 g/dL — CL (ref 13.0–17.0)
MCH: 30.5 pg (ref 26.0–34.0)
MCHC: 30.8 g/dL (ref 30.0–36.0)
MCV: 99.1 fL (ref 80.0–100.0)
Platelets: 286 10*3/uL (ref 150–400)
RBC: 2.26 MIL/uL — ABNORMAL LOW (ref 4.22–5.81)
RDW: 20.2 % — ABNORMAL HIGH (ref 11.5–15.5)
WBC: 7.5 10*3/uL (ref 4.0–10.5)
nRBC: 0 % (ref 0.0–0.2)

## 2022-02-24 LAB — IRON AND TIBC
Iron: 63 ug/dL (ref 45–182)
Saturation Ratios: 56 % — ABNORMAL HIGH (ref 17.9–39.5)
TIBC: 113 ug/dL — ABNORMAL LOW (ref 250–450)
UIBC: 50 ug/dL

## 2022-02-24 LAB — OCCULT BLOOD, POC DEVICE: Fecal Occult Bld: POSITIVE — AB

## 2022-02-24 LAB — FERRITIN: Ferritin: 46 ng/mL (ref 24–336)

## 2022-02-24 MED ORDER — ENSURE ENLIVE PO LIQD
237.0000 mL | ORAL | Status: DC
  Administered 2022-02-25: 237 mL via ORAL

## 2022-02-24 MED ORDER — SODIUM CHLORIDE (PF) 0.9 % IJ SOLN
INTRAMUSCULAR | Status: AC
  Filled 2022-02-24: qty 50

## 2022-02-24 MED ORDER — IOHEXOL 300 MG/ML  SOLN
100.0000 mL | Freq: Once | INTRAMUSCULAR | Status: AC | PRN
  Administered 2022-02-24: 100 mL via INTRAVENOUS

## 2022-02-24 MED ORDER — CLONAZEPAM 0.5 MG PO TABS: 0.5000 mg | ORAL_TABLET | Freq: Two times a day (BID) | ORAL | Status: DC

## 2022-02-24 MED ORDER — POTASSIUM CHLORIDE CRYS ER 20 MEQ PO TBCR
40.0000 meq | EXTENDED_RELEASE_TABLET | Freq: Every day | ORAL | Status: DC
  Administered 2022-02-24 – 2022-02-25 (×2): 40 meq via ORAL
  Filled 2022-02-24 (×2): qty 2

## 2022-02-24 MED ORDER — CLONAZEPAM 0.5 MG PO TABS
0.5000 mg | ORAL_TABLET | Freq: Two times a day (BID) | ORAL | Status: DC
  Administered 2022-02-24 – 2022-02-25 (×3): 0.5 mg via ORAL
  Filled 2022-02-24 (×3): qty 1

## 2022-02-24 MED ORDER — IOHEXOL 9 MG/ML PO SOLN: 500.0000 mL | ORAL | Status: AC

## 2022-02-24 MED ORDER — FOLIC ACID 1 MG PO TABS
1.0000 mg | ORAL_TABLET | Freq: Every day | ORAL | Status: DC
  Administered 2022-02-24 – 2022-02-25 (×2): 1 mg via ORAL
  Filled 2022-02-24 (×2): qty 1

## 2022-02-24 NOTE — Progress Notes (Signed)
Initial Nutrition Assessment ? ?DOCUMENTATION CODES:  ? ?Non-severe (moderate) malnutrition in context of chronic illness ? ?INTERVENTION:  ?- will order Ensure Plus High Protein once/day, each supplement provides 350 kcal and 20 grams of protein. ? ? ?NUTRITION DIAGNOSIS:  ? ?Moderate Malnutrition related to chronic illness (cirrhosis) as evidenced by mild fat depletion, mild muscle depletion. ? ?GOAL:  ? ?Patient will meet greater than or equal to 90% of their needs ? ?MONITOR:  ? ?PO intake, Supplement acceptance, Labs, Weight trends ? ?REASON FOR ASSESSMENT:  ? ?Malnutrition Screening Tool ? ?ASSESSMENT:  ? ?46 y.o. male with medical history of alcohol use, cirrhosis, and necrotizing fasciitis s/p L AKA. He presented to the ED due to abnormal lab (low Hgb OF 6.1). He was hospitalized a month ago and found to have colitis, new dx of cirrhosis, and GI bleeding. EGD at that time showed gastritis, varices, portal gastropathy but no active bleed, lower endoscopy showed internal hemorrhoids and diverticulosis and 1 polyp. Patient reported not drinking x13 days PTA. He was admitted for GI bleed. ? ?Patient laying in bed with mom at bedside.  ? ?Patient shares that he lives alone and that he does not eat breakfast but does start his day with an Ensure, Boost, or similar. The next time he has nutrition is at lunch at work where the meal is often take out from a variety of restaurants. He has been experiencing early satiety for several months so he is often only able to eat a small portion of his lunch before feeling too full. He will take the meal home and try to eat the remainder for dinner or use it to make a salad as he enjoys salads. His mom often purchases fruit for him and encourages fruit intake. He mainly drinks water. ? ?He has not noticed any foods that cause early satiety to occur more quickly or that cause increased abdominal pain. ? ?We discussed drinking fluids separate from meals and RD encouraged him to  increase Ensure (or similar) intake to BID. ? ?He is currently pending CT with contrast and is hopeful to go home tomorrow.  ? ?Weight today is 219 lb and weight on 4/3 was 233 lb. This indicates 14 lb weight loss (6% body weight) in the past 1 month. ? ?Mom shares she has been able to see in his face and upper body that he has lost weight. Patient does not weigh himself at home.  ? ? ?Labs reviewed; K: 3.4 mmol/l, creatinine: 0.57 mg/dl, Ca: 7.9 mg/dl. ? ?Medications reviewed; 1 mg folvite/day, 20 mg oral lasix/day, 40 mEq Klor-Con/day, 25 mg aldactone/day, 100 mg thiamine/day.  ?  ? ?NUTRITION - FOCUSED PHYSICAL EXAM: ? ?Flowsheet Row Most Recent Value  ?Orbital Region No depletion  ?Upper Arm Region Moderate depletion  ?Thoracic and Lumbar Region Unable to assess  ?Buccal Region Mild depletion  ?Temple Region No depletion  ?Clavicle Bone Region Mild depletion  ?Clavicle and Acromion Bone Region Mild depletion  ?Scapular Bone Region Mild depletion  ?Dorsal Hand Moderate depletion  ?Patellar Region No depletion  [on RLE]  ?Anterior Thigh Region No depletion  ?Posterior Calf Region No depletion  [on RLE]  ?Edema (RD Assessment) Moderate  [RLE]  ?Hair Reviewed  ?Eyes Reviewed  ?Mouth Reviewed  ?Skin Reviewed  ?Nails Reviewed  ? ?  ? ? ?Diet Order:   ?Diet Order   ? ?       ?  DIET SOFT Room service appropriate? Yes; Fluid consistency: Thin  Diet effective now       ?  ? ?  ?  ? ?  ? ? ?EDUCATION NEEDS:  ? ?Education needs have been addressed ? ?Skin:  Skin Assessment: Reviewed RN Assessment ? ?Last BM:  PTA/unknown ? ?Height:  ? ?Ht Readings from Last 1 Encounters:  ?02/23/22 6\' 5"  (1.956 m)  ? ? ?Weight:  ? ?Wt Readings from Last 1 Encounters:  ?02/24/22 99.3 kg  ? ? ? ?BMI:  Body mass index is 25.96 kg/m?. ? ?Estimated Nutritional Needs:  ?Kcal:  2300-2500 kcal ?Protein:  115-125 grams ?Fluid:  >/= 2 L/day ? ? ? ? ?04/26/22, MS, RD, LDN ?Registered Dietitian II ?Inpatient Clinical Nutrition ?RD pager # and  on-call/weekend pager # available in [facility]ION  ? ?

## 2022-02-24 NOTE — Assessment & Plan Note (Signed)
Pt states he has stopped alcohol. Confirmed by his mother. ?

## 2022-02-24 NOTE — Progress Notes (Signed)
?PROGRESS NOTE ? ? ? ?Aaron Mcconnell  GYB:[id number] DOB: April 13, 1976 DOA: 02/23/2022 ?PCP: Kennieth Rad, MD ? ?Brief Narrative:  ?HPI: Aaron Mcconnell is a 46 y.o. male with medical history significant of alcohol use, cirrhosis, necrotizing fasciitis status post left BKA presenting with abnormal lab, low hemoglobin. ? ?Patient presenting after PCP checked his hemoglobin and found to be 6.1.  Was referred to the ED at that time.  Was recently admitted at the beginning of April found to have colitis and new cirrhosis as well as some GI bleeding.  Upper endoscopy at that time showed gastritis, varices, portal gastropathy but no active bleed, lower endoscopy also did not show source of bleeding but noted to have an adequate prep and did demonstrate internal hemorrhoids and diverticulosis as well as 1 polyp. ? ?He states he has not been drinking for the past 13 days.  He does report some weakness and shortness of breath as well but states this has been going on since his discharge in April.  He states he has not noticed any dark or bloody stools.  He denies any fevers, chills, chest pain, abdominal pain, constipation, diarrhea, nausea, vomiting. ?  ?ED Course: Vital signs in the ED significant for heart rate in the 100s, blood pressure in the 100s to 110s.  Lab work-up included CMP with sodium 134, glucose 113, calcium 8.3, albumin 2.2, AST stable at 59, alk phos 147, T. bili 2.5.  CBC with hemoglobin of 5.4 down from 6.1 on yesterday's labs and 8.8 at discharge from previous admission.  FOBT was positive in the ED.  Patient was typed and screened.  GI was consulted and stated they will see the patient.  Patient received a liter of fluids and 2 units of packed red blood cells have been ordered. ? ?Hospital Course: ?02-23-2022: admitted. Transfused with 2 units PRBC ? ?02-24-2022: GI consulted. No further endoscopy recommended. ? ?Principal Problem: ?  GI bleed ?Active Problems: ?  Alcoholic cirrhosis of liver with ascites  ([place]) ?  Normocytic anemia ?  Chronic alcohol abuse ?  S/P AKA (above knee amputation) unilateral, left ([place]) ? ?  ?Assessment and Plan: ?* GI bleed ?Seen by GI consult. They do not think pt's anemia related to GI bleeding. They recommend no need for repeat endo. ? ?Normocytic anemia ?S/p 2 unit PRBC. Repeat HgB 7.2.  Was 5.4 prior to transfusion. Has had expected response to 2 units. ? ?Alcoholic cirrhosis of liver with ascites ([place]) ?Gi think that anemia is due to splenic sequestration vs portal hypertension. I agree with this thought process. Pt without any [facility] within [facility]. He thinks he may have had some imaging in [place], [place] but not sure. Will get CT abd/pelvis today to evaluate his liver and spleen.  Don't think that inpatient hemtology consult is needed at this point.  Would recommend another HgB next week(Wednesday) in PCP office. If drops again, PCP can arrange for outpatient PRBC transfusion.  If he continues to need PRBC transfusion and has splenomegaly. Then I think at that point a hematology +/- general surgery consult would be in order.   Not sure what hematology opinion at this juncture would do. ? ?S/P AKA (above knee amputation) unilateral, left ([place]) ?Chronic. ? ?Chronic alcohol abuse ?Pt states he has stopped alcohol. Confirmed by his mother. ? ? ? ?DVT prophylaxis: SCDs ?  Code Status: Full Code ?Family Communication: discussed with pt and his mother at bedside ?Disposition Plan: return home ? ?Consultants:  ?  GI ? ?Procedures:  ?none ? ?Antimicrobials:  ?none  ? ? ?Subjective: ?Stable overnight. Feels the same. HgB up to 7.2 after 2 units PRBC. GI consult has seen the patient. No further endoscopy recommended. ? ?Objective: ?Vitals:  ? 02/24/22 0344 02/24/22 0500 02/24/22 0612 02/24/22 1009  ?BP: 107/72  110/63 104/68  ?Pulse: 81  80 95  ?Resp: _0 ?Temp: 98.4 ?F (36.9 ?C)  98.1 ?F (36.7 ?C) 98.9 ?F (37.2 ?C)  ?TempSrc: Oral  Oral Oral  ?SpO2: 100%  100% 97%  ?Weight:  99.3  kg    ?Height:      ? ? ?Intake/Output Summary (Last 24 hours) at 02/24/2022 1300 ?Last data filed at 02/24/2022 1024 ?Gross per 24 hour  ?Intake 2685 ml  ?Output 700 ml  ?Net 1985 ml  ? ?Filed Weights  ? 02/23/22 1513 02/23/22 2235 02/24/22 0500  ?Weight: 113.4 kg 99.3 kg 99.3 kg  ? ? ?Examination: ? ?Physical Exam ?Vitals and nursing note reviewed.  ?Constitutional:   ?   General: He is not in acute distress. ?   Appearance: Normal appearance. He is not toxic-appearing or diaphoretic.  ?   Comments: Appears chronically ill  ?HENT:  ?   Head: Normocephalic and atraumatic.  ?   Nose: Nose normal.  ?Eyes:  ?   General: No scleral icterus. ?Cardiovascular:  ?   Rate and Rhythm: Normal rate and regular rhythm.  ?Pulmonary:  ?   Effort: Pulmonary effort is normal.  ?   Breath sounds: Normal breath sounds.  ?Abdominal:  ?   General: Abdomen is protuberant. There is no distension.  ?   Palpations: Abdomen is soft.  ?   Tenderness: There is no abdominal tenderness. There is no guarding.  ?   Comments: ??hepatomegaly ???splenomegayl ? ?No overt fluid shifts  ?Musculoskeletal:  ?   Comments: Left AKA  ?Skin: ?   Capillary Refill: Capillary refill takes less than 2 seconds.  ?Neurological:  ?   General: No focal deficit present.  ?   Mental Status: He is alert and oriented to person, place, and time.  ? ? ?Data Reviewed: I have personally reviewed following labs and imaging studies ? ?CBC: ?Recent Labs  ?Lab 02/23/22 ?1601 02/24/22 ?0448 02/24/22 ?1121 02/24/22 ?1124  ?WBC 10.4 7.5 8.5  --   ?NEUTROABS 7.7  --  6.3  --   ?HGB 5.4* 6.9* 7.1* 7.2*  ?HCT 19.2* 22.4* 22.6* 21.7*  ?MCV 99.5 99.1 94.6  --   ?PLT 333 286 291  --   ? ?Basic Metabolic Panel: ?Recent Labs  ?Lab 02/23/22 ?1601 02/24/22 ?0448  ?NA 134* 136  ?K 3.6 3.4*  ?CL 103 105  ?CO2 24 24  ?GLUCOSE 113* 90  ?BUN 7 6  ?CREATININE 0.65 0.57*  ?CALCIUM 8.3* 7.9*  ? ?GFR: ?Estimated Creatinine Clearance: 148.5 mL/min (A) (by C-G formula based on SCr of 0.57 mg/dL  (L)). ?Liver Function Tests: ?Recent Labs  ?Lab 02/23/22 ?1601 02/24/22 ?0448  ?AST 59* 53*  ?ALT 18 15  ?ALKPHOS 147* 120  ?BILITOT 2.5* 3.4*  ?PROT 6.8 5.9*  ?ALBUMIN 2.2* 2.0*  ? ?No results for input(s): LIPASE, AMYLASE in the last 168 hours. ?No results for input(s): AMMONIA in the last 168 hours. ?Coagulation Profile: ?No results for input(s): INR, PROTIME in the last 168 hours. ?Cardiac Enzymes: ?No results for input(s): CKTOTAL, CKMB, CKMBINDEX, TROPONINI in the last 168 hours. ?BNP (last 3 results) ?No results for input(s): PROBNP in  the last 8760 hours. ?HbA1C: ?No results for input(s): HGBA1C in the last 72 hours. ?CBG: ?No results for input(s): GLUCAP in the last 168 hours. ?Lipid Profile: ?No results for input(s): CHOL, HDL, LDLCALC, TRIG, CHOLHDL, LDLDIRECT in the last 72 hours. ?Thyroid Function Tests: ?No results for input(s): TSH, T4TOTAL, FREET4, T3FREE, THYROIDAB in the last 72 hours. ?Anemia Panel: ?Recent Labs  ?  02/23/22 ?1601 02/24/22 ?0448  ?FERRITIN  --  46  ?TIBC  --  113*  ?IRON  --  63  ?RETICCTPCT 6.7*  --   ? ?Sepsis Labs: ?No results for input(s): PROCALCITON, LATICACIDVEN in the last 168 hours. ? ?No results found for this or any previous visit (from the past 240 hour(s)).  ? ?Radiology Studies: ?No results found. ? ? ?Scheduled Meds: ? clonazePAM  0.5 mg Oral BID  ? folic acid  1 mg Oral Daily  ? furosemide  20 mg Oral q morning  ? iohexol  500 mL Oral Q1H  ? multivitamin with minerals  1 tablet Oral QHS  ? sodium chloride flush  3 mL Intravenous Q12H  ? spironolactone  25 mg Oral q morning  ? thiamine  100 mg Oral Daily  ? ?Continuous Infusions: ? sodium chloride    ? ? ? LOS: 0 days  ? ? ?Time spent: 40 minutes ? ? ? ?Kristopher Oppenheim, DO ? ?[facility] ? ?02/24/2022, 1:00 PM  ? ?

## 2022-02-24 NOTE — Progress Notes (Signed)
?  Transition of Care (TOC) Screening Note ? ? ?Patient Details  ?Name: Aaron Mcconnell ?Date of Birth: Jan 12, 1976 ? ? ?Transition of Care [facility]) CM/SW Contact:    ?Lanier Clam, RN ?Phone Number: ?02/24/2022, 3:40 PM ? ? ? ?Transition of Care Department [facility]) has reviewed patient and no TOC needs have been identified at this time. We will continue to monitor patient advancement through interdisciplinary progression rounds. If new patient transition needs arise, please place a TOC consult. ?  ?

## 2022-02-24 NOTE — Assessment & Plan Note (Signed)
S/p 2 unit PRBC. Repeat HgB 7.2.  Was 5.4 prior to transfusion. Has had expected response to 2 units. ?

## 2022-02-24 NOTE — Assessment & Plan Note (Signed)
Seen by GI consult. They do not think pt's anemia related to GI bleeding. They recommend no need for repeat endo. ?

## 2022-02-24 NOTE — Hospital Course (Signed)
02-23-2022: admitted. Transfused with 2 units PRBC ? ?02-24-2022: GI consulted. No further endoscopy recommended. ?

## 2022-02-24 NOTE — Assessment & Plan Note (Signed)
Chronic. 

## 2022-02-24 NOTE — Subjective & Objective (Signed)
HPI: Aaron Mcconnell is a 46 y.o. male with medical history significant of alcohol use, cirrhosis, necrotizing fasciitis status post left BKA presenting with abnormal lab, low hemoglobin. ? ?Patient presenting after PCP checked his hemoglobin and found to be 6.1.  Was referred to the ED at that time.  Was recently admitted at the beginning of April found to have colitis and new cirrhosis as well as some GI bleeding.  Upper endoscopy at that time showed gastritis, varices, portal gastropathy but no active bleed, lower endoscopy also did not show source of bleeding but noted to have an adequate prep and did demonstrate internal hemorrhoids and diverticulosis as well as 1 polyp. ? ?He states he has not been drinking for the past 13 days.  He does report some weakness and shortness of breath as well but states this has been going on since his discharge in April.  He states he has not noticed any dark or bloody stools.  He denies any fevers, chills, chest pain, abdominal pain, constipation, diarrhea, nausea, vomiting. ?  ?ED Course: Vital signs in the ED significant for heart rate in the 100s, blood pressure in the 100s to 110s.  Lab work-up included CMP with sodium 134, glucose 113, calcium 8.3, albumin 2.2, AST stable at 59, alk phos 147, T. bili 2.5.  CBC with hemoglobin of 5.4 down from 6.1 on yesterday's labs and 8.8 at discharge from previous admission.  FOBT was positive in the ED.  Patient was typed and screened.  GI was consulted and stated they will see the patient.  Patient received a liter of fluids and 2 units of packed red blood cells have been ordered. ?

## 2022-02-24 NOTE — Consult Note (Signed)
Referring Provider: Dr. Imogene Burn ?Primary Care Physician:  Aggie Cosier, MD ?Primary Gastroenterologist:  UNASSIGNED ? ?Reason for Consultation:  Anemia ? ?HPI: Aaron Mcconnell is a 46 y.o. male with decompensated alcoholic cirrhosis admitted for severe anemia (reportedly Hgb 6.1 at his PCP office) and 5.4 in ER. Transfused 2 U PRBCs and post-Hgb (within an hour of transfusion per nursing was 6.9) He was told he was pale but denies dizziness, lightheadedness, melena, hematochezia, N/V. He was seen earlier this month as an inpt for bloody diarrhea and abdominal pain and EGD/colon on 01/26/22 and EGD showed Grade I esophageal varices, mild portal gastropathy and gastritis (neg for H. Pylori). Colonoscopy showed a small adenomatous polyp; sigmoid diverticulosis and internal hemorrhoids. He reports drinking a small amount of alcohol 2 weeks ago. Denies NSAIDs. Mother at bedside. ? ?Past Medical History:  ?Diagnosis Date  ? Cirrhosis of liver (HCC)   ? ETOH abuse   ? ? ?Past Surgical History:  ?Procedure Laterality Date  ? AMPUTATION Left 01/18/2018  ? Procedure: AMPUTATION BELOW KNEE LEFT;  Surgeon: Nadara Mustard, MD;  Location: [facility] OR;  Service: Orthopedics;  Laterality: Left;  ? AMPUTATION Left 01/23/2018  ? Procedure: LEFT ABOVE KNEE AMPUTATION;  Surgeon: Nadara Mustard, MD;  Location: [facility] OR;  Service: Orthopedics;  Laterality: Left;  ? APPLICATION OF WOUND VAC Left 01/07/2018  ? Procedure: APPLICATION OF WOUND VAC;  Surgeon: Roby Lofts, MD;  Location: [facility] OR;  Service: Orthopedics;  Laterality: Left;  ? APPLICATION OF WOUND VAC Left 01/09/2018  ? Procedure: APPLICATION OF WOUND VAC;  Surgeon: Nadara Mustard, MD;  Location: [facility] OR;  Service: Orthopedics;  Laterality: Left;  ? BIOPSY  01/26/2022  ? Procedure: BIOPSY;  Surgeon: Charlott Rakes, MD;  Location: [facility];  Service: Gastroenterology;;  ? COLONOSCOPY N/A 01/26/2022  ? Procedure: COLONOSCOPY;  Surgeon: Charlott Rakes, MD;  Location: [facility]COPY;  Service:  Gastroenterology;  Laterality: N/A;  ? ESOPHAGOGASTRODUODENOSCOPY N/A 01/26/2022  ? Procedure: ESOPHAGOGASTRODUODENOSCOPY (EGD);  Surgeon: Charlott Rakes, MD;  Location: [facility];  Service: Gastroenterology;  Laterality: N/A;  ? I & D EXTREMITY Left 01/05/2018  ? Procedure: IRRIGATION AND DEBRIDEMENT EXTREMITY;  Surgeon: Roby Lofts, MD;  Location: [facility] OR;  Service: Orthopedics;  Laterality: Left;  ? I & D EXTREMITY Left 01/07/2018  ? Procedure: IRRIGATION AND DEBRIDEMENT EXTREMITY;  Surgeon: Roby Lofts, MD;  Location: [facility] OR;  Service: Orthopedics;  Laterality: Left;  ? I & D EXTREMITY Left 01/09/2018  ? Procedure: IRRIGATION AND DEBRIDEMENT LEFT LEG;  Surgeon: Nadara Mustard, MD;  Location: [facility] OR;  Service: Orthopedics;  Laterality: Left;  ? I & D EXTREMITY Left 01/11/2018  ? Procedure: REPEAT IRRIGATION AND DEBRIDEMENT LEFT LEG, APPLY WOUND VAC;  Surgeon: Nadara Mustard, MD;  Location: [facility] OR;  Service: Orthopedics;  Laterality: Left;  ? I & D EXTREMITY Left 01/16/2018  ? Procedure: REPEAT IRRIGATION AND DEBRIDEMENT LEFT LEG, APPLY WOUND VAC;  Surgeon: Nadara Mustard, MD;  Location: [facility] OR;  Service: Orthopedics;  Laterality: Left;  ? POLYPECTOMY  01/26/2022  ? Procedure: POLYPECTOMY;  Surgeon: Charlott Rakes, MD;  Location: [facility];  Service: Gastroenterology;;  ? ? ?Prior to Admission medications   ?Medication Sig Start Date End Date Taking? Authorizing Provider  ?albuterol (VENTOLIN HFA) 108 (90 Base) MCG/ACT inhaler Inhale 2 puffs into the lungs every 4 (four) hours as needed for wheezing or shortness of breath. 09/30/21  Yes [provider]  ?Bismuth Subsalicylate (PEPTO-BISMOL  PO) Take 2 tablets by mouth every 4 (four) hours as needed (nausea/vomiting).   Yes [provider]  ?Cholecalciferol (VITAMIN D-3) 125 MCG (5000 UT) TABS Take 5,000 Units by mouth at bedtime.   Yes [provider]  ?clonazePAM (KLONOPIN) 0.5 MG tablet Take 0.5 mg by mouth 2 (two) times daily.  12/01/21  Yes [provider]  ?Fish Oil-Cholecalciferol (OMEGA-3 + VITAMIN D3 PO) Take 1 tablet by mouth at bedtime. Fish oil 1200 mg, Vitamin D3 2000 units   Yes [provider]  ?folic acid (FOLVITE) 1 MG tablet Take 1 tablet (1 mg total) by mouth daily. 01/27/22  Yes Meredeth Ide, MD  ?furosemide (LASIX) 20 MG tablet Take 20 mg by mouth every morning. 01/12/22  Yes [provider]  ?lactulose (CHRONULAC) 10 GM/15ML solution Take 10-20 g by mouth at bedtime as needed for moderate constipation (constipation).   Yes [provider]  ?Multiple Vitamin (MULTIVITAMIN WITH MINERALS) TABS tablet Take 1 tablet by mouth at bedtime.   Yes [provider]  ?Probiotic Product (ALIGN) 4 MG CAPS Take 4 mg by mouth at bedtime.   Yes [provider]  ?spironolactone (ALDACTONE) 25 MG tablet Take 25 mg by mouth every morning. 01/12/22  Yes [provider]  ?thiamine 100 MG tablet Take 1 tablet (100 mg total) by mouth daily. 01/27/22  Yes Meredeth Ide, MD  ? ? ?Scheduled Meds: ? clonazePAM  0.5 mg Oral QHS  ? furosemide  20 mg Oral q morning  ? multivitamin with minerals  1 tablet Oral QHS  ? sodium chloride flush  3 mL Intravenous Q12H  ? spironolactone  25 mg Oral q morning  ? thiamine  100 mg Oral Daily  ? ?Continuous Infusions: ? sodium chloride    ? ?PRN Meds:.acetaminophen **OR** acetaminophen, lactulose ? ?Allergies as of 02/23/2022 - Review Complete 02/23/2022  ?Allergen Reaction Noted  ? Gabapentin Other (See Comments) 01/23/2022  ? Neosporin [bacitracin-polymyxin b] Other (See Comments) 01/23/2022  ? Erythromycin Itching 01/05/2018  ? Oxycodone Itching 01/05/2018  ? ? ?Family History  ?Problem Relation Age of Onset  ? Diabetes Mellitus II Mother   ? ? ?Social History  ? ?Socioeconomic History  ? Marital status: Single  ?  Spouse name: Not on file  ? Number of children: Not on file  ? Years of education: Not on file  ? Highest education level: Not on file   ?Occupational History  ? Not on file  ?Tobacco Use  ? Smoking status: Never  ? Smokeless tobacco: Never  ?Vaping Use  ? Vaping Use: Never used  ?Substance and Sexual Activity  ? Alcohol use: Not Currently  ?  Alcohol/week: 3.0 standard drinks  ?  Types: 3 Shots of liquor per week  ? Drug use: No  ? Sexual activity: Not on file  ?Other Topics Concern  ? Not on file  ?Social History Narrative  ? Not on file  ? ?Social Determinants of Health  ? ?Financial Resource Strain: Not on file  ?Food Insecurity: Not on file  ?Transportation Needs: Not on file  ?Physical Activity: Not on file  ?Stress: Not on file  ?Social Connections: Not on file  ?Intimate Partner Violence: Not on file  ? ? ?Review of Systems: All negative except as stated above in HPI. ? ?Physical Exam: ?Vital signs: ?Vitals:  ? 02/24/22 0344 02/24/22 0612  ?BP: 107/72 110/63  ?Pulse: 81 80  ?Resp: 18 20  ?Temp: 98.4 ?F (36.9 ?C)  98.1 ?F (36.7 ?C)  ?SpO2: 100% 100%  ? ?Last BM Date : 02/23/22 ?General:   Lethargic, pale, Well-developed, well-nourished, pleasant and cooperative in NAD ?Head: normocephalic, atraumatic ?Eyes: anicteric sclera ?ENT: oropharynx clear ?Neck: supple, nontender ?Lungs:  Clear throughout to auscultation.   No wheezes, crackles, or rhonchi. No acute distress. ?Heart:  Regular rate and rhythm; no murmurs, clicks, rubs,  or gallops. ?Abdomen: soft, nontender, nondistended, +BS  ?Rectal:  Deferred ?Ext: no edema in RLE; LLE BKA ? ?GI:  ?Lab Results: ?Recent Labs  ?  02/23/22 ?1601 02/24/22 ?0448  ?WBC 10.4 7.5  ?HGB 5.4* 6.9*  ?HCT 19.2* 22.4*  ?PLT 333 286  ? ?BMET ?Recent Labs  ?  02/23/22 ?1601 02/24/22 ?0448  ?NA 134* 136  ?K 3.6 3.4*  ?CL 103 105  ?CO2 24 24  ?GLUCOSE 113* 90  ?BUN 7 6  ?CREATININE 0.65 0.57*  ?CALCIUM 8.3* 7.9*  ? ?LFT ?Recent Labs  ?  02/24/22 ?0448  ?PROT 5.9*  ?ALBUMIN 2.0*  ?AST 53*  ?ALT 15  ?ALKPHOS 120  ?BILITOT 3.4*  ? ?PT/INR ?No results for input(s): LABPROT, INR in the last 72  hours. ? ? ?Studies/Results: ?No results found. ? ?Impression/Plan: ?Severe anemia with high normal MCV - I do not think his anemia is due to a GI blood loss source but likely due to his portal HTN and splenic loss and/or bone marrow source. I

## 2022-02-24 NOTE — Assessment & Plan Note (Signed)
Gi think that anemia is due to splenic sequestration vs portal hypertension. I agree with this thought process. Pt without any CT imaging within [facility]. He thinks he may have had some imaging in [place], [place] but not sure. Will get CT abd/pelvis today to evaluate his liver and spleen.  Don't think that inpatient hemtology consult is needed at this point.  Would recommend another HgB next week(Wednesday) in PCP office. If drops again, PCP can arrange for outpatient PRBC transfusion.  If he continues to need PRBC transfusion and has splenomegaly. Then I think at that point a hematology +/- general surgery consult would be in order.   Not sure what hematology opinion at this juncture would do. ?

## 2022-02-25 DIAGNOSIS — Z89612 Acquired absence of left leg above knee: Secondary | ICD-10-CM | POA: Diagnosis not present

## 2022-02-25 DIAGNOSIS — F1010 Alcohol abuse, uncomplicated: Secondary | ICD-10-CM | POA: Diagnosis not present

## 2022-02-25 DIAGNOSIS — D649 Anemia, unspecified: Secondary | ICD-10-CM | POA: Diagnosis not present

## 2022-02-25 DIAGNOSIS — K7031 Alcoholic cirrhosis of liver with ascites: Secondary | ICD-10-CM | POA: Diagnosis not present

## 2022-02-25 LAB — CBC WITH DIFFERENTIAL/PLATELET
Abs Immature Granulocytes: 0.04 10*3/uL (ref 0.00–0.07)
Basophils Absolute: 0.1 10*3/uL (ref 0.0–0.1)
Basophils Relative: 1 %
Eosinophils Absolute: 0.1 10*3/uL (ref 0.0–0.5)
Eosinophils Relative: 2 %
HCT: 25.4 % — ABNORMAL LOW (ref 39.0–52.0)
Hemoglobin: 7.4 g/dL — ABNORMAL LOW (ref 13.0–17.0)
Immature Granulocytes: 1 %
Lymphocytes Relative: 20 %
Lymphs Abs: 1.4 10*3/uL (ref 0.7–4.0)
MCH: 28.7 pg (ref 26.0–34.0)
MCHC: 29.1 g/dL — ABNORMAL LOW (ref 30.0–36.0)
MCV: 98.4 fL (ref 80.0–100.0)
Monocytes Absolute: 0.6 10*3/uL (ref 0.1–1.0)
Monocytes Relative: 9 %
Neutro Abs: 4.7 10*3/uL (ref 1.7–7.7)
Neutrophils Relative %: 67 %
Platelets: 289 10*3/uL (ref 150–400)
RBC: 2.58 MIL/uL — ABNORMAL LOW (ref 4.22–5.81)
RDW: 19.1 % — ABNORMAL HIGH (ref 11.5–15.5)
WBC: 6.9 10*3/uL (ref 4.0–10.5)
nRBC: 0 % (ref 0.0–0.2)

## 2022-02-25 LAB — COMPREHENSIVE METABOLIC PANEL
ALT: 17 U/L (ref 0–44)
AST: 52 U/L — ABNORMAL HIGH (ref 15–41)
Albumin: 2 g/dL — ABNORMAL LOW (ref 3.5–5.0)
Alkaline Phosphatase: 129 U/L — ABNORMAL HIGH (ref 38–126)
Anion gap: 6 (ref 5–15)
BUN: 5 mg/dL — ABNORMAL LOW (ref 6–20)
CO2: 24 mmol/L (ref 22–32)
Calcium: 8.1 mg/dL — ABNORMAL LOW (ref 8.9–10.3)
Chloride: 106 mmol/L (ref 98–111)
Creatinine, Ser: 0.52 mg/dL — ABNORMAL LOW (ref 0.61–1.24)
GFR, Estimated: >60 mL/min (ref >60)
Glucose, Bld: 98 mg/dL (ref 70–99)
Potassium: 3.8 mmol/L (ref 3.5–5.1)
Sodium: 136 mmol/L (ref 135–145)
Total Bilirubin: 3 mg/dL — ABNORMAL HIGH (ref 0.3–1.2)
Total Protein: 6.2 g/dL — ABNORMAL LOW (ref 6.5–8.1)

## 2022-02-25 NOTE — Progress Notes (Signed)
Pt and mother given and explained discharge instructions. All questions answered. IV removed. Tele removed. Pt dressed in personal clothing and taken to the main entrance via wheelchair.  ?

## 2022-02-25 NOTE — Discharge Summary (Signed)
?Discharge Summary ? ?Aaron Mcconnell NKN:[id number] DOB: May 24, 1976 ? ?PCP: Kennieth Rad, MD ? ?Admit date: 02/23/2022 ?Discharge date: 02/25/2022 ? ?Time spent: 84mns, more than 50% time spent on coordination of care.  ? ?Recommendations for Outpatient Follow-up:  ?F/u with PCP within a week  for hospital discharge follow up, repeat cbc/bmp at follow up. Pcp to refer to hematology for anemia evaluation  ?F/u with GI Dr SMichail Sermon (patient's preference) for cirrhosis, ascites, partial protal vein thrombosis, gallstone ? ?  ? ?Discharge Diagnoses:  ?Active Hospital Problems  ? Diagnosis Date Noted  ? GI bleed 01/23/2022  ?  Priority: High  ? Normocytic anemia 02/24/2022  ?  Priority: Medium   ? Alcoholic cirrhosis of liver with ascites ([facility] 01/23/2022  ?  Priority: Medium   ? S/P AKA (above knee amputation) unilateral, left ([facility] 02/24/2022  ?  Priority: Low  ? Chronic alcohol abuse 01/14/2018  ?  Priority: Low  ?  ?Resolved Hospital Problems  ?No resolved problems to display.  ? ? ?Discharge Condition: stable ? ?Diet recommendation: low salt diet ? ?Filed Weights  ? 02/23/22 2235 02/24/22 0500 02/25/22 0500  ?Weight: 99.3 kg 99.3 kg 99.1 kg  ? ? ?History of present illness: ( per admitting Md Dr MTrilby Drummer ?PCP: DKennieth Rad MD  ?  ?Patient coming from: Home ?  ?Chief Complaint: Abnormal labs ?  ?HPI: Aaron Koeppenis a 46y.o. male with medical history significant of alcohol use, cirrhosis, necrotizing fasciitis status post left BKA presenting with abnormal lab, low hemoglobin. ? ?Patient presenting after PCP checked his hemoglobin and found to be 6.1.  Was referred to the ED at that time.  Was recently admitted at the beginning of April found to have colitis and new cirrhosis as well as some GI bleeding.  Upper endoscopy at that time showed gastritis, varices, portal gastropathy but no active bleed, lower endoscopy also did not show source of bleeding but noted to have an adequate prep and did demonstrate  internal hemorrhoids and diverticulosis as well as 1 polyp. ? ?He states he has not been drinking for the past 13 days.  He does report some weakness and shortness of breath as well but states this has been going on since his discharge in April.  He states he has not noticed any dark or bloody stools.  He denies any fevers, chills, chest pain, abdominal pain, constipation, diarrhea, nausea, vomiting. ?  ?ED Course: Vital signs in the ED significant for heart rate in the 100s, blood pressure in the 100s to 110s.  Lab work-up included CMP with sodium 134, glucose 113, calcium 8.3, albumin 2.2, AST stable at 59, alk phos 147, T. bili 2.5.  CBC with hemoglobin of 5.4 down from 6.1 on yesterday's labs and 8.8 at discharge from previous admission.  FOBT was positive in the ED.  Patient was typed and screened.  GI was consulted and stated they will see the patient.  Patient received a liter of fluids and 2 units of packed red blood cells have been ordered. ?  ? ?Hospital Course:  ?Principal Problem: ?  GI bleed ?Active Problems: ?  Alcoholic cirrhosis of liver with ascites ([facility] ?  Normocytic anemia ?  Chronic alcohol abuse ?  S/P AKA (above knee amputation) unilateral, left ([facility] ? ? ?Assessment and Plan: ? ?  ?Normocytic anemia ?S/p 2 unit PRBC. Repeat HgB 7.2.  Was 5.4 prior to transfusion. Has had expected response to 2 units. ?Denies active bleed,  FOBT +  ?Seen by GI Dr Janan Ridge think that anemia is due to splenic sequestration vs portal hypertension, They recommend no need for repeat endo ,recommend outpatient f/u with GI and hematology referral ?Patient is to follow up with pcp next week to repeat cbcI, f drops again, PCP can arrange for outpatient PRBC transfusion. ?  ?Alcoholic cirrhosis of liver with ascites/partial portal vein thrombosis with some extension into the superior mesenteric ?vein. , mild splenomegaly ([place]) ?Currently denies ab pain, no n/v ?He is not a candidate for anticoagulation unfortunately  due to severe anemia ?Reports ascites is decreasing compare to before ?He desires to go home , continue lasix/spironolactone ?F/u with pcp and GI ? ?Gallstone, Asymptomatic ?He desires to go home ?F/u outpatient ?Patient is aware to call his doctor or come back to the hospital if he has ab pain, n/v ?  ?S/P AKA (above knee amputation) unilateral, left ([place]) ?Chronic. ?  ?Chronic alcohol abuse ?Pt states he has stopped alcohol 15days ago. Confirmed by his mother. ?  ? ?Discharge Exam: ?BP 111/75   Pulse 90   Temp 98.2 ?F (36.8 ?C) (Oral)   Resp 18   Ht 6' 5"  (1.956 m)   Wt 99.1 kg   SpO2 98%   BMI 25.91 kg/m?  ? ?General: NAD, left AKA ?Cardiovascular: RRR, no edema ?Respiratory: normal respiratory effort ? ? ? ?Discharge Instructions   ? ? Diet - low sodium heart healthy   Complete by: As directed ?  ? Increase activity slowly   Complete by: As directed ?  ? ?  ? ?Allergies as of 02/25/2022   ? ?   Reactions  ? Gabapentin Other (See Comments)  ? Bad dreams/skin crawling  ? Neosporin [bacitracin-polymyxin B] Other (See Comments)  ? Arm turned completely pinkish/red  ? Erythromycin Itching  ? Oxycodone Itching  ? ?  ? ?  ?Medication List  ?  ? ?TAKE these medications   ? ?albuterol 108 (90 Base) MCG/ACT inhaler ?Commonly known as: VENTOLIN HFA ?Inhale 2 puffs into the lungs every 4 (four) hours as needed for wheezing or shortness of breath. ?  ?Align 4 MG Caps ?Take 4 mg by mouth at bedtime. ?  ?clonazePAM 0.5 MG tablet ?Commonly known as: KLONOPIN ?Take 0.5 mg by mouth 2 (two) times daily. ?  ?folic acid 1 MG tablet ?Commonly known as: FOLVITE ?Take 1 tablet (1 mg total) by mouth daily. ?  ?furosemide 20 MG tablet ?Commonly known as: LASIX ?Take 20 mg by mouth every morning. ?  ?lactulose 10 GM/15ML solution ?Commonly known as: [place] ?Take 10-20 g by mouth at bedtime as needed for moderate constipation (constipation). ?  ?multivitamin with minerals Tabs tablet ?Take 1 tablet by mouth at bedtime. ?  ?OMEGA-3  + VITAMIN D3 PO ?Take 1 tablet by mouth at bedtime. Fish oil 1200 mg, Vitamin D3 2000 units ?  ?PEPTO-BISMOL PO ?Take 2 tablets by mouth every 4 (four) hours as needed (nausea/vomiting). ?  ?spironolactone 25 MG tablet ?Commonly known as: ALDACTONE ?Take 25 mg by mouth every morning. ?  ?thiamine 100 MG tablet ?Take 1 tablet (100 mg total) by mouth daily. ?  ?Vitamin D-3 125 MCG (5000 UT) Tabs ?Take 5,000 Units by mouth at bedtime. ?  ? ?  ? ?Allergies  ?Allergen Reactions  ? Gabapentin Other (See Comments)  ?  Bad dreams/skin crawling  ? Neosporin [Bacitracin-Polymyxin B] Other (See Comments)  ?  Arm turned completely pinkish/red  ? Erythromycin Itching  ?  Oxycodone Itching  ? ? Follow-up Information   ? ? Kennieth Rad, MD Follow up on 03/01/2022.   ?Specialties: Internal Medicine, Infectious Diseases ?Why: hospital discharge follow up, repeat cbc/bmp at hospital discharge follow ?pcp to refer to hematology for anemia ?Contact information: ?[facility] ?[place] [address] ?[phone] ? ? ?  ?  ? ? Wilford Corner, MD Follow up.   ?Specialty: Gastroenterology ?Why: for GI bleed and cirrrhosis, ascites,partial portal vein thrombosis, gallstone ?please call your doctor or return to the ED if you have fever, confusion or ab pain. ?Contact information: ?[address] ?[place] [address] ?[phone] ? ? ?  ?  ? ?  ?  ? ?  ? ? ? ?The results of significant diagnostics from this hospitalization (including imaging, microbiology, ancillary and laboratory) are listed below for reference.   ? ?Significant Diagnostic Studies: ?CT ABDOMEN PELVIS W CONTRAST ? ?Result Date: 02/24/2022 ?CLINICAL DATA:  Ascites. New diagnosis of cirrhosis. Question splenomegaly. EXAM: CT ABDOMEN AND PELVIS WITH CONTRAST TECHNIQUE: Multidetector CT imaging of the abdomen and pelvis was performed using the standard protocol following bolus administration of intravenous contrast. RADIATION DOSE  REDUCTION: This exam was performed according to the departmental dose-optimization program which includes automated exposure control, adjustment of the mA and/or kV according to patient size and/or use of iterative re

## 2022-05-29 ENCOUNTER — Other Ambulatory Visit: Payer: Self-pay

## 2022-05-29 ENCOUNTER — Encounter (HOSPITAL_COMMUNITY): Payer: Self-pay

## 2022-05-29 ENCOUNTER — Emergency Department (HOSPITAL_COMMUNITY)
Admission: EM | Admit: 2022-05-29 | Discharge: 2022-05-30 | Discharge status: Against Medical Advice | Payer: PRIVATE HEALTH INSURANCE | Attending: Student | Admitting: Student

## 2022-05-29 DIAGNOSIS — Z5321 Procedure and treatment not carried out due to patient leaving prior to being seen by health care provider: Secondary | ICD-10-CM | POA: Insufficient documentation

## 2022-05-29 DIAGNOSIS — F32A Depression, unspecified: Secondary | ICD-10-CM | POA: Diagnosis not present

## 2022-05-29 DIAGNOSIS — F1010 Alcohol abuse, uncomplicated: Secondary | ICD-10-CM | POA: Diagnosis not present

## 2022-05-29 DIAGNOSIS — Z046 Encounter for general psychiatric examination, requested by authority: Secondary | ICD-10-CM | POA: Diagnosis present

## 2022-05-29 DIAGNOSIS — Z20822 Contact with and (suspected) exposure to covid-19: Secondary | ICD-10-CM | POA: Diagnosis not present

## 2022-05-29 DIAGNOSIS — Y908 Blood alcohol level of 240 mg/100 ml or more: Secondary | ICD-10-CM | POA: Diagnosis not present

## 2022-05-29 LAB — CBC WITH DIFFERENTIAL/PLATELET
Abs Immature Granulocytes: 0.01 10*3/uL (ref 0.00–0.07)
Basophils Absolute: 0.1 10*3/uL (ref 0.0–0.1)
Basophils Relative: 2 %
Eosinophils Absolute: 0.1 10*3/uL (ref 0.0–0.5)
Eosinophils Relative: 1 %
HCT: 39.9 % (ref 39.0–52.0)
Hemoglobin: 12 g/dL — ABNORMAL LOW (ref 13.0–17.0)
Immature Granulocytes: 0 %
Lymphocytes Relative: 43 %
Lymphs Abs: 3 10*3/uL (ref 0.7–4.0)
MCH: 23.3 pg — ABNORMAL LOW (ref 26.0–34.0)
MCHC: 30.1 g/dL (ref 30.0–36.0)
MCV: 77.5 fL — ABNORMAL LOW (ref 80.0–100.0)
Monocytes Absolute: 0.4 10*3/uL (ref 0.1–1.0)
Monocytes Relative: 6 %
Neutro Abs: 3.4 10*3/uL (ref 1.7–7.7)
Neutrophils Relative %: 48 %
Platelets: 92 10*3/uL — ABNORMAL LOW (ref 150–400)
RBC: 5.15 MIL/uL (ref 4.22–5.81)
RDW: 23.3 % — ABNORMAL HIGH (ref 11.5–15.5)
WBC: 7.1 10*3/uL (ref 4.0–10.5)
nRBC: 0 % (ref 0.0–0.2)

## 2022-05-29 LAB — COMPREHENSIVE METABOLIC PANEL
ALT: 22 U/L (ref 0–44)
AST: 49 U/L — ABNORMAL HIGH (ref 15–41)
Albumin: 4.3 g/dL (ref 3.5–5.0)
Alkaline Phosphatase: 80 U/L (ref 38–126)
Anion gap: 14 (ref 5–15)
BUN: 9 mg/dL (ref 6–20)
CO2: 25 mmol/L (ref 22–32)
Calcium: 9.6 mg/dL (ref 8.9–10.3)
Chloride: 105 mmol/L (ref 98–111)
Creatinine, Ser: 0.65 mg/dL (ref 0.61–1.24)
GFR, Estimated: >60 mL/min (ref >60)
Glucose, Bld: 111 mg/dL — ABNORMAL HIGH (ref 70–99)
Potassium: 3.8 mmol/L (ref 3.5–5.1)
Sodium: 144 mmol/L (ref 135–145)
Total Bilirubin: 1.5 mg/dL — ABNORMAL HIGH (ref 0.3–1.2)
Total Protein: 9 g/dL — ABNORMAL HIGH (ref 6.5–8.1)

## 2022-05-29 LAB — RAPID URINE DRUG SCREEN, HOSP PERFORMED
Amphetamines: NOT DETECTED
Barbiturates: NOT DETECTED
Benzodiazepines: NOT DETECTED
Cocaine: NOT DETECTED
Opiates: NOT DETECTED
Tetrahydrocannabinol: NOT DETECTED

## 2022-05-29 LAB — ETHANOL: Alcohol, Ethyl (B): 350 mg/dL (ref <10)

## 2022-05-29 LAB — RESP PANEL BY RT-PCR (FLU A&B, COVID) ARPGX2
Influenza A by PCR: NEGATIVE
Influenza B by PCR: NEGATIVE
SARS Coronavirus 2 by RT PCR: NEGATIVE

## 2022-05-29 NOTE — ED Provider Triage Note (Signed)
Emergency Medicine Provider Triage Evaluation Note  Aaron Mcconnell , a 46 y.o. male  was evaluated in triage.  Pt complains of severe depression.  Patient lost his pet a few months ago and has been having increased depression since that time.  Patient was admitted to the hospital in May of this year due to rectal bleeding causing severe acute blood loss anemia.  Patient has required transfusions in the past.  Currently patient is receiving iron transfusions for iron deficiency anemia.  Chief complaint today is depression.  He denies SI, hallucinations, HI  Review of Systems  Positive: Depression Negative: SI, HI  Physical Exam  BP (!) 147/95 (BP Location: Right Arm)   Pulse (!) 105   Temp 98.3 F (36.8 C) (Oral)   Resp 16   Ht 6\' 5"  (1.956 m)   Wt 99.1 kg   SpO2 95%   BMI 25.91 kg/m  Gen:   Awake, no distress   Resp:  Normal effort  MSK:   Moves extremities without difficulty  Other:    Medical Decision Making  Medically screening exam initiated at 8:35 PM.  Appropriate orders placed.  Duanne Duchesne was informed that the remainder of the evaluation will be completed by another provider, this initial triage assessment does not replace that evaluation, and the importance of remaining in the ED until their evaluation is complete.     Harriet Pho 05/29/22 2038

## 2022-05-29 NOTE — ED Triage Notes (Signed)
Pt here for psych evaluation stating "sometimes I wanna go to sleep and not wake up". Pt denies active SI. Pt endorses heavy drinking and multiple hospital visits. Pt states that he has severe depression and he " feels nuts". Pt not giving a lot of information about why he is here. Pt mother states that he lost his leg 4 years ago and lost is dog recently. When mother explained this, pt began crying.

## 2022-05-30 NOTE — ED Notes (Signed)
Pt at this time did not respond to name call for the second time.

## 2022-05-30 NOTE — ED Notes (Signed)
Did not answer to name call for vitals to be checked

## 2023-02-21 ENCOUNTER — Encounter (HOSPITAL_COMMUNITY): Payer: Self-pay

## 2024-08-23 DEATH — deceased
